# Patient Record
Sex: Female | Born: 1945 | Race: White | Hispanic: No | Marital: Married | Smoking: Never smoker
Health system: Southern US, Community
[De-identification: ages and names within clinical notes are randomized; demographics above are authoritative.]

## PROBLEM LIST (undated history)

## (undated) DIAGNOSIS — I4891 Unspecified atrial fibrillation: Secondary | ICD-10-CM

## (undated) DIAGNOSIS — I499 Cardiac arrhythmia, unspecified: Secondary | ICD-10-CM

## (undated) DIAGNOSIS — J189 Pneumonia, unspecified organism: Secondary | ICD-10-CM

## (undated) DIAGNOSIS — Z8679 Personal history of other diseases of the circulatory system: Secondary | ICD-10-CM

## (undated) DIAGNOSIS — Z9889 Other specified postprocedural states: Secondary | ICD-10-CM

## (undated) DIAGNOSIS — E785 Hyperlipidemia, unspecified: Secondary | ICD-10-CM

## (undated) HISTORY — DX: Unspecified atrial fibrillation: I48.91

## (undated) HISTORY — DX: Personal history of other diseases of the circulatory system: Z86.79

## (undated) HISTORY — PX: LOOP RECORDER INSERTION: EP1214

## (undated) HISTORY — PX: CHOLECYSTECTOMY: SHX55

## (undated) HISTORY — DX: Hyperlipidemia, unspecified: E78.5

## (undated) HISTORY — DX: Other specified postprocedural states: Z98.890

## (undated) HISTORY — PX: PANCREATICODUODENECTOMY: SUR1000

---

## 2005-01-22 ENCOUNTER — Encounter: Payer: Self-pay | Admitting: Cardiology

## 2009-01-02 ENCOUNTER — Encounter: Payer: Self-pay | Admitting: Cardiology

## 2009-01-09 ENCOUNTER — Encounter: Payer: Self-pay | Admitting: Cardiology

## 2009-04-18 ENCOUNTER — Encounter: Payer: Self-pay | Admitting: Cardiology

## 2009-04-18 HISTORY — PX: CARDIAC CATHETERIZATION: SHX172

## 2009-07-30 ENCOUNTER — Encounter: Payer: Self-pay | Admitting: Cardiology

## 2009-07-31 ENCOUNTER — Encounter: Payer: Self-pay | Admitting: Cardiology

## 2009-08-14 ENCOUNTER — Encounter: Payer: Self-pay | Admitting: Cardiology

## 2009-08-18 ENCOUNTER — Telehealth (INDEPENDENT_AMBULATORY_CARE_PROVIDER_SITE_OTHER): Payer: Self-pay | Admitting: *Deleted

## 2009-08-20 DIAGNOSIS — R0602 Shortness of breath: Secondary | ICD-10-CM

## 2009-08-20 DIAGNOSIS — I4891 Unspecified atrial fibrillation: Secondary | ICD-10-CM | POA: Insufficient documentation

## 2009-08-21 ENCOUNTER — Ambulatory Visit: Payer: Self-pay | Admitting: Cardiology

## 2009-08-21 DIAGNOSIS — E785 Hyperlipidemia, unspecified: Secondary | ICD-10-CM

## 2009-08-29 ENCOUNTER — Ambulatory Visit: Payer: Self-pay | Admitting: Internal Medicine

## 2009-08-29 DIAGNOSIS — I4892 Unspecified atrial flutter: Secondary | ICD-10-CM

## 2009-09-03 ENCOUNTER — Encounter: Payer: Self-pay | Admitting: Internal Medicine

## 2009-09-15 ENCOUNTER — Telehealth: Payer: Self-pay | Admitting: Internal Medicine

## 2009-09-25 ENCOUNTER — Telehealth: Payer: Self-pay | Admitting: Internal Medicine

## 2009-10-22 ENCOUNTER — Telehealth: Payer: Self-pay | Admitting: Internal Medicine

## 2009-11-20 ENCOUNTER — Ambulatory Visit: Payer: Self-pay | Admitting: Internal Medicine

## 2009-12-02 ENCOUNTER — Ambulatory Visit: Payer: Self-pay | Admitting: Cardiology

## 2009-12-09 ENCOUNTER — Ambulatory Visit: Payer: Self-pay | Admitting: Cardiology

## 2009-12-16 ENCOUNTER — Ambulatory Visit: Payer: Self-pay | Admitting: Cardiology

## 2009-12-23 ENCOUNTER — Ambulatory Visit: Payer: Self-pay | Admitting: Cardiology

## 2009-12-23 LAB — CONVERTED CEMR LAB: POC INR: 3.8

## 2009-12-26 ENCOUNTER — Encounter: Payer: Self-pay | Admitting: Internal Medicine

## 2009-12-29 ENCOUNTER — Telehealth: Payer: Self-pay | Admitting: Internal Medicine

## 2009-12-30 ENCOUNTER — Encounter: Payer: Self-pay | Admitting: Internal Medicine

## 2009-12-30 ENCOUNTER — Ambulatory Visit: Payer: Self-pay | Admitting: Cardiology

## 2010-01-05 ENCOUNTER — Other Ambulatory Visit: Payer: Self-pay | Admitting: Internal Medicine

## 2010-01-05 ENCOUNTER — Ambulatory Visit: Payer: Self-pay | Admitting: Internal Medicine

## 2010-01-05 ENCOUNTER — Encounter: Payer: Self-pay | Admitting: Internal Medicine

## 2010-01-16 ENCOUNTER — Ambulatory Visit: Payer: Self-pay | Admitting: Cardiology

## 2010-01-23 ENCOUNTER — Ambulatory Visit: Payer: Self-pay | Admitting: Cardiology

## 2010-01-23 LAB — CONVERTED CEMR LAB: POC INR: 2.8

## 2010-01-30 ENCOUNTER — Encounter (INDEPENDENT_AMBULATORY_CARE_PROVIDER_SITE_OTHER): Payer: Self-pay | Admitting: *Deleted

## 2010-01-30 ENCOUNTER — Ambulatory Visit: Payer: Self-pay | Admitting: Cardiology

## 2010-01-30 LAB — CONVERTED CEMR LAB: POC INR: 3

## 2010-02-06 ENCOUNTER — Ambulatory Visit: Payer: Self-pay | Admitting: Cardiology

## 2010-02-09 ENCOUNTER — Encounter: Payer: Self-pay | Admitting: Internal Medicine

## 2010-02-12 ENCOUNTER — Encounter: Payer: Self-pay | Admitting: Internal Medicine

## 2010-02-12 ENCOUNTER — Ambulatory Visit: Payer: Self-pay | Admitting: Cardiology

## 2010-02-12 LAB — CONVERTED CEMR LAB: POC INR: 3.1

## 2010-02-16 ENCOUNTER — Telehealth: Payer: Self-pay | Admitting: Internal Medicine

## 2010-02-16 LAB — CONVERTED CEMR LAB
BUN: 10 mg/dL (ref 6–23)
Creatinine, Ser: 0.84 mg/dL (ref 0.40–1.20)
HCT: 36.3 % (ref 36.0–46.0)
Platelets: 338 10*3/uL (ref 150–400)
Potassium: 4.5 meq/L (ref 3.5–5.3)
RDW: 13.8 % (ref 11.5–15.5)

## 2010-02-20 ENCOUNTER — Inpatient Hospital Stay (HOSPITAL_COMMUNITY): Admission: RE | Admit: 2010-02-20 | Discharge: 2010-02-22 | Payer: Self-pay | Admitting: Internal Medicine

## 2010-02-20 ENCOUNTER — Ambulatory Visit: Payer: Self-pay | Admitting: Cardiology

## 2010-02-20 HISTORY — PX: OTHER SURGICAL HISTORY: SHX169

## 2010-02-21 DIAGNOSIS — Z9889 Other specified postprocedural states: Secondary | ICD-10-CM

## 2010-02-21 DIAGNOSIS — Z8679 Personal history of other diseases of the circulatory system: Secondary | ICD-10-CM

## 2010-02-21 HISTORY — DX: Personal history of other diseases of the circulatory system: Z98.890

## 2010-02-21 HISTORY — DX: Personal history of other diseases of the circulatory system: Z86.79

## 2010-02-27 ENCOUNTER — Ambulatory Visit: Payer: Self-pay | Admitting: Cardiology

## 2010-02-27 LAB — CONVERTED CEMR LAB: POC INR: 1.6

## 2010-03-10 ENCOUNTER — Ambulatory Visit: Payer: Self-pay | Admitting: Cardiology

## 2010-03-10 LAB — CONVERTED CEMR LAB: POC INR: 2.4

## 2010-03-20 ENCOUNTER — Ambulatory Visit: Payer: Self-pay | Admitting: Cardiology

## 2010-04-03 ENCOUNTER — Ambulatory Visit: Payer: Self-pay | Admitting: Cardiology

## 2010-04-06 ENCOUNTER — Telehealth (INDEPENDENT_AMBULATORY_CARE_PROVIDER_SITE_OTHER): Payer: Self-pay | Admitting: *Deleted

## 2010-04-08 ENCOUNTER — Telehealth (INDEPENDENT_AMBULATORY_CARE_PROVIDER_SITE_OTHER): Payer: Self-pay | Admitting: *Deleted

## 2010-04-22 ENCOUNTER — Ambulatory Visit: Payer: Self-pay | Admitting: Internal Medicine

## 2010-05-05 ENCOUNTER — Ambulatory Visit: Payer: Self-pay | Admitting: Cardiology

## 2010-05-05 LAB — CONVERTED CEMR LAB: POC INR: 2.7

## 2010-06-02 ENCOUNTER — Ambulatory Visit: Admission: RE | Admit: 2010-06-02 | Discharge: 2010-06-02 | Payer: Self-pay | Source: Home / Self Care

## 2010-06-02 LAB — CONVERTED CEMR LAB: POC INR: 2.9

## 2010-06-30 ENCOUNTER — Ambulatory Visit: Admission: RE | Admit: 2010-06-30 | Discharge: 2010-06-30 | Payer: Self-pay | Source: Home / Self Care

## 2010-06-30 NOTE — Medication Information (Signed)
Summary: new coumadin per [facility]  --agh  Anticoagulant Therapy  Managed by: Vashti Hey, RN PCP: Radene Gunning (Waylan Rocher) Supervising MD: Antoine Poche MD, Fayrene Fearing Indication 1: Atrial Fibrillation Lab Used: [facility] [place] Site: [place] INR POC 3.8  Dietary changes: no    Health status changes: no    Bleeding/hemorrhagic complications: no    Recent/future hospitalizations: yes       Details: [place] A fib ablation 01/06/10 by Dr Johney Frame  Any changes in medication regimen? yes       Details: Has been on coumadin since last Sept.  Has been checked at [facility] in [place] but will now be managed by [place]  Recent/future dental: no  Any missed doses?: no       Is patient compliant with meds? yes       Allergies: No Known Drug Allergies  Anticoagulation Management History:      The patient is taking warfarin and comes in today for a routine follow up visit.  Negative risk factors for bleeding include an age less than 9 years old.  The bleeding index is 'low risk'.  Negative CHADS2 values include Age > 80 years old.  Anticoagulation responsible provider: Antoine Poche MD, Fayrene Fearing.  INR POC: 3.8.  Cuvette Lot#: [id number].    Anticoagulation Management Assessment/Plan:      The patient's current anticoagulation dose is Coumadin 5 mg tabs: Take 1 tablet by mouth once a day.  The target INR is 2.0-3.0.  The next INR is due 12/09/2009.  Anticoagulation instructions were given to patient.  Results were reviewed/authorized by Vashti Hey, RN.  She was notified by Vashti Hey RN.        Coagulation management information includes: Pending abaltion by Dr Johney Frame on 01/06/10    Prior INR on 11/21/09 in [place] was 2.5.  Current Anticoagulation Instructions: INR 3.8 Decrease coumadin to 5mg  once daily except 2.5mg  on Tuesdays and Fridays Increase greens

## 2010-06-30 NOTE — Medication Information (Signed)
Summary: ccr-lr  Anticoagulant Therapy  Managed by: Vashti Hey, RN PCP: Radene Gunning (Waylan Rocher) Supervising MD: Myrtis Ser MD, Tinnie Gens Indication 1: Atrial Fibrillation Lab Used: [facility] [place] Site: [place] INR POC 3.6  Dietary changes: no    Health status changes: no    Bleeding/hemorrhagic complications: no    Recent/future hospitalizations: no    Any changes in medication regimen? no    Recent/future dental: no  Any missed doses?: no       Is patient compliant with meds? yes       Allergies: No Known Drug Allergies  Anticoagulation Management History:      The patient is taking warfarin and comes in today for a routine follow up visit.  Negative risk factors for bleeding include an age less than 50 years old.  The bleeding index is 'low risk'.  Negative CHADS2 values include Age > 47 years old.  Anticoagulation responsible provider: Myrtis Ser MD, Tinnie Gens.  INR POC: 3.6.  Cuvette Lot#: [id number].    Anticoagulation Management Assessment/Plan:      The patient's current anticoagulation dose is Coumadin 5 mg tabs: Take 1 tablet by mouth once a day.  The target INR is 2.0-3.0.  The next INR is due 02/12/2010.  Anticoagulation instructions were given to patient.  Results were reviewed/authorized by Vashti Hey, RN.  She was notified by Vashti Hey RN.         Prior Anticoagulation Instructions: INR 3.0 Continue coumadin 5mg  once daily except 2.5mg  on Mondays and Fridays  Current Anticoagulation Instructions: INR 3.6 Hold coumadin tonight then resume 5mg  once daily except 2.5mg  on Mondays and Fridays

## 2010-06-30 NOTE — Medication Information (Signed)
Summary: ccr-lr  Anticoagulant Therapy  Managed by: Vashti Hey, RN PCP: Radene Gunning (Waylan Rocher) Supervising MD: Andee Lineman MD, Michelle Piper Indication 1: Atrial Fibrillation Lab Used: [facility]  Site: [place] INR POC 2.2  Dietary changes: no    Health status changes: no    Bleeding/hemorrhagic complications: no    Recent/future hospitalizations: no    Any changes in medication regimen? no    Recent/future dental: no  Any missed doses?: no       Is patient compliant with meds? yes       Allergies: No Known Drug Allergies  Anticoagulation Management History:      The patient is taking warfarin and comes in today for a routine follow up visit.  Negative risk factors for bleeding include an age less than 79 years old.  The bleeding index is 'low risk'.  Negative CHADS2 values include Age > 60 years old.  Anticoagulation responsible Verlie Liotta: Andee Lineman MD, Michelle Piper.  INR POC: 2.2.  Cuvette Lot#: [id number].    Anticoagulation Management Assessment/Plan:      The patient's current anticoagulation dose is Coumadin 5 mg tabs: Take 1 tablet by mouth once a day.  The target INR is 2.0-3.0.  The next INR is due 05/05/2010.  Anticoagulation instructions were given to patient.  Results were reviewed/authorized by Vashti Hey, RN.  She was notified by Vashti Hey RN.         Prior Anticoagulation Instructions: INR 3.0 Continue coumadin 5mg  once daily except 2.5mg  on Mondays and Thursdays  Current Anticoagulation Instructions: INR 2.2 Continue coumadin 5mg  once daily except 2.5mg  on Mondays and Fridays

## 2010-06-30 NOTE — Progress Notes (Signed)
Summary: Pt Calling About Ablation  Phone Note Call from Patient Call back at Home Phone [phone]   Caller: Patient Summary of Call: Pt have want to talk about her ablations that is scheduled for Mayb 10,2011 Initial call taken by: Judie Grieve,  September 25, 2009 2:37 PM  Follow-up for Phone Call        spoke with pt and her MD in [place] started her on Propafenone 225mg  two times a day She feels like this is working and wishes to post pone the ablation for now.  Will cx the procedure.  She will call us back if she wishes to reschedule Dennis Bast, RN, BSN  September 25, 2009 2:57 PM

## 2010-06-30 NOTE — Progress Notes (Signed)
Summary: refill meds  Phone Note Refill Request Call back at Home Phone [phone] Message from:  Patient on April 06, 2010 2:13 PM  Refills Requested: Medication #1:  COUMADIN 5 MG TABS Take 1 tablet by mouth once a day  Medication #2:  METOPROLOL TARTRATE 25 MG TABS Take 1 tablet by mouth once daily  Medication #3:  PROPAFENONE HCL 225 MG TABS Take one tablet by mouth twice daily.  Medication #4:  NITROSTAT 0.4 MG SUBL use as directed [facility] [phone]   Method Requested: Fax to Mail Away Pharmacy Initial call taken by: Lorne Skeens,  April 06, 2010 2:15 PM    Prescriptions: PROPAFENONE HCL 225 MG TABS (PROPAFENONE HCL) Take one tablet by mouth twice daily.  #60 x 5   Entered by:   Carlye Grippe   Authorized by:   Hillis Range, MD   Signed by:   Carlye Grippe on 04/07/2010   Method used:   Electronically to        [facility]* (retail)       [address]       [place], [place]  [address]       Ph: [phone]       Fax: [phone]   RxID:   [id number] NITROSTAT 0.4 MG SUBL (NITROGLYCERIN) use as directed  #25 x 1   Entered by:   Carlye Grippe   Authorized by:   Hillis Range, MD   Signed by:   Carlye Grippe on 04/07/2010   Method used:   Electronically to        [facility]* (retail)       [address]       [place], [place]  [address]       Ph: [phone]       Fax: [phone]   RxID:   [id number] METOPROLOL TARTRATE 25 MG TABS (METOPROLOL TARTRATE) Take 1 tablet by mouth once daily  #30 x 5   Entered by:   Carlye Grippe   Authorized by:   Hillis Range, MD   Signed by:   Carlye Grippe on 04/07/2010   Method used:   Electronically to        [facility]* (retail)       [address]       [place], [place]  [address]       Ph: [phone]       Fax: [phone]   RxID:   [id number] NITROSTAT 0.4 MG SUBL (NITROGLYCERIN) use as directed  #25 x 1   Entered by:    Laurance Flatten CMA   Authorized by:   Hillis Range, MD   Signed by:   Laurance Flatten CMA on 04/06/2010   Method used:   Electronically to        [facility]* (retail)       [address]       [place], [place]  [address]       Ph: [phone]       Fax: [phone]   RxID:   [id number] PROPAFENONE HCL 225 MG TABS (PROPAFENONE HCL) Take one tablet by mouth twice daily.  #60 x 5   Entered by:   Laurance Flatten CMA   Authorized by:   Hillis Range, MD   Signed by:   Laurance Flatten CMA on 04/06/2010   Method used:   Electronically to        [facility]* (retail)       [phone]  [address]       [place], [place]  [address]       Ph: [phone]       Fax: [phone]   RxID:   [id number] METOPROLOL TARTRATE 25 MG TABS (METOPROLOL TARTRATE) Take 1 tablet by mouth once daily  #30 x 5   Entered by:   Laurance Flatten CMA   Authorized by:   Hillis Range, MD   Signed by:   Laurance Flatten CMA on 04/06/2010   Method used:   Electronically to        [facility]* (retail)       [address]       [place], [place]  [address]       Ph: [phone]       Fax: [phone]   RxID:   [id number]

## 2010-06-30 NOTE — Medication Information (Signed)
Summary: ccr-lr  Anticoagulant Therapy  Managed by: Vashti Hey, RN PCP: Radene Gunning (Waylan Rocher) Supervising MD: Diona Browner MD, Remi Deter Indication 1: Atrial Fibrillation Lab Used: [facility] [place] Site: [place] INR POC 1.6  Dietary changes: no    Health status changes: no    Bleeding/hemorrhagic complications: no    Recent/future hospitalizations: yes       Details: In [facility] 02/20/10 - 02/22/10 for ablation  Any changes in medication regimen? no    Recent/future dental: no  Any missed doses?: no       Is patient compliant with meds? yes      Comments: D/C INR 02/22/10 2.75  Allergies: No Known Drug Allergies  Anticoagulation Management History:      The patient is taking warfarin and comes in today for a routine follow up visit.  Negative risk factors for bleeding include an age less than 57 years old.  The bleeding index is 'low risk'.  Negative CHADS2 values include Age > 61 years old.  Anticoagulation responsible provider: Diona Browner MD, Remi Deter.  INR POC: 1.6.  Cuvette Lot#: [id number].    Anticoagulation Management Assessment/Plan:      The patient's current anticoagulation dose is Coumadin 5 mg tabs: Take 1 tablet by mouth once a day.  The target INR is 2.0-3.0.  The next INR is due 03/06/2010.  Anticoagulation instructions were given to patient.  Results were reviewed/authorized by Vashti Hey, RN.  She was notified by Vashti Hey RN.        Coagulation management information includes: S/P ablation by Dr Johney Frame on 02/20/10   .  Prior Anticoagulation Instructions: INR 3.1 Take coumadin 1/2 tablet tonight then resume 1 tablet once daily except 1/2 tablets on Mondays and Thursdays  Current Anticoagulation Instructions: INR 1.6   (1st post ablation) Take coumadin 7.5mg  tonight and tomorrow night then resume 5mg  once daily except 2.5mg  on Mondays and Thursdays

## 2010-06-30 NOTE — Assessment & Plan Note (Signed)
Summary: CVRR clinic    Patient Instructions: 1)  Your physician has recommended that you have an ablation.  Catheter ablation is a medical procedure used to treat some cardiac arrhythmias (irregular heartbeats). During catheter ablation, a long, thin, flexible tube is put into a blood vessel in your groin (upper thigh), or neck. This tube is called an ablation catheter. It is then guided to your heart through the blood vessel. Radiofrequency waves destroy small areas of heart tissue where abnormal heartbeats may cause an arrhythmia to start.  Please see the instruction sheet given to you today. 2)  You have been referred to Coumadin Clinic in [place]   Other Orders: Misc. Referral (Misc. Ref)

## 2010-06-30 NOTE — Medication Information (Signed)
Summary: ccr-lr  Anticoagulant Therapy  Managed by: Vashti Hey, RN PCP: Radene Gunning (Waylan Rocher) Supervising MD: Andee Lineman MD, Michelle Piper Indication 1: Atrial Fibrillation Lab Used: [facility] [place] Site: [place] INR POC 3.1           Allergies: No Known Drug Allergies  Anticoagulation Management History:      The patient is taking warfarin and comes in today for a routine follow up visit.  Negative risk factors for bleeding include an age less than 87 years old.  The bleeding index is 'low risk'.  Negative CHADS2 values include Age > 24 years old.  Anticoagulation responsible Abdulai Blaylock: Andee Lineman MD, Michelle Piper.  INR POC: 3.1.  Cuvette Lot#: [id number].    Anticoagulation Management Assessment/Plan:      The patient's current anticoagulation dose is Coumadin 5 mg tabs: Take 1 tablet by mouth once a day.  The target INR is 2.0-3.0.  The next INR is due 01/23/2010.  Anticoagulation instructions were given to patient.  Results were reviewed/authorized by Vashti Hey, RN.  She was notified by Vashti Hey RN.        Coagulation management information includes: Pending abaltion by Dr Johney Frame   Had to be rescheduled    Prior INR on 11/21/09 in [place] was 2.5.  Prior Anticoagulation Instructions: INR 3.0 Decrease coumadin 5mg  once daily except 2.5mg  on Mondays and Fridays Pt sent to [facility] for pre-op labs  Current Anticoagulation Instructions: INR 3.1 Continue coumadin 5mg  once daily except 2.5mg  on Mondays and Fridays Increase greens a little Still pending ablation

## 2010-06-30 NOTE — Medication Information (Signed)
Summary: ccr-lr  Anticoagulant Therapy  Managed by: Vashti Hey, RN PCP: Radene Gunning (Waylan Rocher) Supervising MD: Diona Browner MD, Remi Deter Indication 1: Atrial Fibrillation Lab Used: [facility]  Site: [place] INR POC 3.0  Dietary changes: no    Health status changes: no    Bleeding/hemorrhagic complications: no    Recent/future hospitalizations: no    Any changes in medication regimen? yes       Details: pending ablation  Recent/future dental: no  Any missed doses?: no       Is patient compliant with meds? yes       Allergies: No Known Drug Allergies  Anticoagulation Management History:      Negative risk factors for bleeding include an age less than 70 years old.  The bleeding index is 'low risk'.  Negative CHADS2 values include Age > 66 years old.  Anticoagulation responsible Maksim Peregoy: Diona Browner MD, Remi Deter.  INR POC: 3.0.    Anticoagulation Management Assessment/Plan:      The patient's current anticoagulation dose is Coumadin 5 mg tabs: Take 1 tablet by mouth once a day.  The target INR is 2.0-3.0.  The next INR is due 02/06/2010.  Anticoagulation instructions were given to patient.  Results were reviewed/authorized by Vashti Hey, RN.  She was notified by Vashti Hey RN.         Prior Anticoagulation Instructions: INR 2.8 Continue coumadin 5mg  once daily except 2.5mg  on Mondays and Fridays  Current Anticoagulation Instructions: INR 3.0 Continue coumadin 5mg  once daily except 2.5mg  on Mondays and Fridays

## 2010-06-30 NOTE — Medication Information (Signed)
Summary: ccr-lr  Anticoagulant Therapy  Managed by: Vashti Hey, RN PCP: Radene Gunning (Waylan Rocher) Supervising MD: Diona Browner MD, Remi Deter Indication 1: Atrial Fibrillation Lab Used: [facility] [place] Site: [place] INR POC 3.8  Dietary changes: no    Health status changes: no    Bleeding/hemorrhagic complications: no    Recent/future hospitalizations: no    Any changes in medication regimen? no    Recent/future dental: no  Any missed doses?: no       Is patient compliant with meds? yes       Allergies: No Known Drug Allergies  Anticoagulation Management History:      The patient is taking warfarin and comes in today for a routine follow up visit.  Negative risk factors for bleeding include an age less than 53 years old.  The bleeding index is 'low risk'.  Negative CHADS2 values include Age > 82 years old.  Anticoagulation responsible provider: Diona Browner MD, Remi Deter.  INR POC: 3.8.  Cuvette Lot#: [id number].    Anticoagulation Management Assessment/Plan:      The patient's current anticoagulation dose is Coumadin 5 mg tabs: Take 1 tablet by mouth once a day.  The target INR is 2.0-3.0.  The next INR is due 12/30/2009.  Anticoagulation instructions were given to patient.  Results were reviewed/authorized by Vashti Hey, RN.  She was notified by Vashti Hey RN.         Prior Anticoagulation Instructions: INR 2.8 Continue coumadin 5mg  once daily except 2.5mg  on Fridays  Current Anticoagulation Instructions: INR 3.8 Hold coumadin tonight then resume 5mg  once daily except 2.5mg  on Fridays.

## 2010-06-30 NOTE — Medication Information (Signed)
Summary: ccr-lr  Anticoagulant Therapy  Managed by: Vashti Hey, RN PCP: Radene Gunning (Waylan Rocher) Supervising MD: Andee Lineman MD, Michelle Piper Indication 1: Atrial Fibrillation Lab Used: [facility] [place] Site: [place] INR POC 3.0  Dietary changes: no    Health status changes: no    Bleeding/hemorrhagic complications: no    Recent/future hospitalizations: yes       Details: pending ablation 01/06/10 Dr Johney Frame  Any changes in medication regimen? no    Recent/future dental: no  Any missed doses?: no       Is patient compliant with meds? yes       Allergies: No Known Drug Allergies  Anticoagulation Management History:      The patient is taking warfarin and comes in today for a routine follow up visit.  Negative risk factors for bleeding include an age less than 17 years old.  The bleeding index is 'low risk'.  Negative CHADS2 values include Age > 33 years old.  Anticoagulation responsible provider: Andee Lineman MD, Michelle Piper.  INR POC: 3.0.  Cuvette Lot#: [id number].    Anticoagulation Management Assessment/Plan:      The patient's current anticoagulation dose is Coumadin 5 mg tabs: Take 1 tablet by mouth once a day.  The target INR is 2.0-3.0.  The next INR is due 01/09/2010.  Anticoagulation instructions were given to patient.  Results were reviewed/authorized by Vashti Hey, RN.  She was notified by Vashti Hey RN.         Prior Anticoagulation Instructions: INR 3.8 Hold coumadin tonight then resume 5mg  once daily except 2.5mg  on Fridays.  Current Anticoagulation Instructions: INR 3.0 Decrease coumadin 5mg  once daily except 2.5mg  on Mondays and Fridays Pt sent to [facility] for pre-op labs

## 2010-06-30 NOTE — Progress Notes (Signed)
Summary: Coumadin Rx  Phone Note Call from Patient   Caller: Patient Reason for Call: Refill Medication Summary of Call: pt needs RX for Coumadin called in to [facility] in [place], [place] / phone number is [phone] / tg Initial call taken by: Raechel Ache [facility],  April 08, 2010 9:59 AM  Follow-up for Phone Call        Coumadin Rx refilled as requested. Follow-up by: Vashti Hey RN,  April 08, 2010 10:14 AM    Prescriptions: COUMADIN 5 MG TABS (WARFARIN SODIUM) Take 1 tablet by mouth once a day  #30 x 3   Entered by:   Vashti Hey RN   Authorized by:   Ferman Hamming, MD, [facility]   Signed by:   Vashti Hey RN on 04/08/2010   Method used:   Electronically to        [facility]* (retail)       [address]       [place], [place]  [address]       Ph: [phone]       Fax: [phone]   RxID:   [id number]

## 2010-06-30 NOTE — Letter (Signed)
Summary: ELectrophysiology/Ablation Procedure Instructions  [facility], Main Office  [address]   [place], [place] [address]   Phone: [phone]  Fax: [phone]     Electrophysiology/Ablation Procedure Instructions    You are scheduled for a(n) afib ablation  on 01/06/2010 at 7:30am with Dr. Johney Frame.  1.  Please come to the Short Stay Center at [facility] at 5:30am on the day of your procedure.  2.  Come prepared to stay overnight.   Please bring your insurance cards and a list of your medications.  3.  Come to the [facility] on 12/30/2009 at  ____________ for lab work.  You do not have to be fasting.  4.  Do not have anything to eat or drink after midnight the night before your procedure.  5.  Do NOT take these medications for the morning of procedure unless otherwise instructed:  Metoprolol.  All of your remaining medications may be taken with a small amount of water.    * Occasionally, EP studies and ablations can become lengthy.  Please make your family aware of this before your procedure starts.  Average time ranges from 2-8 hours for EP studies/ablations.  Your physician will locate your family after the procedure with the results.  * If you have any questions after you get home, please call the office at [phone]. Anselm Pancoast  TEE  on 01/05/2010  will call with all instructions Have weekly INR's in [place] at our office

## 2010-06-30 NOTE — Medication Information (Signed)
Summary: ccr-lr  Anticoagulant Therapy  Managed by: Jessica Hey, RN PCP: Jessica Fowler (Waylan Rocher) Supervising Fowler: Jessica Fowler, Jessica Fowler Indication 1: Atrial Fibrillation Lab Used: [facility]  Site: [place] INR POC 2.7  Dietary changes: no    Health status changes: no    Bleeding/hemorrhagic complications: no    Recent/future hospitalizations: no    Any changes in medication regimen? no    Recent/future dental: no  Any missed doses?: no       Is patient compliant with meds? yes       Allergies: No Known Drug Allergies  Anticoagulation Management History:      The patient is taking warfarin and comes in today for a routine follow up visit.  Negative risk factors for bleeding include an age less than 43 years old.  The bleeding index is 'low risk'.  Negative CHADS2 values include Age > 19 years old.  Anticoagulation responsible provider: Antoine Poche Fowler, Jessica Fowler.  INR POC: 2.7.  Cuvette Lot#: [id number].    Anticoagulation Management Assessment/Plan:      The patient's current anticoagulation dose is Coumadin 5 mg tabs: Take 1 tablet by mouth once a day.  The target INR is 2.0-3.0.  The next INR is due 06/02/2010.  Anticoagulation instructions were given to patient.  Results were reviewed/authorized by Jessica Hey, RN.  She was notified by Jessica Hey RN.         Prior Anticoagulation Instructions: INR 2.2 Continue coumadin 5mg  once daily except 2.5mg  on Mondays and Fridays  Current Anticoagulation Instructions: INR 2.7 Continue coumadin 5mg  once daily except 2.5mg  on Mondays and Thursdays

## 2010-06-30 NOTE — Assessment & Plan Note (Signed)
Summary: 4 wk eph/mt   Visit Type:  4 week eph Referring Provider:  Dr Jens Som Primary Provider:  Radene Gunning ([place])  CC:  chest pain and SOB.  History of Present Illness: The patient presents today for routine electrophysiology followup. She reports doing very well since her afib ablation.  She has had no further episodes of afib.  She continues to notice occasional sharp lacenating chest pains as well as a "fullness" in her L breast.  This was present before ablation and has not woresened.   She denies exertional chest pain.  I have reviewed her normal cath from 11/10 today.  The patient denies symptoms of palpitations,  shortness of breath, orthopnea, PND, lower extremity edema, dizziness, presyncope, syncope, or neurologic sequela. The patient is tolerating medications without difficulties and is otherwise without complaint today.   Problems Prior to Update: 1)  Atrial Flutter  (ICD-427.32) 2)  Hyperlipidemia  (ICD-272.4) 3)  Shortness of Breath  (ICD-786.05) 4)  Atrial Fibrillation  (ICD-427.31) 5)  Encounter For Long-term Use of Other Medications  (ICD-V58.69)  Medications Prior to Update: 1)  Coumadin 5 Mg Tabs (Warfarin Sodium) .... Take 1 Tablet By Mouth Once A Day 2)  Metoprolol Tartrate 25 Mg Tabs (Metoprolol Tartrate) .... Take 1 Tablet By Mouth Once Daily 3)  Oscal 500/200 D-3 500-200 Mg-Unit Tabs (Calcium-Vitamin D) .... Take 1 Tablet By Mouth Three Times A Day 4)  Nitrostat 0.4 Mg Subl (Nitroglycerin) .... Use As Directed 5)  Propafenone Hcl 225 Mg Tabs (Propafenone Hcl) .... Take One Tablet By Mouth Twice Daily. 6)  Cyanocobalamin 1000 Mcg/ml Soln (Cyanocobalamin) .... Monthly  Current Medications (verified): 1)  Coumadin 5 Mg Tabs (Warfarin Sodium) .... Take 1 Tablet By Mouth Once A Day 2)  Metoprolol Tartrate 25 Mg Tabs (Metoprolol Tartrate) .... Take 1 Tablet By Mouth Once Daily 3)  Oscal 500/200 D-3 500-200 Mg-Unit Tabs (Calcium-Vitamin D) .... Take 1 Tablet By  Mouth Three Times A Day 4)  Nitrostat 0.4 Mg Subl (Nitroglycerin) .... Use As Directed 5)  Propafenone Hcl 225 Mg Tabs (Propafenone Hcl) .... Take One Tablet By Mouth Twice Daily. 6)  Cyanocobalamin 1000 Mcg/ml Soln (Cyanocobalamin) .... Monthly  Allergies (verified): No Known Drug Allergies  Past History:  Past Medical History: Atrial fibrillation, atrial flutter s/p ablation 02/21/10 HL allergic rhinitis denies h/o rheumatic fever  cath 04/18/09- normal coronary arteries, EF 60%, 1+ MR EP study 8/06- negative  Past Surgical History: THYROID SURGERY TO REMOVE NODULE BREAST BIOPSY- benign Cholecystectomy afib and atrial flutter ablation by JA 02/21/10  Vital Signs:  Patient profile:   65 year old female Height:      65.5 inches Weight:      150.50 pounds BMI:     24.75 Pulse rate:   59 / minute BP sitting:   102 / 56  (left arm) Cuff size:   regular  Vitals Entered By: Caralee Ates CMA (April 22, 2010 11:02 AM)  Physical Exam  General:  Well developed, well nourished, in no acute distress. Head:  normocephalic and atraumatic Eyes:  PERRLA/EOM intact; conjunctiva and lids normal. Mouth:  Teeth, gums and palate normal. Oral mucosa normal. Neck:  Neck supple, no JVD. No masses, thyromegaly or abnormal cervical nodes. Chest Wall:  TTP over the L chest wall which reproduces her pain Lungs:  Clear bilaterally to auscultation and percussion. Heart:  Non-displaced PMI, chest non-tender; regular rate and rhythm, S1, S2 without murmurs, rubs or gallops. Carotid upstroke normal, no bruit. Normal  abdominal aortic size, no bruits. Femorals normal pulses, no bruits. Pedals normal pulses. No edema, no varicosities. Abdomen:  Bowel sounds positive; abdomen soft and non-tender without masses, organomegaly, or hernias noted. No hepatosplenomegaly. Msk:  Back normal, normal gait. Muscle strength and tone normal. Pulses:  pulses normal in all 4 extremities Extremities:  No clubbing or  cyanosis. Neurologic:  Alert and oriented x 3. Psych:  anxious   EKG  Procedure date:  04/22/2010  Findings:      sinus rhythm 60 bpm, otherwise normal ekg  Impression & Recommendations:  Problem # 1:  ATRIAL FIBRILLATION (ICD-427.31) doing very well s/p ablation without recurrence we will stop propafenone today continue metoprolol and coumadin   She has atypical chest pain which is likely musculoskeletal.  I have advised her to follow-up with PCP for regular breast exam and follow-up.  Cath 11/10 was normal.  This is unlikely to be cardiac.  Patient Instructions: 1)  Your physician wants you to follow-up in: 6 months with Dr Johney Frame  Bonita Quin will receive a reminder letter in the mail two months in advance. If you don't receive a letter, please call our office to schedule the follow-up appointment. 2)  Your physician has recommended you make the following change in your medication: stop Propafenone

## 2010-06-30 NOTE — Progress Notes (Signed)
Summary: PT results little over 2, result from [facility] in [place]  Phone Note Call from Patient Call back at [facility] Phone [phone] Call back at [phone]   Caller: Patient Reason for Call: Talk to Nurse Summary of Call: pt received pt/inr results from [facility] [place] -little over 2 . office  # [phone] Initial call taken by: Lorne Skeens,  September 15, 2009 3:10 PM  Follow-up for Phone Call        Left message to call back. Julieta Gutting, RN, BSN  September 15, 2009 3:31 PM pt rtn your call Omer Jack  September 15, 2009 3:45 PM   I spoke with the patient and she said that her physician in [place] got upset with her for seeing Dr Johney Frame.  The pt is worried that they will not fax her lab results to our office.  I told the pt that she has the right to see whomever she wants regarding her healthcare.  I told the pt that we would wait and see if that office faxed her lab results to our office.  The pt provided [place] with the phone number [phone] and fax number [phone] to [facility].  Follow-up by: Julieta Gutting, RN, BSN,  September 15, 2009 4:03 PM

## 2010-06-30 NOTE — Medication Information (Signed)
Summary: ccr-lr  Anticoagulant Therapy  Managed by: Vashti Hey, RN PCP: Radene Gunning (Waylan Rocher) Supervising MD: Andee Lineman MD, Michelle Piper Indication 1: Atrial Fibrillation Lab Used: [facility] [place] Site: [place] INR POC 2.8  Dietary changes: no    Health status changes: no    Bleeding/hemorrhagic complications: no    Recent/future hospitalizations: yes       Details: pending ablation  Any changes in medication regimen? no    Recent/future dental: no  Any missed doses?: no       Is patient compliant with meds? yes       Allergies: No Known Drug Allergies  Anticoagulation Management History:      The patient is taking warfarin and comes in today for a routine follow up visit.  Negative risk factors for bleeding include an age less than 66 years old.  The bleeding index is 'low risk'.  Negative CHADS2 values include Age > 21 years old.  Anticoagulation responsible Valerian Jewel: Andee Lineman MD, Michelle Piper.  INR POC: 2.8.  Cuvette Lot#: [id number].    Anticoagulation Management Assessment/Plan:      The patient's current anticoagulation dose is Coumadin 5 mg tabs: Take 1 tablet by mouth once a day.  The target INR is 2.0-3.0.  The next INR is due 01/30/2010.  Anticoagulation instructions were given to patient.  Results were reviewed/authorized by Vashti Hey, RN.  She was notified by Vashti Hey RN.         Prior Anticoagulation Instructions: INR 3.1 Continue coumadin 5mg  once daily except 2.5mg  on Mondays and Fridays Increase greens a little Still pending ablation  Current Anticoagulation Instructions: INR 2.8 Continue coumadin 5mg  once daily except 2.5mg  on Mondays and Fridays

## 2010-06-30 NOTE — Letter (Signed)
Summary: ELectrophysiology/Ablation Procedure Instructions  [facility], Main Office  [address]   [place], [place] [address]   Phone: [phone]  Fax: [phone]     Electrophysiology/Ablation Procedure Instructions    You are scheduled for a(n)  a-fib ablation on 10/07/09 at 7:30am with Dr. Johney Frame.  1.  Please come to the Short Stay Center at [facility] at 5:30am on the day of your procedure.  2.  Come prepared to stay overnight.   Please bring your insurance cards and a list of your medications.  3.  Come to the [place] office on 09/30/09 at [facility]  for lab work.  Also continue to obtain weekly INR's and have them faxed to Anselm Pancoast [phone] You do not have to be fasting.  4.  Do not have anything to eat or drink after midnight the night before your procedure.  5.  Do NOT take these medications for the day of your procedure unless otherwise instructed:  Metoprolol.  All of your remaining medications may be taken with a small amount of water.  6.  Educational material received:   Ablation   * Occasionally, EP studies and ablations can become lengthy.  Please make your family aware of this before your procedure starts.  Average time ranges from 2-8 hours for EP studies/ablations.  Your physician will locate your family after the procedure with the results.  * If you have any questions after you get home, please call the office at [phone]. Anselm Pancoast   TEE-on 5-9/11 Please be at Short Stay at [facility] at 10:30am.  Nothing to eat or drink after midnight the night before.

## 2010-06-30 NOTE — Assessment & Plan Note (Signed)
Summary: NEW HOSP FU/ D/C [facility] 07/31/2009   Visit Type:  Initial Consult Primary Provider:  Molly Maduro Burh,MD ([place])  CC:  a-fib.  History of Present Illness: 65 yo female for evaluation of atrial fibrillation. The patient's first episode of atrial fibrillation apparently occurred in 2006. She has been cared for at [facility]. She states these episodes are sudden in onset and they're described as her heart "pounding". This is associated with chest pain described as an elephant sitting on her chest and a burning sensation. There is also shortness of breath and dizziness but there is no frank syncope. Note she did have a cardiac catheterization on April 18, 2009 in [place]. Ejection fraction was 60%. There was 1+ mitral regurgitation. There was no coronary disease noted. An echocardiogram performed in September of 2010 showed an ejection fraction of 60-65%. There was a trace pericardial effusion. There was mild pulmonic insufficiency, moderate tricuspid regurgitation, and trace mitral regurgitation. She has been treated with increasing doses of sotalol in the past and is now on flecainide. However she is having breakthrough atrial fibrillation despite these. She otherwise does not have exertional chest pain, orthopnea, PND, pedal edema. She has mild dyspnea on exertion. Because of her atrial fibrillation we were asked to further evaluate. also note a recent TSH was normal at 4.120.  Preventive Screening-Counseling & Management  Alcohol-Tobacco     Smoking Status: never  Current Medications (verified): 1)  Coumadin 5 Mg Tabs (Warfarin Sodium) .... Take 1 Tablet By Mouth Once A Day 2)  Atenolol 50 Mg Tabs (Atenolol) .... Take 1/2 Tablet By Mouth Two Times A Day 3)  Flecainide Acetate 50 Mg Tabs (Flecainide Acetate) .... Take 2 Tablet By Mouth Two Times A Day 4)  Metoprolol Tartrate 25 Mg Tabs (Metoprolol Tartrate) .... Take One By Mouth Every Six Hours As Needed 5)  Oscal 500/200  D-3 500-200 Mg-Unit Tabs (Calcium-Vitamin D) .... Take 1 Tablet By Mouth Three Times A Day 6)  Nitrostat 0.4 Mg Subl (Nitroglycerin) .... Use As Directed  Allergies (verified): No Known Drug Allergies  Comments:  Nurse/Medical Assistant: The patient's medications and allergies were reviewed with the patient and were updated in the Medication and Allergy Lists. Bottles reviewed.  Past History:  Past Medical History: Hyperlipidemia Atrial fibrillation  Past Surgical History: THYROID SURGERY TO REMOVE NODULE BREAST BIOPSY Cholecystectomy  Family History: Reviewed history from 08/20/2009 and no changes required. Negative for premature CAD or significant cancer Father had colon cancer  Mother with H/O CHF  Social History: Reviewed history from 08/20/2009 and no changes required. works part time has 2 grown children no history of smoking or alcohol use Married  Smoking Status:  never  Review of Systems       no fevers or chills, productive cough, hemoptysis, dysphasia, odynophagia, melena, hematochezia, dysuria, hematuria, rash, seizure activity, orthopnea, PND, pedal edema, claudication. Remaining systems are negative.   Vital Signs:  Patient profile:   65 year old female Height:      65.5 inches Weight:      145 pounds BMI:     23.85 Pulse rate:   60 / minute BP sitting:   126 / 74  (left arm) Cuff size:   regular  Vitals Entered By: Carlye Grippe (August 21, 2009 9:57 AM) CC: a-fib   Physical Exam  General:  Well developed/well nourished in NAD Skin warm/dry Patient not depressed No peripheral clubbing Back-normal HEENT-normal/normal eyelids Neck supple/normal carotid upstroke bilaterally; no bruits; no JVD; no  thyromegaly chest - CTA/ normal expansion CV - RRR/normal S1 and S2; no murmurs, rubs or gallops;  PMI nondisplaced Abdomen -NT/ND, no HSM, no mass, + bowel sounds, no bruit 2+ femoral pulses, no bruits Ext-no edema, chords, 2+ DP Neuro-grossly  nonfocal     Impression & Recommendations:  Problem # 1:  ATRIAL FIBRILLATION (ICD-427.31) The patient is having atrial fibrillation that is recurrent despite attempts with sotalol and flecainide. These episodes are frequent and lifestyle limiting. I've asked her to discontinue her atenolol. She will continue with her metoprolol at 25 mg p.o. b.i.d. She states her systolic pressure is running between 90 and 100 and there have been times when it less than 90. I therefore cannot increase her metoprolol. She will continue on her flecainide and Coumadin for now. I will refer her to Dr. Johney Frame for consideration of atrial fibrillation ablation. I spent significant amount of time today discussing this with her including other options. She is agreeable. Note she has no embolic risk factors other than female sex. She will continue on her Coumadin for now in anticipation of ablation. However this will most likely be discontinued in the future. The following medications were removed from the medication list:    Atenolol 50 Mg Tabs (Atenolol) .Marland Kitchen... Take 1/2 tablet by mouth two times a day Her updated medication list for this problem includes:    Coumadin 5 Mg Tabs (Warfarin sodium) .Marland Kitchen... Take 1 tablet by mouth once a day    Flecainide Acetate 50 Mg Tabs (Flecainide acetate) .Marland Kitchen... Take 2 tablet by mouth two times a day    Metoprolol Tartrate 25 Mg Tabs (Metoprolol tartrate) .Marland Kitchen... Take 1 tablet by mouth two times a day and as needed  Orders: EKG w/ Interpretation (93000)  Problem # 2:  HYPERLIPIDEMIA (ICD-272.4) Management per primary care.  Patient Instructions: 1)  Take Metoprolol 25mg  by mouth two times a day and as needed. 2)  Stop Atenolol. 3)  You have been referred to Dr. Johney Frame. You are scheduled for an appointment with him in [place] on Friday, April 1st at 3 pm.

## 2010-06-30 NOTE — Letter (Signed)
Summary: Appointment -missed  [facility] [address]   [place], [place] [address]   Phone: [phone]  Fax: [phone]     January 30, 2010 MRN: [id number]     JERZIE BIERI [address] [place], [place]  [address]     Dear Ms. Marple,  Our records indicate you missed your appointment on January 30, 2010                        with [facility].   It is very important that we reach you to reschedule this appointment. We look forward to participating in your health care needs.   Please contact us at the number listed above at your earliest convenience to reschedule this appointment.   Sincerely,    Glass blower/designer

## 2010-06-30 NOTE — Miscellaneous (Signed)
Summary: Orders Update  Clinical Lists Changes  Orders: Added new Test order of T-Basic Metabolic Panel ([id number]) - Signed Added new Test order of T-CBC w/Diff (85025-10010) - Signed Added new Test order of T-Protime, Auto ([id number]) - Signed Added new Test order of T-PTT ([id number]) - Signed 

## 2010-06-30 NOTE — Assessment & Plan Note (Signed)
Summary: rov/discuss ablation   Visit Type:  Follow-up Referring Provider:  Dr Jens Som Primary Provider:  Radene Gunning [facility])   History of Present Illness: Jessica Fowler is a pleasant 65 yo WF with a h/o paroxysmal atrial fibrillation who presents today for EP follow-up.  She continues to have symptmatic afib, despite medical therapy.   She has failed medical therapy with both sotalol and flecainide. She now appears to have failed medical therapy with propafenone.   She presently reports symptoms of afib and palpitations every 2-3 weeks and feels that this is progressing. She reports occasional afib triggered by drinking cold water.  She also reports that being in extreme heat with bring on episodes.  During her afib, she reports symptoms of palpitations, "heart pounding", chest discomfort, SOB, and fatigue.  She denies presyncope or syncope.  She feels "washed out" after.   She otherwise does not have exertional chest pain, orthopnea, PND, pedal edema. She has mild dyspnea on exertion.  Current Medications (verified): 1)  Coumadin 5 Mg Tabs (Warfarin Sodium) .... Take 1 Tablet By Mouth Once A Day 2)  Metoprolol Tartrate 25 Mg Tabs (Metoprolol Tartrate) .... Take 1 Tablet By Mouth Once Daily 3)  Oscal 500/200 D-3 500-200 Mg-Unit Tabs (Calcium-Vitamin D) .... Take 1 Tablet By Mouth Three Times A Day 4)  Nitrostat 0.4 Mg Subl (Nitroglycerin) .... Use As Directed 5)  Propafenone Hcl 225 Mg Tabs (Propafenone Hcl) .... Take One Tablet By Mouth Twice Daily. 6)  Cyanocobalamin 1000 Mcg/ml Soln (Cyanocobalamin) .... Monthly  Allergies (verified): No Known Drug Allergies  Past History:  Past Medical History: Reviewed history from 08/29/2009 and no changes required. Atrial fibrillation, atrial flutter HL allergic rhinitis denies h/o rheumatic fever  cath 04/18/09- normal coronary arteries, EF 60%, 1+ MR EP study 8/06- negative  Past Surgical History: Reviewed history from 08/29/2009 and no  changes required. THYROID SURGERY TO REMOVE NODULE BREAST BIOPSY- benign Cholecystectomy  Family History: Reviewed history from 08/21/2009 and no changes required. Negative for premature CAD or significant cancer Father had colon cancer  Mother with H/O CHF  Social History: Reviewed history from 08/29/2009 and no changes required. 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  Review of Systems       All systems are reviewed and negative except as listed in the HPI.   Vital Signs:  Patient profile:   65 year old female Height:      65.5 inches Weight:      144 pounds BMI:     23.68 Pulse rate:   54 / minute BP sitting:   120 / 80  (left arm)  Vitals Entered By: Jessica Fowler CMA (November 20, 2009 10:55 AM)  Physical Exam  General:  Well developed, well nourished, in no acute distress. Head:  normocephalic and atraumatic Eyes:  PERRLA/EOM intact; conjunctiva and lids normal. Mouth:  Teeth, gums and palate normal. Oral mucosa normal. Neck:  Neck supple, no JVD. No masses, thyromegaly or abnormal cervical nodes. Lungs:  Clear bilaterally to auscultation and percussion. Heart:  Non-displaced PMI, chest non-tender; regular rate and rhythm, S1, S2 without murmurs, rubs or gallops. Carotid upstroke normal, no bruit. Normal abdominal aortic size, no bruits. Femorals normal pulses, no bruits. Pedals normal pulses. No edema, no varicosities. Abdomen:  Bowel sounds positive; abdomen soft and non-tender without masses, organomegaly, or hernias noted. No hepatosplenomegaly. Msk:  Back normal,  normal gait. Muscle strength and tone normal. Pulses:  pulses normal in all 4 extremities Extremities:  No clubbing or cyanosis. Neurologic:  Alert and oriented x 3. Skin:  Intact without lesions or rashes. Psych:  Normal affect.   EKG  Procedure date:  11/20/2009  Findings:      sinus bradycardia  54 bpm, otherwise normal ekg  Impression & Recommendations:  Problem # 1:  ATRIAL FIBRILLATION (ICD-427.31) The patient has very symptomatic paroxysmal atrial fibrillation.   She has potential for vagal mediation of her afib.  In addition, I have revealed ekgs from her recent hospitalization which reveal at times a regular tachycardia, likely atrial flutter.  She has failed medical therapy with propafenone, sotalol and flecainide.  Her CHADS2 score is 1 and she is appropriately anticoagulated with coumadin.  Therapeutic strategies for afib including medicine and ablation were discussed in detail with the patient today. Risk, benefits, and alternatives to EP study and radiofrequency ablation were also discussed in detail today. These risks include but are not limited to stroke, bleeding, vascular damage, tamponade, perforation, damage to the esophagus, lungs, and other structures, pulmonary vein stenosis, worsening renal function, and death. The patient understands these risk and wishes to proceed.   We will therefore schedule afib and atrial flutter ablation at the next available time.

## 2010-06-30 NOTE — Progress Notes (Signed)
Summary: Question about procedure  Phone Note Call from Patient Call back at [phone]   Caller: Patient Summary of Call: Pt calling with question about procedure thats scheduled for 02/20/10 Initial call taken by: Judie Grieve,  February 16, 2010 10:31 AM  Follow-up for Phone Call        lmom to call me back Dennis Bast, RN, BSN  February 16, 2010 11:20 AM will do H&P in hospital prior to ablation  pt aware Dennis Bast, RN, BSN  February 16, 2010 5:37 PM

## 2010-06-30 NOTE — Progress Notes (Signed)
Summary: Requesting appt before Thursday  Phone Note Call from Patient Call back at [facility] Phone [phone]   Summary of Call: Pt has post hospital follow up appt scheduled with Dr. Jens Som on Thursday at 10am. Her husband called today stating she's having attacks of a.fib and wants to know if he can see her sooner. Notified pt's husband that Dr. Jens Som is not in the office until Thursday. Notified him that pt should be taken to ER for evaluation of problems before Thursday's appt. Pt verbalized understanding.  Initial call taken by: Cyril Loosen, RN, BSN,  August 18, 2009 2:44 PM

## 2010-06-30 NOTE — Medication Information (Signed)
Summary: ccr-lr  Anticoagulant Therapy  Managed by: Vashti Hey, RN PCP: Radene Gunning (Waylan Rocher) Supervising MD: Andee Lineman MD, Michelle Piper Indication 1: Atrial Fibrillation Lab Used: [facility] [place] Site: [place] INR POC 2.2  Dietary changes: no    Health status changes: no    Bleeding/hemorrhagic complications: no    Recent/future hospitalizations: yes       Details: pending ablation by Dr Johney Frame  Any changes in medication regimen? no    Recent/future dental: no  Any missed doses?: no       Is patient compliant with meds? yes       Allergies: No Known Drug Allergies  Anticoagulation Management History:      The patient is taking warfarin and comes in today for a routine follow up visit.  Negative risk factors for bleeding include an age less than 51 years old.  The bleeding index is 'low risk'.  Negative CHADS2 values include Age > 41 years old.  Anticoagulation responsible provider: Andee Lineman MD, Michelle Piper.  INR POC: 2.2.  Cuvette Lot#: [id number].    Anticoagulation Management Assessment/Plan:      The patient's current anticoagulation dose is Coumadin 5 mg tabs: Take 1 tablet by mouth once a day.  The target INR is 2.0-3.0.  The next INR is due 12/16/2009.  Anticoagulation instructions were given to patient.  Results were reviewed/authorized by Vashti Hey, RN.  She was notified by Vashti Hey RN.         Prior Anticoagulation Instructions: INR 3.8 Decrease coumadin to 5mg  once daily except 2.5mg  on Tuesdays and Fridays Increase greens  Current Anticoagulation Instructions: INR 2.2 Increase coumadin to 5mg  once daily except 2.5mg  on Fridays

## 2010-06-30 NOTE — Progress Notes (Signed)
Summary: question re procedure  Phone Note Call from Patient   Caller: Patient Reason for Call: Talk to Nurse Summary of Call: pt to have a procedure 8-9 and was told she was to have another procedure first re checking for blood clots but hasn't been called re setting it up-pls call [phone] after 330p Initial call taken by: Glynda Jaeger,  December 29, 2009 2:05 PM  Follow-up for Phone Call        TEE with Dr Tenny Craw on 01/05/10 at 11:00  Be at hospital at 10:00am  NPO after midnight. Dennis Bast, RN, BSN  December 29, 2009 2:27 PM spoke with husband Dennis Bast, RN, BSN  December 29, 2009 4:33 PM

## 2010-06-30 NOTE — Assessment & Plan Note (Signed)
Summary: nep/atrial flutter   Visit Type:  Initial Consult Referring Provider:  Dr Jens Som Primary Provider:  Radene Gunning ([place])  CC:  afib.  History of Present Illness: Ms Jessica Fowler is a pleasant 65 yo WF with a h/o paroxysmal atrial fibrillation who presents today for EP consultation.  She reports initially being diagnosed with atrial fibrillation in 2006 after presenting with afib with RVR.  In retrospect, she feels that she has had afib since 2005.  She reports increasing frequency and duration of atrial fibrillation since that time.  She has failed medical therapy with both sotalol and flecainide.  She presently reports symptoms of afib and palpitations 2-3 times per week and feels that this is progressing.  She has recently stopped atenolol and is taking metoprolol without any significant improvements.  She reports occasional afib triggered by drinking cold water.  She also reports that being in extreme heat with bring on episodes.  During her afib, she reports symptoms of palpitations, "heart pounding", chest discomfort, SOB, and fatigue.  She denies presyncope or syncope.  She feels "washed out" after.   She otherwise does not have exertional chest pain, orthopnea, PND, pedal edema. She has mild dyspnea on exertion.  Current Medications (verified): 1)  Coumadin 5 Mg Tabs (Warfarin Sodium) .... Take 1 Tablet By Mouth Once A Day 2)  Flecainide Acetate 50 Mg Tabs (Flecainide Acetate) .... Take 2 Tablet By Mouth Two Times A Day 3)  Metoprolol Tartrate 25 Mg Tabs (Metoprolol Tartrate) .... Take 1 Tablet By Mouth Two Times A Day and As Needed 4)  Oscal 500/200 D-3 500-200 Mg-Unit Tabs (Calcium-Vitamin D) .... Take 1 Tablet By Mouth Three Times A Day 5)  Nitrostat 0.4 Mg Subl (Nitroglycerin) .... Use As Directed  Allergies (verified): No Known Drug Allergies  Past History:  Past Medical History: Atrial fibrillation, atrial flutter HL allergic rhinitis denies h/o rheumatic  fever  cath 04/18/09- normal coronary arteries, EF 60%, 1+ MR EP study 8/06- negative  Past Surgical History: THYROID SURGERY TO REMOVE NODULE BREAST BIOPSY- benign Cholecystectomy  Family History: Reviewed history from 08/21/2009 and no changes required. 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  Review of Systems       All systems are reviewed and negative except as listed in the HPI.   Vital Signs:  Patient profile:   66 year old female Height:      65.5 inches Weight:      141 pounds BMI:     23.19 Pulse rate:   65 / minute BP sitting:   110 / 70  (left arm)  Vitals Entered By: Laurance Flatten CMA (August 29, 2009 2:59 PM)  Physical Exam  General:  Well developed, well nourished, in no acute distress. Head:  normocephalic and atraumatic Eyes:  PERRLA/EOM intact; conjunctiva and lids normal. Mouth:  Teeth, gums and palate normal. Oral mucosa normal. Neck:  Neck supple, no JVD. No masses, thyromegaly or abnormal cervical nodes. Lungs:  Clear bilaterally to auscultation and percussion. Heart:  Non-displaced PMI, chest non-tender; regular rate and rhythm, S1, S2 without murmurs, rubs or gallops. Carotid upstroke normal, no bruit. Normal abdominal aortic size, no bruits. Femorals normal pulses, no bruits. Pedals normal pulses. No edema, no varicosities. Abdomen:  Bowel sounds positive; abdomen  soft and non-tender without masses, organomegaly, or hernias noted. No hepatosplenomegaly. Msk:  Back normal, normal gait. Muscle strength and tone normal. Pulses:  pulses normal in all 4 extremities Extremities:  No clubbing or cyanosis. Neurologic:  Alert and oriented x 3. Skin:  Intact without lesions or rashes. Cervical Nodes:  no significant adenopathy Psych:  Normal  affect.   EKG  Procedure date:  08/21/2009  Findings:      sinus rhythm 60 bpm, nl ekg, qt 460  EKG  Procedure date:  07/30/2009  Findings:      afib with RVR  Echocardiogram  Procedure date:  02/07/2009  Findings:      EF nl LA 35mm mild MR, moderate TR  Comments:      [facility] echo  Impression & Recommendations:  Problem # 1:  ATRIAL FIBRILLATION (ICD-427.31) The patient has very symptomatic paroxysmal atrial fibrillation.   She has potential for vagal mediation of her afib.  In addition, I have revealed ekgs from her recent hospitalization which reveal at times a regular tachycardia, likely atrial flutter.  She has failed medical therapy with sotalol and flecainide.  Her CHADS2 score is 1 and she is appropriately anticoagulated with coumadin.  Therapeutic strategies for afib including medicine and ablation were discussed in detail with the patient today. Risk, benefits, and alternatives to EP study and radiofrequency ablation were also discussed in detail today. These risks include but are not limited to stroke, bleeding, vascular damage, tamponade, perforation, damage to the esophagus, lungs, and other structures, pulmonary vein stenosis, worsening renal function, and death. The patient understands these risk and wishes to proceed.   We will therefore schedule afib and atrial flutter ablation at the next available time.

## 2010-06-30 NOTE — Progress Notes (Signed)
Summary: ablation  Phone Note Call from Patient Call back at [phone]   Caller: Patient Reason for Call: Talk to Nurse Summary of Call: ready to sch ablation Initial call taken by: Migdalia Dk,  Oct 22, 2009 2:43 PM  Follow-up for Phone Call        Upon her recent call, she had started propafenone and wanted to avoid ablation.  Since its been almost 2 months since I saw her, she should return to the office to further discuss ablation.  Follow-up by: Hillis Range, MD,  Oct 22, 2009 9:33 PM  Additional Follow-up for Phone Call Additional follow up Details #1::        Called pt a spoke with her- will set up an appoinment with Dr Johney Frame to set ablation back up.   Dennis Bast, RN, BSN  Oct 23, 2009 12:15 PM

## 2010-06-30 NOTE — Medication Information (Signed)
Summary: ccr-lr  Anticoagulant Therapy  Managed by: Vashti Hey, RN PCP: Radene Gunning (Waylan Rocher) Supervising MD: Andee Lineman MD, Michelle Piper Indication 1: Atrial Fibrillation Lab Used: [facility] [place] Site: [place] INR POC 3.0  Dietary changes: no    Health status changes: no    Bleeding/hemorrhagic complications: no    Recent/future hospitalizations: no    Any changes in medication regimen? no    Recent/future dental: no  Any missed doses?: no       Is patient compliant with meds? yes       Allergies: No Known Drug Allergies  Anticoagulation Management History:      The patient is taking warfarin and comes in today for a routine follow up visit.  Negative risk factors for bleeding include an age less than 78 years old.  The bleeding index is 'low risk'.  Negative CHADS2 values include Age > 51 years old.  Anticoagulation responsible provider: Andee Lineman MD, Michelle Piper.  INR POC: 3.0.  Cuvette Lot#: [id number].    Anticoagulation Management Assessment/Plan:      The patient's current anticoagulation dose is Coumadin 5 mg tabs: Take 1 tablet by mouth once a day.  The target INR is 2.0-3.0.  The next INR is due 04/03/2010.  Anticoagulation instructions were given to patient.  Results were reviewed/authorized by Vashti Hey, RN.  She was notified by Vashti Hey RN.         Prior Anticoagulation Instructions: INR 2.4 (2nd post ablation) Continue coumadin 5mg  once daily except 2.5mg  on Mondays and Thursdays  Current Anticoagulation Instructions: INR 3.0 Continue coumadin 5mg  once daily except 2.5mg  on Mondays and Thursdays

## 2010-06-30 NOTE — Letter (Signed)
Summary: ELectrophysiology/Ablation Procedure Instructions  [facility], Main Office  [address]   [place], [place] [address]   Phone: [phone]  Fax: [phone]     Electrophysiology/Ablation Procedure Instructions    You are scheduled for a(n) afib ablation on 02/17/10  at 7:30am with Dr. Johney Frame.  1.  Please come to the Short Stay Center at [facility] at 5:30am on the day of your procedure.  2.  Come prepared to stay overnight.   Please bring your insurance cards and a list of your medications.  3.  Come to the [place] office on 02/12/10 in [place] for lab work.    You do not have to be fasting.  4.  Do not have anything to eat or drink after midnight the night before your procedure.  5.  Do NOT take these medications for the morning of your procedure unless otherwise instructed:  Metoprolol.  All of your remaining medications may be taken with a small amount of water.  6.  Educational material received:   Ablation   * Occasionally, EP studies and ablations can become lengthy.  Please make your family aware of this before your procedure starts.  Average time ranges from 2-8 hours for EP studies/ablations.  Your physician will locate your family after the procedure with the results.  * If you have any questions after you get home, please call the office at [phone]. Anselm Pancoast  TEE-  this will be done on the same day as ablation.    Appended Document: ELectrophysiology/Ablation Procedure Instructions procedure 02/20/10 not 02/17/10

## 2010-06-30 NOTE — Medication Information (Signed)
Summary: ccr-lr  Anticoagulant Therapy  Managed by: Vashti Hey, RN PCP: Radene Gunning (Waylan Rocher) Supervising MD: Diona Browner MD, Remi Deter Indication 1: Atrial Fibrillation Lab Used: [facility] [place] Site: [place] INR POC 2.4  Dietary changes: no    Health status changes: no    Bleeding/hemorrhagic complications: no    Recent/future hospitalizations: no    Any changes in medication regimen? no    Recent/future dental: no  Any missed doses?: no       Is patient compliant with meds? yes       Allergies: No Known Drug Allergies  Anticoagulation Management History:      The patient is taking warfarin and comes in today for a routine follow up visit.  Negative risk factors for bleeding include an age less than 19 years old.  The bleeding index is 'low risk'.  Negative CHADS2 values include Age > 41 years old.  Anticoagulation responsible provider: Diona Browner MD, Remi Deter.  INR POC: 2.4.  Cuvette Lot#: [id number].    Anticoagulation Management Assessment/Plan:      The patient's current anticoagulation dose is Coumadin 5 mg tabs: Take 1 tablet by mouth once a day.  The target INR is 2.0-3.0.  The next INR is due 03/20/2010.  Anticoagulation instructions were given to patient.  Results were reviewed/authorized by Vashti Hey, RN.  She was notified by Vashti Hey RN.         Prior Anticoagulation Instructions: INR 1.6   (1st post ablation) Take coumadin 7.5mg  tonight and tomorrow night then resume 5mg  once daily except 2.5mg  on Mondays and Thursdays   Current Anticoagulation Instructions: INR 2.4 (2nd post ablation) Continue coumadin 5mg  once daily except 2.5mg  on Mondays and Thursdays

## 2010-06-30 NOTE — Medication Information (Signed)
Summary: ccr-lr  Anticoagulant Therapy  Managed by: Vashti Hey, RN PCP: Radene Gunning (Waylan Rocher) Supervising MD: Dietrich Pates MD, Molly Maduro Indication 1: Atrial Fibrillation Lab Used: [facility] [place] Site: [place] INR POC 3.1  Dietary changes: no    Health status changes: no    Bleeding/hemorrhagic complications: no    Recent/future hospitalizations: yes       Details: Pending ablation 02/20/10  Any changes in medication regimen? no    Recent/future dental: no  Any missed doses?: no       Is patient compliant with meds? yes       Allergies: No Known Drug Allergies  Anticoagulation Management History:      The patient is taking warfarin and comes in today for a routine follow up visit.  Negative risk factors for bleeding include an age less than 15 years old.  The bleeding index is 'low risk'.  Negative CHADS2 values include Age > 4 years old.  Anticoagulation responsible provider: Dietrich Pates MD, Molly Maduro.  INR POC: 3.1.  Cuvette Lot#: [id number].    Anticoagulation Management Assessment/Plan:      The patient's current anticoagulation dose is Coumadin 5 mg tabs: Take 1 tablet by mouth once a day.  The target INR is 2.0-3.0.  The next INR is due 02/27/2010.  Anticoagulation instructions were given to patient.  Results were reviewed/authorized by Vashti Hey, RN.  She was notified by Vashti Hey RN.        Coagulation management information includes: Pending abaltion by Dr Johney Frame on 02/20/10   .  Prior Anticoagulation Instructions: INR 3.6 Hold coumadin tonight then resume 5mg  once daily except 2.5mg  on Mondays and Fridays  Current Anticoagulation Instructions: INR 3.1 Take coumadin 1/2 tablet tonight then resume 1 tablet once daily except 1/2 tablets on Mondays and Thursdays

## 2010-06-30 NOTE — Cardiovascular Report (Signed)
Summary: Cardiac Cath  / [facility]  Cardiac Cath  / [facility]   Imported By: Dorise Hiss 08/22/2009 10:05:37  _____________________________________________________________________  External Attachment:    Type:   Image     Comment:   External Document

## 2010-06-30 NOTE — Medication Information (Signed)
Summary: ccr-lr  Anticoagulant Therapy  Managed by: Vashti Hey, RN PCP: Radene Gunning (Waylan Rocher) Supervising MD: Myrtis Ser MD, Tinnie Gens Indication 1: Atrial Fibrillation Lab Used: [facility] [place] Site: [place] INR POC 2.8  Dietary changes: no    Health status changes: no    Bleeding/hemorrhagic complications: no    Recent/future hospitalizations: no    Any changes in medication regimen? no    Recent/future dental: no  Any missed doses?: no       Is patient compliant with meds? yes       Allergies: No Known Drug Allergies  Anticoagulation Management History:      The patient is taking warfarin and comes in today for a routine follow up visit.  Negative risk factors for bleeding include an age less than 71 years old.  The bleeding index is 'low risk'.  Negative CHADS2 values include Age > 51 years old.  Anticoagulation responsible provider: Myrtis Ser MD, Tinnie Gens.  INR POC: 2.8.  Cuvette Lot#: [id number].    Anticoagulation Management Assessment/Plan:      The patient's current anticoagulation dose is Coumadin 5 mg tabs: Take 1 tablet by mouth once a day.  The target INR is 2.0-3.0.  The next INR is due 12/23/2009.  Anticoagulation instructions were given to patient.  Results were reviewed/authorized by Vashti Hey, RN.  She was notified by Vashti Hey RN.         Prior Anticoagulation Instructions: INR 2.2 Increase coumadin to 5mg  once daily except 2.5mg  on Fridays  Current Anticoagulation Instructions: INR 2.8 Continue coumadin 5mg  once daily except 2.5mg  on Fridays

## 2010-06-30 NOTE — Letter (Signed)
Summary: [facility] H&P / D/C   [facility] H[facility]P / D/C   Imported By: Zachary George 08/20/2009 11:09:29  _____________________________________________________________________  External Attachment:    Type:   Image     Comment:   External Document

## 2010-07-02 NOTE — Medication Information (Signed)
Summary: ccr-lr  Anticoagulant Therapy  Managed by: Vashti Hey, RN PCP: Radene Gunning (Waylan Rocher) Supervising MD: Diona Browner MD, Remi Deter Indication 1: Atrial Fibrillation Lab Used: [facility] [place] Site: [place] INR POC 2.9  Dietary changes: no    Health status changes: no    Bleeding/hemorrhagic complications: no    Recent/future hospitalizations: no    Any changes in medication regimen? no    Recent/future dental: no  Any missed doses?: no       Is patient compliant with meds? yes       Allergies: No Known Drug Allergies  Anticoagulation Management History:      The patient is taking warfarin and comes in today for a routine follow up visit.  Negative risk factors for bleeding include an age less than 51 years old.  The bleeding index is 'low risk'.  Negative CHADS2 values include Age > 82 years old.  Anticoagulation responsible provider: Diona Browner MD, Remi Deter.  INR POC: 2.9.  Cuvette Lot#: [id number].    Anticoagulation Management Assessment/Plan:      The patient's current anticoagulation dose is Coumadin 5 mg tabs: Take 1 tablet by mouth once a day.  The target INR is 2.0-3.0.  The next INR is due 06/30/2010.  Anticoagulation instructions were given to patient.  Results were reviewed/authorized by Vashti Hey, RN.  She was notified by Vashti Hey RN.         Prior Anticoagulation Instructions: INR 2.7 Continue coumadin 5mg  once daily except 2.5mg  on Mondays and Thursdays  Current Anticoagulation Instructions: INR 2.4 Continue coumadin 5mg  once daily except 2.5mg  on Mondays and Thursdays

## 2010-07-08 NOTE — Medication Information (Signed)
Summary: ccr-lr  Anticoagulant Therapy  Managed by: Vashti Hey, RN PCP: Radene Gunning (Waylan Rocher) Supervising MD: Andee Lineman MD, Michelle Piper Indication 1: Atrial Fibrillation Lab Used: [facility] [place] Site: [place] INR POC 2.9  Dietary changes: no    Health status changes: no    Bleeding/hemorrhagic complications: no    Recent/future hospitalizations: no    Any changes in medication regimen? no    Recent/future dental: no  Any missed doses?: no       Is patient compliant with meds? yes       Allergies: No Known Drug Allergies  Anticoagulation Management History:      The patient is taking warfarin and comes in today for a routine follow up visit.  Negative risk factors for bleeding include an age less than 49 years old.  The bleeding index is 'low risk'.  Negative CHADS2 values include Age > 54 years old.  Anticoagulation responsible provider: Andee Lineman MD, Michelle Piper.  INR POC: 2.9.  Cuvette Lot#: [id number].    Anticoagulation Management Assessment/Plan:      The patient's current anticoagulation dose is Coumadin 5 mg tabs: Take 1 tablet by mouth once a day.  The target INR is 2.0-3.0.  The next INR is due 07/28/2010.  Anticoagulation instructions were given to patient.  Results were reviewed/authorized by Vashti Hey, RN.  She was notified by Vashti Hey RN.         Prior Anticoagulation Instructions: INR 2.4 Continue coumadin 5mg  once daily except 2.5mg  on Mondays and Thursdays  Current Anticoagulation Instructions: INR 2.9 Continue coumadin 5mg  once daily except 2.5mg  on Mondays and Thursdays

## 2010-07-14 ENCOUNTER — Telehealth: Payer: Self-pay | Admitting: Internal Medicine

## 2010-07-22 NOTE — Progress Notes (Signed)
Summary: METOPROLOL TARTRATE 25 MG TABS  Phone Note Refill Request Call back at Home Phone [phone] Message from:  Patient on July 14, 2010 12:21 PM  Refills Requested: Medication #1:  METOPROLOL TARTRATE 25 MG TABS Take 1 tablet by mouth once daily [facility] [phone]   Method Requested: Fax to Mail Away Pharmacy Initial call taken by: Lorne Skeens,  July 14, 2010 12:21 PM    Prescriptions: METOPROLOL TARTRATE 25 MG TABS (METOPROLOL TARTRATE) Take 1 tablet by mouth once daily  #30 x 5   Entered by:   Caralee Ates CMA   Authorized by:   Hillis Range, MD   Signed by:   Caralee Ates CMA on 07/14/2010   Method used:   Electronically to        [facility]* (retail)       [address]       [place], [place]  [address]       Ph: [phone]       Fax: [phone]   RxID:   [id number]

## 2010-08-04 ENCOUNTER — Encounter (INDEPENDENT_AMBULATORY_CARE_PROVIDER_SITE_OTHER): Payer: Medicare Other

## 2010-08-04 ENCOUNTER — Encounter: Payer: Self-pay | Admitting: Cardiology

## 2010-08-04 DIAGNOSIS — I4891 Unspecified atrial fibrillation: Secondary | ICD-10-CM

## 2010-08-04 DIAGNOSIS — Z7901 Long term (current) use of anticoagulants: Secondary | ICD-10-CM

## 2010-08-04 LAB — CONVERTED CEMR LAB: POC INR: 3.5

## 2010-08-11 NOTE — Medication Information (Signed)
Summary: ccr-lr  Anticoagulant Therapy  Managed by: Vashti Hey, RN PCP: Radene Gunning (Waylan Rocher) Supervising MD: Andee Lineman MD, Michelle Piper Indication 1: Atrial Fibrillation Lab Used: [facility] [place] Site: [place] INR POC 3.5  Dietary changes: no    Health status changes: no    Bleeding/hemorrhagic complications: no    Recent/future hospitalizations: no    Any changes in medication regimen? no    Recent/future dental: no  Any missed doses?: no       Is patient compliant with meds? yes       Allergies: No Known Drug Allergies  Anticoagulation Management History:      The patient is taking warfarin and comes in today for a routine follow up visit.  Positive risk factors for bleeding include an age of 65 years or older.  The bleeding index is 'intermediate risk'.  Negative CHADS2 values include Age > 65 years old.  Anticoagulation responsible provider: Andee Lineman MD, Michelle Piper.  INR POC: 3.5.  Cuvette Lot#: [id number].    Anticoagulation Management Assessment/Plan:      The patient's current anticoagulation dose is Coumadin 5 mg tabs: Take 1 tablet by mouth once a day.  The target INR is 2.0-3.0.  The next INR is due 08/25/2010.  Anticoagulation instructions were given to patient.  Results were reviewed/authorized by Vashti Hey, RN.  She was notified by Vashti Hey RN.         Prior Anticoagulation Instructions: INR 2.9 Continue coumadin 5mg  once daily except 2.5mg  on Mondays and Thursdays  Current Anticoagulation Instructions: INR 3.5 Hold coumadin tonight then decrease dose to 5mg  once daily except 2.5mg  on M,W,F

## 2010-08-13 LAB — BASIC METABOLIC PANEL
CO2: 27 mEq/L (ref 19–32)
Chloride: 104 mEq/L (ref 96–112)
GFR calc Af Amer: >60 mL/min (ref >60)
Potassium: 3.5 mEq/L (ref 3.5–5.1)
Sodium: 136 mEq/L (ref 135–145)

## 2010-08-13 LAB — CBC
HCT: 29.3 % — ABNORMAL LOW (ref 36.0–46.0)
HCT: 31.8 % — ABNORMAL LOW (ref 36.0–46.0)
Hemoglobin: 10.2 g/dL — ABNORMAL LOW (ref 12.0–15.0)
MCH: 31.3 pg (ref 26.0–34.0)
MCH: 31.4 pg (ref 26.0–34.0)
MCV: 96.7 fL (ref 78.0–100.0)
MCV: 97.5 fL (ref 78.0–100.0)
RBC: 3.03 MIL/uL — ABNORMAL LOW (ref 3.87–5.11)
RBC: 3.26 MIL/uL — ABNORMAL LOW (ref 3.87–5.11)
WBC: 6.7 10*3/uL (ref 4.0–10.5)

## 2010-08-13 LAB — MAGNESIUM: Magnesium: 2 mg/dL (ref 1.5–2.5)

## 2010-08-22 ENCOUNTER — Encounter: Payer: Self-pay | Admitting: Internal Medicine

## 2010-08-22 DIAGNOSIS — I4892 Unspecified atrial flutter: Secondary | ICD-10-CM

## 2010-08-22 DIAGNOSIS — Z7901 Long term (current) use of anticoagulants: Secondary | ICD-10-CM | POA: Insufficient documentation

## 2010-08-22 DIAGNOSIS — I4891 Unspecified atrial fibrillation: Secondary | ICD-10-CM

## 2010-08-25 ENCOUNTER — Ambulatory Visit (INDEPENDENT_AMBULATORY_CARE_PROVIDER_SITE_OTHER): Payer: Medicare Other | Admitting: *Deleted

## 2010-08-25 DIAGNOSIS — I4892 Unspecified atrial flutter: Secondary | ICD-10-CM

## 2010-08-25 DIAGNOSIS — Z7901 Long term (current) use of anticoagulants: Secondary | ICD-10-CM

## 2010-08-25 DIAGNOSIS — I4891 Unspecified atrial fibrillation: Secondary | ICD-10-CM

## 2010-09-01 ENCOUNTER — Other Ambulatory Visit: Payer: Self-pay | Admitting: Cardiology

## 2010-09-02 NOTE — Telephone Encounter (Signed)
Pharmacist states pt needs refill coumadin 5mg  #30 refill 3....  Pharmacy [phone]

## 2010-09-21 ENCOUNTER — Ambulatory Visit (INDEPENDENT_AMBULATORY_CARE_PROVIDER_SITE_OTHER): Payer: Medicare Other | Admitting: *Deleted

## 2010-09-21 DIAGNOSIS — I4891 Unspecified atrial fibrillation: Secondary | ICD-10-CM

## 2010-09-21 DIAGNOSIS — I4892 Unspecified atrial flutter: Secondary | ICD-10-CM

## 2010-09-21 DIAGNOSIS — Z7901 Long term (current) use of anticoagulants: Secondary | ICD-10-CM

## 2010-09-21 LAB — POCT INR: INR: 2.2

## 2010-09-25 ENCOUNTER — Encounter: Payer: Medicare Other | Admitting: *Deleted

## 2010-10-16 ENCOUNTER — Telehealth: Payer: Self-pay | Admitting: Internal Medicine

## 2010-10-16 NOTE — Telephone Encounter (Signed)
Pt requesting that her propafenone be refilled.  This medication was d/c'd by Dr. Johney Frame in November, 2011.  Pt states she has continued to take it though.  Please review and authorize.  Thank you  Judithe Modest, CMA

## 2010-10-16 NOTE — Telephone Encounter (Signed)
Pt received call regarding refill and was told to call back.  She is at home now. Medication is Protalenone that she is requesting refill on.

## 2010-10-19 MED ORDER — PROPAFENONE HCL ER 225 MG PO CP12: 225.0000 mg | ORAL_CAPSULE | Freq: Two times a day (BID) | ORAL | Status: DC

## 2010-10-19 NOTE — Telephone Encounter (Signed)
See phone note  Will refill Propafanone for 30 days has a f/u appointment on 11/02/10 with Dr Johney Frame

## 2010-10-19 NOTE — Telephone Encounter (Signed)
Addended by: Dennis Bast on: 10/19/2010 11:09 AM   Modules accepted: Orders

## 2010-10-23 ENCOUNTER — Ambulatory Visit (INDEPENDENT_AMBULATORY_CARE_PROVIDER_SITE_OTHER): Payer: Medicare Other | Admitting: *Deleted

## 2010-10-23 DIAGNOSIS — R0989 Other specified symptoms and signs involving the circulatory and respiratory systems: Secondary | ICD-10-CM

## 2010-10-23 DIAGNOSIS — I4891 Unspecified atrial fibrillation: Secondary | ICD-10-CM

## 2010-10-23 DIAGNOSIS — Z7901 Long term (current) use of anticoagulants: Secondary | ICD-10-CM

## 2010-10-23 DIAGNOSIS — I4892 Unspecified atrial flutter: Secondary | ICD-10-CM

## 2010-10-23 LAB — POCT INR: INR: 1.9

## 2010-10-26 ENCOUNTER — Other Ambulatory Visit: Payer: Self-pay | Admitting: *Deleted

## 2010-10-26 MED ORDER — PROPAFENONE HCL ER 225 MG PO CP12: 225.0000 mg | ORAL_CAPSULE | Freq: Two times a day (BID) | ORAL | Status: DC

## 2010-10-30 ENCOUNTER — Encounter: Payer: Self-pay | Admitting: Internal Medicine

## 2010-11-02 ENCOUNTER — Ambulatory Visit (INDEPENDENT_AMBULATORY_CARE_PROVIDER_SITE_OTHER): Payer: Medicare Other | Admitting: Internal Medicine

## 2010-11-02 ENCOUNTER — Encounter: Payer: Self-pay | Admitting: Internal Medicine

## 2010-11-02 VITALS — BP 116/71 | HR 62 | Resp 14 | Ht 66.0 in | Wt 151.0 lb

## 2010-11-02 DIAGNOSIS — I4891 Unspecified atrial fibrillation: Secondary | ICD-10-CM

## 2010-11-02 MED ORDER — PROPAFENONE HCL ER 225 MG PO CP12: ORAL_CAPSULE | ORAL | Status: DC

## 2010-11-02 NOTE — Patient Instructions (Signed)
Your physician wants you to follow-up in: 6 months with Dr Jacquiline Doe will receive a reminder letter in the mail two months in advance. If you don't receive a letter, please call our office to schedule the follow-up appointment.  Your physician has recommended you make the following change in your medication: decrease Propafenone to 1/2 tablet twice daily

## 2010-11-02 NOTE — Progress Notes (Signed)
The patient presents today for routine electrophysiology followup.  Since last being seen in our clinic, the patient reports doing very well.  She has had no further afib since her ablation.  She is pleased with the results of her procedure and has returned to all activity including playing golf.  Unfortunately, she did not stop rhythmol as well discussed previously due to concerns that she may have afib.  Today, she denies symptoms of chest pain, shortness of breath, orthopnea, PND, lower extremity edema, dizziness, presyncope, syncope, or neurologic sequela.  The patient feels that she is tolerating medications without difficulties and is otherwise without complaint today.   Past Medical History  Diagnosis Date  . Atrial fibrillation     s/p ablation 02/21/10  . S/P ablation of atrial flutter 02/21/2010  . Hyperlipidemia   . Allergic rhinitis    Past Surgical History  Procedure Date  . Cardiac catheterization 04/18/2009    Normal coronary arteries, EF 60%, 1+ MR  . Ep study 02/20/2010    CTI and PVI ablation by [facility]    Current Outpatient Prescriptions  Medication Sig Dispense Refill  . calcium-vitamin D (OSCAL 500/200 D-3) 500-200 MG-UNIT per tablet Take 1 tablet by mouth 3 (three) times daily.        . Cyanocobalamin (VITAMIN B-12 IJ) Inject as directed. Every 30 days       . latanoprost (XALATAN) 0.005 % ophthalmic solution 1 drop at bedtime.        . metoprolol succinate (TOPROL-XL) 25 MG 24 hr tablet Take 25 mg by mouth daily.        . nitroGLYCERIN (NITROSTAT) 0.4 MG SL tablet Place 0.4 mg under the tongue as directed.        . pantoprazole (PROTONIX) 40 MG tablet Take 40 mg by mouth daily.        . propafenone (RYTHMOL SR) 225 MG 12 hr capsule Take 1 capsule (225 mg total) by mouth 2 (two) times daily.  60 capsule  1  . warfarin (COUMADIN) 5 MG tablet TAKE 1 TABLET BY MOUTH ONCE A DAY  30 tablet  3  . DISCONTD: cyanocobalamin 100 MCG tablet Take 100 mcg by mouth every 30 (thirty)  days.         No Known Allergies  History   Social History  . Marital Status: Married    Spouse Name: N/A    Number of Children: N/A  . Years of Education: N/A   Occupational History  . Part time house cleaner    Social History Main Topics  . Smoking status: Never Smoker   . Smokeless tobacco: Not on file  . Alcohol Use: No  . Drug Use: Not on file  . Sexually Active: Not on file   Other Topics Concern  . Not on file   Social History Narrative   MarriedLives with spouse in [place], [place] 2 grown children    Family History  Problem Relation Age of Onset  . Heart failure Mother   . Colon cancer Father   . Coronary artery disease Neg Hx    Physical Exam: Filed Vitals:   11/02/10 1057  BP: 116/71  Pulse: 62  Resp: 14  Height: 5\' 6"  (1.676 m)  Weight: 151 lb (68.493 kg)    GEN- The patient is well appearing, alert and oriented x 3 today.   Head- normocephalic, atraumatic Eyes-  Sclera clear, conjunctiva pink Ears- hearing intact Oropharynx- clear Neck- supple, no JVP Lymph- no cervical lymphadenopathy Lungs-  Clear to ausculation bilaterally, normal work of breathing Heart- Regular rate and rhythm, no murmurs, rubs or gallops, PMI not laterally displaced GI- soft, NT, ND, + BS Extremities- no clubbing, cyanosis, or edema MS- no significant deformity or atrophy Skin- no rash or lesion Psych- euthymic mood, full affect Neuro- strength and sensation are intact  ekg today reveals sinus rhythm 63 bpm, otherwise normal ekg  Assessment and Plan:

## 2010-11-02 NOTE — Assessment & Plan Note (Signed)
Doing well s/p ablation without recurrence. I have again today recommended that she stop rhythmol.  She will consider decreasing to 1/2 pill BID but is not ready to quit. She will continue coumadin.  Her CHADSVASC score is 2, but chads2 score is 0.  She wishes to continue coumadin at this time.

## 2010-11-20 ENCOUNTER — Ambulatory Visit (INDEPENDENT_AMBULATORY_CARE_PROVIDER_SITE_OTHER): Payer: Medicare Other | Admitting: *Deleted

## 2010-11-20 DIAGNOSIS — Z7901 Long term (current) use of anticoagulants: Secondary | ICD-10-CM

## 2010-11-20 DIAGNOSIS — I4891 Unspecified atrial fibrillation: Secondary | ICD-10-CM

## 2010-11-20 DIAGNOSIS — I4892 Unspecified atrial flutter: Secondary | ICD-10-CM

## 2010-11-20 LAB — POCT INR: INR: 2.9

## 2010-12-18 ENCOUNTER — Ambulatory Visit (INDEPENDENT_AMBULATORY_CARE_PROVIDER_SITE_OTHER): Payer: Medicare Other | Admitting: *Deleted

## 2010-12-18 DIAGNOSIS — I4891 Unspecified atrial fibrillation: Secondary | ICD-10-CM

## 2010-12-18 DIAGNOSIS — I4892 Unspecified atrial flutter: Secondary | ICD-10-CM

## 2010-12-18 DIAGNOSIS — Z7901 Long term (current) use of anticoagulants: Secondary | ICD-10-CM

## 2011-01-14 ENCOUNTER — Other Ambulatory Visit: Payer: Self-pay | Admitting: Internal Medicine

## 2011-01-15 ENCOUNTER — Other Ambulatory Visit: Payer: Self-pay | Admitting: *Deleted

## 2011-01-15 ENCOUNTER — Encounter: Payer: Medicare Other | Admitting: *Deleted

## 2011-01-15 MED ORDER — WARFARIN SODIUM 5 MG PO TABS: 5.0000 mg | ORAL_TABLET | Freq: Every day | ORAL | Status: DC

## 2011-01-22 ENCOUNTER — Ambulatory Visit (INDEPENDENT_AMBULATORY_CARE_PROVIDER_SITE_OTHER): Payer: Medicare Other | Admitting: *Deleted

## 2011-01-22 ENCOUNTER — Telehealth: Payer: Self-pay | Admitting: Cardiology

## 2011-01-22 DIAGNOSIS — I4891 Unspecified atrial fibrillation: Secondary | ICD-10-CM

## 2011-01-22 DIAGNOSIS — Z7901 Long term (current) use of anticoagulants: Secondary | ICD-10-CM

## 2011-01-22 DIAGNOSIS — I4892 Unspecified atrial flutter: Secondary | ICD-10-CM

## 2011-01-22 LAB — POCT INR: INR: 3.2

## 2011-01-22 NOTE — Telephone Encounter (Signed)
[facility] calling for status on fax re dna testing on this pt

## 2011-01-26 ENCOUNTER — Telehealth: Payer: Self-pay | Admitting: Cardiology

## 2011-01-26 NOTE — Telephone Encounter (Signed)
Spoke with [employer], no testing at this time Jessica Fowler

## 2011-01-26 NOTE — Telephone Encounter (Signed)
Faxed over DNA authorization from [facility] on August 18th. Calling to check if this authorization has been received and returned. Please return call back. Ref# [id number]

## 2011-01-30 ENCOUNTER — Other Ambulatory Visit: Payer: Self-pay | Admitting: Internal Medicine

## 2011-02-19 ENCOUNTER — Ambulatory Visit (INDEPENDENT_AMBULATORY_CARE_PROVIDER_SITE_OTHER): Payer: Medicare Other | Admitting: *Deleted

## 2011-02-19 DIAGNOSIS — I4891 Unspecified atrial fibrillation: Secondary | ICD-10-CM

## 2011-02-19 DIAGNOSIS — Z7901 Long term (current) use of anticoagulants: Secondary | ICD-10-CM

## 2011-02-19 DIAGNOSIS — I4892 Unspecified atrial flutter: Secondary | ICD-10-CM

## 2011-03-19 ENCOUNTER — Ambulatory Visit (INDEPENDENT_AMBULATORY_CARE_PROVIDER_SITE_OTHER): Payer: Medicare Other | Admitting: *Deleted

## 2011-03-19 DIAGNOSIS — I4892 Unspecified atrial flutter: Secondary | ICD-10-CM

## 2011-03-19 DIAGNOSIS — I4891 Unspecified atrial fibrillation: Secondary | ICD-10-CM

## 2011-03-19 DIAGNOSIS — Z7901 Long term (current) use of anticoagulants: Secondary | ICD-10-CM

## 2011-04-16 ENCOUNTER — Ambulatory Visit (INDEPENDENT_AMBULATORY_CARE_PROVIDER_SITE_OTHER): Payer: Medicare Other | Admitting: *Deleted

## 2011-04-16 DIAGNOSIS — Z7901 Long term (current) use of anticoagulants: Secondary | ICD-10-CM

## 2011-04-16 DIAGNOSIS — I4892 Unspecified atrial flutter: Secondary | ICD-10-CM

## 2011-04-16 DIAGNOSIS — I4891 Unspecified atrial fibrillation: Secondary | ICD-10-CM

## 2011-05-14 ENCOUNTER — Ambulatory Visit (INDEPENDENT_AMBULATORY_CARE_PROVIDER_SITE_OTHER): Payer: Medicare Other | Admitting: Internal Medicine

## 2011-05-14 ENCOUNTER — Encounter: Payer: Self-pay | Admitting: Internal Medicine

## 2011-05-14 VITALS — BP 118/68 | HR 64 | Ht 66.0 in | Wt 146.0 lb

## 2011-05-14 DIAGNOSIS — I4891 Unspecified atrial fibrillation: Secondary | ICD-10-CM

## 2011-05-14 NOTE — Assessment & Plan Note (Signed)
Doing well s/p ablation without recurrence. I have again today recommended that she stop rhythmol.   Her CHADSVASC score is 2, but chads2 score is 0.  She wishes to continue coumadin at this time.  We will place an event monitor once off of rhythmol.  IF she has no further afib, we will stop coumadin.  Return in 6 months

## 2011-05-14 NOTE — Patient Instructions (Addendum)
Your physician wants you to follow-up in: 6 months with Dr Jacquiline Doe will receive a reminder letter in the mail two months in advance. If you don't receive a letter, please call our office to schedule the follow-up appointment.   Call Tresa Endo [phone] after 06/2011 and will stop Rythmol when you get the event monitor  If monitor looks good will stop Coumadin

## 2011-05-14 NOTE — Progress Notes (Signed)
The patient presents today for routine electrophysiology followup.  Since last being seen in our clinic, the patient reports doing very well.  She has had no further afib since her ablation.  She is pleased with the results of her procedure and has returned to all activity including playing golf. Today, she denies symptoms of chest pain, shortness of breath, orthopnea, PND, lower extremity edema, dizziness, presyncope, syncope, or neurologic sequela.  The patient feels that she is tolerating medications without difficulties and is otherwise without complaint today.   Past Medical History  Diagnosis Date  . Atrial fibrillation     s/p ablation 02/21/10  . S/P ablation of atrial flutter 02/21/2010  . Hyperlipidemia   . Allergic rhinitis    Past Surgical History  Procedure Date  . Cardiac catheterization 04/18/2009    Normal coronary arteries, EF 60%, 1+ MR  . Ep study 02/20/2010    CTI and PVI ablation by [facility]    Current Outpatient Prescriptions  Medication Sig Dispense Refill  . calcium-vitamin D (OSCAL 500/200 D-3) 500-200 MG-UNIT per tablet Take 1 tablet by mouth 3 (three) times daily.        . Cyanocobalamin (VITAMIN B-12 IJ) Inject as directed. Every 30 days       . latanoprost (XALATAN) 0.005 % ophthalmic solution 1 drop at bedtime.        . metoprolol succinate (TOPROL-XL) 25 MG 24 hr tablet Take 25 mg by mouth daily.        . nitroGLYCERIN (NITROSTAT) 0.4 MG SL tablet Place 0.4 mg under the tongue as directed.        . propafenone (RYTHMOL SR) 225 MG 12 hr capsule 1/2 tablet twice daily  60 capsule  1  . warfarin (COUMADIN) 5 MG tablet Take 1 tablet (5 mg total) by mouth daily. PATIENT NEED OFFICE VISIT WITH CRENSHAW  30 tablet  3    No Known Allergies  History   Social History  . Marital Status: Married    Spouse Name: N/A    Number of Children: N/A  . Years of Education: N/A   Occupational History  . Part time house cleaner    Social History Main Topics  . Smoking  status: Never Smoker   . Smokeless tobacco: Not on file  . Alcohol Use: No  . Drug Use: Not on file  . Sexually Active: Not on file   Other Topics Concern  . Not on file   Social History Narrative   MarriedLives with spouse in [place], [place] 2 grown children    Family History  Problem Relation Age of Onset  . Heart failure Mother   . Colon cancer Father   . Coronary artery disease Neg Hx    Physical Exam: Filed Vitals:   05/14/11 1048  BP: 118/68  Pulse: 64  Height: 5\' 6"  (1.676 m)  Weight: 146 lb (66.225 kg)    GEN- The patient is well appearing, alert and oriented x 3 today.   Head- normocephalic, atraumatic Eyes-  Sclera clear, conjunctiva pink Ears- hearing intact Oropharynx- clear Neck- supple, no JVP Lymph- no cervical lymphadenopathy Lungs- Clear to ausculation bilaterally, normal work of breathing Heart- Regular rate and rhythm, no murmurs, rubs or gallops, PMI not laterally displaced GI- soft, NT, ND, + BS Extremities- no clubbing, cyanosis, or edema MS- no significant deformity or atrophy Skin- no rash or lesion Psych- euthymic mood, full affect Neuro- strength and sensation are intact  ekg today reveals sinus rhythm 64 bpm,  otherwise normal ekg  Assessment and Plan:

## 2011-05-18 ENCOUNTER — Ambulatory Visit (INDEPENDENT_AMBULATORY_CARE_PROVIDER_SITE_OTHER): Payer: Medicare Other | Admitting: *Deleted

## 2011-05-18 DIAGNOSIS — I4892 Unspecified atrial flutter: Secondary | ICD-10-CM

## 2011-05-18 DIAGNOSIS — I4891 Unspecified atrial fibrillation: Secondary | ICD-10-CM

## 2011-05-18 DIAGNOSIS — Z7901 Long term (current) use of anticoagulants: Secondary | ICD-10-CM

## 2011-05-18 LAB — POCT INR: INR: 2.6

## 2011-06-17 ENCOUNTER — Telehealth: Payer: Self-pay | Admitting: Internal Medicine

## 2011-06-17 ENCOUNTER — Other Ambulatory Visit: Payer: Self-pay | Admitting: Cardiology

## 2011-06-17 DIAGNOSIS — I4891 Unspecified atrial fibrillation: Secondary | ICD-10-CM

## 2011-06-17 NOTE — Telephone Encounter (Signed)
New problem Patient returning your call about a heart monitor

## 2011-06-17 NOTE — Telephone Encounter (Signed)
Called patient and let her know I will order monitor  She stopped Rhythmol 06/14/11  I will place an order for monitor and she will get a call in regards to that

## 2011-06-21 ENCOUNTER — Encounter (INDEPENDENT_AMBULATORY_CARE_PROVIDER_SITE_OTHER): Payer: Medicare Other

## 2011-06-21 DIAGNOSIS — I4891 Unspecified atrial fibrillation: Secondary | ICD-10-CM

## 2011-06-24 ENCOUNTER — Other Ambulatory Visit (HOSPITAL_COMMUNITY): Payer: Medicare Other | Admitting: Radiology

## 2011-06-29 ENCOUNTER — Ambulatory Visit (INDEPENDENT_AMBULATORY_CARE_PROVIDER_SITE_OTHER): Payer: Medicare Other | Admitting: *Deleted

## 2011-06-29 DIAGNOSIS — Z7901 Long term (current) use of anticoagulants: Secondary | ICD-10-CM

## 2011-06-29 DIAGNOSIS — I4892 Unspecified atrial flutter: Secondary | ICD-10-CM

## 2011-06-29 DIAGNOSIS — I4891 Unspecified atrial fibrillation: Secondary | ICD-10-CM

## 2011-07-19 ENCOUNTER — Telehealth: Payer: Self-pay | Admitting: *Deleted

## 2011-07-19 NOTE — Telephone Encounter (Signed)
Fu call °Pt returning your call  °

## 2011-07-19 NOTE — Telephone Encounter (Signed)
Lmom for patient with results of her monitor  SR w/ rare PAC's  Okay to stop Coumadin per Dr Jenel Lucks 05/2011 office note.  Asked she call me back to confirm

## 2011-07-19 NOTE — Telephone Encounter (Signed)
Pt aware of results of monitor 

## 2011-08-10 ENCOUNTER — Ambulatory Visit (INDEPENDENT_AMBULATORY_CARE_PROVIDER_SITE_OTHER): Payer: Medicare Other | Admitting: *Deleted

## 2011-08-10 DIAGNOSIS — Z7901 Long term (current) use of anticoagulants: Secondary | ICD-10-CM

## 2011-08-10 DIAGNOSIS — I4892 Unspecified atrial flutter: Secondary | ICD-10-CM

## 2011-08-10 DIAGNOSIS — I4891 Unspecified atrial fibrillation: Secondary | ICD-10-CM

## 2012-02-07 ENCOUNTER — Encounter: Payer: Self-pay | Admitting: Physician Assistant

## 2012-02-07 ENCOUNTER — Other Ambulatory Visit: Payer: Self-pay | Admitting: *Deleted

## 2012-02-07 MED ORDER — METOPROLOL SUCCINATE ER 25 MG PO TB24: 25.0000 mg | ORAL_TABLET | Freq: Every day | ORAL | Status: DC

## 2012-03-17 ENCOUNTER — Ambulatory Visit (INDEPENDENT_AMBULATORY_CARE_PROVIDER_SITE_OTHER): Payer: Medicare Other | Admitting: Internal Medicine

## 2012-03-17 ENCOUNTER — Encounter: Payer: Self-pay | Admitting: Internal Medicine

## 2012-03-17 VITALS — BP 95/69 | HR 58 | Ht 66.0 in | Wt 145.0 lb

## 2012-03-17 DIAGNOSIS — I4891 Unspecified atrial fibrillation: Secondary | ICD-10-CM

## 2012-03-17 NOTE — Patient Instructions (Addendum)
Your physician wants you to follow-up in: 6 months with Dr. Allred. You will receive a reminder letter in the mail two months in advance. If you don't receive a letter, please call our office to schedule the follow-up appointment.  

## 2012-03-19 NOTE — Assessment & Plan Note (Signed)
Doing well s/p ablation Event monitor 1/13 is reviewed which reveals no afib She has rare palpitations which are consistent with PACS seen on her monitor. No changes

## 2012-03-19 NOTE — Progress Notes (Signed)
PCP: Jessica Sages, MD  The patient presents today for routine electrophysiology followup.  Since last being seen in our clinic, the patient reports doing very well.  She has had no further afib since her ablation.  Today, she denies symptoms of chest pain, shortness of breath, orthopnea, PND, lower extremity edema, dizziness, presyncope, syncope, or neurologic sequela.  The patient feels that she is tolerating medications without difficulties and is otherwise without complaint today.   Past Medical History  Diagnosis Date  . Atrial fibrillation     s/p ablation 02/21/10  . S/P ablation of atrial flutter 02/21/2010  . Hyperlipidemia   . Allergic rhinitis    Past Surgical History  Procedure Date  . Cardiac catheterization 04/18/2009    Normal coronary arteries, EF 60%, 1+ MR  . Ep study 02/20/2010    CTI and PVI ablation by [facility]    Current Outpatient Prescriptions  Medication Sig Dispense Refill  . aspirin 81 MG tablet Take 81 mg by mouth daily.      . calcium-vitamin D (OSCAL 500/200 D-3) 500-200 MG-UNIT per tablet Take 1 tablet by mouth 3 (three) times daily.        . Cyanocobalamin (VITAMIN B-12 IJ) Inject as directed. Every 30 days       . latanoprost (XALATAN) 0.005 % ophthalmic solution 1 drop at bedtime.        . metoprolol succinate (TOPROL-XL) 25 MG 24 hr tablet Take 1 tablet (25 mg total) by mouth daily.  30 tablet  6  . nitroGLYCERIN (NITROSTAT) 0.4 MG SL tablet Place 0.4 mg under the tongue as directed.          No Known Allergies  History   Social History  . Marital Status: Married    Spouse Name: N/A    Number of Children: N/A  . Years of Education: N/A   Occupational History  . Part time house cleaner    Social History Main Topics  . Smoking status: Never Smoker   . Smokeless tobacco: Not on file  . Alcohol Use: No  . Drug Use: Not on file  . Sexually Active: Not on file   Other Topics Concern  . Not on file   Social History Narrative   MarriedLives with  spouse in [place], [place] 2 grown children    Family History  Problem Relation Age of Onset  . Heart failure Mother   . Colon cancer Father   . Coronary artery disease Neg Hx    Physical Exam: Filed Vitals:   03/17/12 1131  BP: 95/69  Pulse: 58  Height: 5\' 6"  (1.676 m)  Weight: 145 lb (65.772 kg)    GEN- The patient is well appearing, alert and oriented x 3 today.   Head- normocephalic, atraumatic Eyes-  Sclera clear, conjunctiva pink Ears- hearing intact Oropharynx- clear Neck- supple, no JVP Lymph- no cervical lymphadenopathy Lungs- Clear to ausculation bilaterally, normal work of breathing Heart- Regular rate and rhythm, no murmurs, rubs or gallops, PMI not laterally displaced GI- soft, NT, ND, + BS Extremities- no clubbing, cyanosis, or edema  ekg today reveals sinus bradycardia 59 bpm, otherwise normal ekg  Assessment and Plan:

## 2012-03-22 ENCOUNTER — Ambulatory Visit: Payer: Self-pay | Admitting: *Deleted

## 2012-03-22 DIAGNOSIS — Z7901 Long term (current) use of anticoagulants: Secondary | ICD-10-CM

## 2012-03-22 DIAGNOSIS — I4891 Unspecified atrial fibrillation: Secondary | ICD-10-CM

## 2012-03-22 DIAGNOSIS — I4892 Unspecified atrial flutter: Secondary | ICD-10-CM

## 2012-07-05 ENCOUNTER — Telehealth: Payer: Self-pay | Admitting: Internal Medicine

## 2012-07-05 NOTE — Telephone Encounter (Signed)
I spoke with pt and she said everything had been taken care of.

## 2012-07-05 NOTE — Telephone Encounter (Signed)
New Problem     Pt called in to schedule 6 month f/u. Pt stated she has been having some issues with her heart and wanted to come in sooner. Scheduled her for 2/7.

## 2012-07-07 ENCOUNTER — Encounter: Payer: Self-pay | Admitting: Internal Medicine

## 2012-07-07 ENCOUNTER — Ambulatory Visit (INDEPENDENT_AMBULATORY_CARE_PROVIDER_SITE_OTHER): Payer: Medicare Other | Admitting: Internal Medicine

## 2012-07-07 VITALS — BP 116/71 | HR 62 | Ht 65.5 in | Wt 146.0 lb

## 2012-07-07 DIAGNOSIS — I4891 Unspecified atrial fibrillation: Secondary | ICD-10-CM

## 2012-07-07 MED ORDER — METOPROLOL SUCCINATE ER 25 MG PO TB24: 25.0000 mg | ORAL_TABLET | Freq: Two times a day (BID) | ORAL | Status: DC

## 2012-07-07 NOTE — Patient Instructions (Addendum)
Your physician recommends that you schedule a follow-up appointment with Dr Johney Frame in April   If you go out of Rhythm come to the office for an EKG  Your physician has recommended you make the following change in your medication:  1) increase Metoprolol to twice daily

## 2012-07-09 NOTE — Assessment & Plan Note (Signed)
Doing well s/p ablation Event monitor 1/13 is reviewed which reveals no afib She has palpitations which are consistent with PACS seen on her monitor.  I will increase metoprolol to twice daily at this time No other changes

## 2012-07-09 NOTE — Progress Notes (Signed)
PCP: Vickey Sages, MD  The patient presents today for routine electrophysiology followup.  Since last being seen in our clinic, the patient reports doing very well. She continues to have very short palpitations.  Event monitor previously has determined that these are due to PACs.  Today, she denies symptoms of chest pain, shortness of breath, orthopnea, PND, lower extremity edema, dizziness, presyncope, syncope, or neurologic sequela.  The patient feels that she is tolerating medications without difficulties and is otherwise without complaint today.   Past Medical History  Diagnosis Date  . Atrial fibrillation     s/p ablation 02/21/10  . S/P ablation of atrial flutter 02/21/2010  . Hyperlipidemia   . Allergic rhinitis    Past Surgical History  Procedure Laterality Date  . Cardiac catheterization  04/18/2009    Normal coronary arteries, EF 60%, 1+ MR  . Ep study  02/20/2010    CTI and PVI ablation by [facility]    Current Outpatient Prescriptions  Medication Sig Dispense Refill  . aspirin 81 MG tablet Take 81 mg by mouth daily.      . calcium-vitamin D (OSCAL 500/200 D-3) 500-200 MG-UNIT per tablet Take 1 tablet by mouth 3 (three) times daily.        . Cyanocobalamin (VITAMIN B-12 IJ) Inject as directed. Every 30 days       . latanoprost (XALATAN) 0.005 % ophthalmic solution 1 drop at bedtime.        . metoprolol succinate (TOPROL-XL) 25 MG 24 hr tablet Take 1 tablet (25 mg total) by mouth 2 (two) times daily.  60 tablet  6  . nitroGLYCERIN (NITROSTAT) 0.4 MG SL tablet Place 0.4 mg under the tongue as directed.         No current facility-administered medications for this visit.    No Known Allergies  History   Social History  . Marital Status: Married    Spouse Name: N/A    Number of Children: N/A  . Years of Education: N/A   Occupational History  . Part time house cleaner    Social History Main Topics  . Smoking status: Never Smoker   . Smokeless tobacco: Not on file  . Alcohol  Use: No  . Drug Use: Not on file  . Sexually Active: Not on file   Other Topics Concern  . Not on file   Social History Narrative   Married   Lives with spouse in [place], [place]   Has 2 grown children    Family History  Problem Relation Age of Onset  . Heart failure Mother   . Colon cancer Father   . Coronary artery disease Neg Hx    Physical Exam: Filed Vitals:   07/07/12 1057  BP: 116/71  Pulse: 62  Height: 5' 5.5" (1.664 m)  Weight: 146 lb (66.225 kg)    GEN- The patient is well appearing, alert and oriented x 3 today.   Head- normocephalic, atraumatic Eyes-  Sclera clear, conjunctiva pink Ears- hearing intact Oropharynx- clear Neck- supple, no JVP Lymph- no cervical lymphadenopathy Lungs- Clear to ausculation bilaterally, normal work of breathing Heart- Regular rate and rhythm, no murmurs, rubs or gallops, PMI not laterally displaced GI- soft, NT, ND, + BS Extremities- no clubbing, cyanosis, or edema  ekg today reveals sinus rhythm 60 bpm, otherwise normal ekg  Assessment and Plan:

## 2012-09-04 ENCOUNTER — Ambulatory Visit: Payer: Medicare Other | Admitting: Internal Medicine

## 2012-09-05 ENCOUNTER — Telehealth: Payer: Self-pay | Admitting: *Deleted

## 2012-09-05 MED ORDER — METOPROLOL TARTRATE 25 MG PO TABS: 25.0000 mg | ORAL_TABLET | Freq: Two times a day (BID) | ORAL | Status: DC

## 2012-09-05 NOTE — Telephone Encounter (Signed)
Pt called wanting refills. Noticed pt was taking Toprol XL BID. Asked pt to read what was on her pill bottle and she stated she is on tartrate 25mg  BID. Asked pt how she is feeling and she states she is feeling well and has been taking this medication for a while. Spoke to Triage nurse Pam RN to ask if I should fill pt medication like how pt is seeing it on her Pill bottle and she give the go ahead and forward to Dr. Jenel Lucks nurse informing her of this. Called pharmacy to see how the hx of this medication has been. Per Pharmacist, this medication came to them from [facility] [phone]) as Tartrate 25mg  QD and pt came in today informing them that our Provider told her to increase it to 25 mg BID of the Tartrate. She also states that the Tartrate was Prior Authorized 02-07-2012 from our office. Looked in notes and did not see no notes of the change to Tartrate 25mg  BID. Informed office manager of the change (Succinate to Tartrate). Informed Pt that if there is any questions, Dr. Johney Frame Nurse will call her back but we will go ahead and refill her medication. Pt agreed.

## 2012-09-06 NOTE — Addendum Note (Signed)
Addended by: Dennis Bast F on: 09/06/2012 02:26 PM   Modules accepted: Orders, Medications

## 2012-09-07 ENCOUNTER — Other Ambulatory Visit: Payer: Self-pay | Admitting: *Deleted

## 2012-09-07 MED ORDER — METOPROLOL TARTRATE 25 MG PO TABS: 25.0000 mg | ORAL_TABLET | Freq: Two times a day (BID) | ORAL | Status: DC

## 2012-10-25 ENCOUNTER — Ambulatory Visit: Payer: Medicare Other | Admitting: Internal Medicine

## 2013-01-01 ENCOUNTER — Encounter: Payer: Self-pay | Admitting: Internal Medicine

## 2013-01-01 ENCOUNTER — Ambulatory Visit (INDEPENDENT_AMBULATORY_CARE_PROVIDER_SITE_OTHER): Payer: Medicare Other | Admitting: Internal Medicine

## 2013-01-01 VITALS — BP 110/75 | HR 63 | Ht 65.0 in | Wt 148.8 lb

## 2013-01-01 DIAGNOSIS — I4891 Unspecified atrial fibrillation: Secondary | ICD-10-CM

## 2013-01-01 NOTE — Patient Instructions (Signed)
Your physician wants you to follow-up in: 9 months with Dr Allred You will receive a reminder letter in the mail two months in advance. If you don't receive a letter, please call our office to schedule the follow-up appointment.  

## 2013-01-01 NOTE — Progress Notes (Signed)
PCP:  Vickey Sages, MD  The patient presents today for routine electrophysiology followup.  Since last being seen in our clinic, the patient reports doing very well.  She continues to have short episodes of palpitations.    Her Metoprolol dose was increased at her last office visit and this has been helpful.    Today, she denies symptoms of chest pain, shortness of breath, orthopnea, PND, lower extremity edema, dizziness, presyncope, syncope, or neurologic sequela.  The patient feels that she is tolerating medications without difficulties and is otherwise without complaint today.   Past Medical History  Diagnosis Date  . Atrial fibrillation     s/p ablation 02/21/10  . S/P ablation of atrial flutter 02/21/2010  . Hyperlipidemia   . Allergic rhinitis    Past Surgical History  Procedure Laterality Date  . Cardiac catheterization  04/18/2009    Normal coronary arteries, EF 60%, 1+ MR  . Ep study  02/20/2010    CTI and PVI ablation by [facility]    Current Outpatient Prescriptions  Medication Sig Dispense Refill  . aspirin 81 MG tablet Take 81 mg by mouth daily.      . calcium-vitamin D (OSCAL 500/200 D-3) 500-200 MG-UNIT per tablet Take 1 tablet by mouth 3 (three) times daily.        . Cyanocobalamin (VITAMIN B-12 IJ) Inject as directed. Every 30 days       . latanoprost (XALATAN) 0.005 % ophthalmic solution 1 drop at bedtime.        . metoprolol tartrate (LOPRESSOR) 25 MG tablet Take 1 tablet (25 mg total) by mouth 2 (two) times daily.  180 tablet  2  . nitroGLYCERIN (NITROSTAT) 0.4 MG SL tablet Place 0.4 mg under the tongue as directed.         No current facility-administered medications for this visit.    No Known Allergies  History   Social History  . Marital Status: Married    Spouse Name: N/A    Number of Children: N/A  . Years of Education: N/A   Occupational History  . Part time house cleaner    Social History Main Topics  . Smoking status: Never Smoker   . Smokeless  tobacco: Not on file  . Alcohol Use: No  . Drug Use: Not on file  . Sexually Active: Not on file   Other Topics Concern  . Not on file   Social History Narrative   Married   Lives with spouse in [place], [place]   Has 2 grown children    Family History  Problem Relation Age of Onset  . Heart failure Mother   . Colon cancer Father   . Coronary artery disease Neg Hx     ROS-  All systems are reviewed and are negative except as outlined in the HPI above  Physical Exam: Filed Vitals:   01/01/13 1037  BP: 110/75  Pulse: 63  Height: 5\' 5"  (1.651 m)  Weight: 148 lb 12.8 oz (67.495 kg)    GEN- The patient is well appearing, alert and oriented x 3 today.   Head- normocephalic, atraumatic Eyes-  Sclera clear, conjunctiva pink Ears- hearing intact Oropharynx- clear Neck- supple, no JVP Lymph- no cervical lymphadenopathy Lungs- Clear to ausculation bilaterally, normal work of breathing Heart- Regular rate and rhythm, no murmurs, rubs or gallops, PMI not laterally displaced GI- soft, NT, ND, + BS Extremities- no clubbing, cyanosis, or edema MS- no significant deformity or atrophy Skin- no rash or lesion  Psych- euthymic mood, full affect Neuro- strength and sensation are intact  EKG- sinus rhythm  Assessment and Plan:  1. afib Well controlled No changes  Return in 9 months

## 2013-03-16 ENCOUNTER — Other Ambulatory Visit: Payer: Self-pay | Admitting: Internal Medicine

## 2013-03-16 ENCOUNTER — Telehealth: Payer: Self-pay | Admitting: Internal Medicine

## 2013-03-16 MED ORDER — METOPROLOL TARTRATE 25 MG PO TABS: 25.0000 mg | ORAL_TABLET | Freq: Two times a day (BID) | ORAL | Status: DC

## 2013-03-16 NOTE — Telephone Encounter (Signed)
New Problem:  Pt states she is calling in for a refill.

## 2013-09-11 ENCOUNTER — Other Ambulatory Visit: Payer: Self-pay | Admitting: Internal Medicine

## 2013-12-09 ENCOUNTER — Other Ambulatory Visit: Payer: Self-pay | Admitting: Internal Medicine

## 2014-01-09 ENCOUNTER — Other Ambulatory Visit: Payer: Self-pay | Admitting: Internal Medicine

## 2014-01-14 ENCOUNTER — Other Ambulatory Visit: Payer: Self-pay | Admitting: *Deleted

## 2014-01-14 MED ORDER — METOPROLOL TARTRATE 25 MG PO TABS: ORAL_TABLET | ORAL | Status: DC

## 2014-03-04 ENCOUNTER — Ambulatory Visit (INDEPENDENT_AMBULATORY_CARE_PROVIDER_SITE_OTHER): Payer: Medicare Other | Admitting: Internal Medicine

## 2014-03-04 ENCOUNTER — Encounter: Payer: Self-pay | Admitting: Internal Medicine

## 2014-03-04 VITALS — BP 112/68 | HR 56 | Ht 65.5 in | Wt 153.4 lb

## 2014-03-04 DIAGNOSIS — I4891 Unspecified atrial fibrillation: Secondary | ICD-10-CM

## 2014-03-04 MED ORDER — NITROGLYCERIN 0.4 MG SL SUBL
0.4000 mg | SUBLINGUAL_TABLET | SUBLINGUAL | Status: DC | PRN
Start: 2014-03-04 — End: 2019-03-27

## 2014-03-04 MED ORDER — METOPROLOL SUCCINATE ER 25 MG PO TB24: 25.0000 mg | ORAL_TABLET | Freq: Every day | ORAL | Status: DC

## 2014-03-04 MED ORDER — METOPROLOL TARTRATE 25 MG PO TABS: 25.0000 mg | ORAL_TABLET | ORAL | Status: DC | PRN

## 2014-03-04 NOTE — Patient Instructions (Signed)
Your physician wants you to follow-up in: 6 months with Dr Jacquiline DoeAllred You will receive a reminder letter in the mail two months in advance. If you don't receive a letter, please call our office to schedule the follow-up appointment.   Your physician has recommended you make the following change in your medication:  1) Start Metoprolol Succ 25mg  daily--can take the Metoprolol Tartrate as needed

## 2014-03-04 NOTE — Addendum Note (Signed)
Addended by: Dennis BastLANIER, Edson Deridder F on: 03/04/2014 12:42 PM   Modules accepted: Orders

## 2014-03-04 NOTE — Progress Notes (Signed)
PCP:  Vickey SagesBUHR,ROBERT, MD  The patient presents today for routine electrophysiology followup.  Since last being seen in our clinic, the patient reports doing very well.  Her palpitations are stable at this time. Today, she denies symptoms of chest pain, shortness of breath, orthopnea, PND, lower extremity edema, dizziness, presyncope, syncope, or neurologic sequela.  The patient feels that she is tolerating medications without difficulties and is otherwise without complaint today.   Past Medical History  Diagnosis Date  . Atrial fibrillation     s/p ablation 02/21/10  . S/P ablation of atrial flutter 02/21/2010  . Hyperlipidemia   . Allergic rhinitis    Past Surgical History  Procedure Laterality Date  . Cardiac catheterization  04/18/2009    Normal coronary arteries, EF 60%, 1+ MR  . Ep study  02/20/2010    [facility]    Current Outpatient Prescriptions  Medication Sig Dispense Refill  . aspirin 81 MG tablet Take 81 mg by mouth daily. Pt states she is only taking "once in a while, not often" (03/04/14)      . calcium-vitamin D (OSCAL 500/200 D-3) 500-200 MG-UNIT per tablet Take 1 tablet by mouth 3 (three) times daily.        . Cyanocobalamin (VITAMIN B-12 IJ) Inject as directed. Every 30 days       . latanoprost (XALATAN) 0.005 % ophthalmic solution Place 1 drop into both eyes at bedtime.       . metoprolol tartrate (LOPRESSOR) 25 MG tablet Take 1 tablet (25 mg total) by mouth as needed. TAKE 1 TABLET BY MOUTH TWICE A DAY  60 tablet  1  . nitroGLYCERIN (NITROSTAT) 0.4 MG SL tablet Place 0.4 mg under the tongue every 5 (five) minutes as needed for chest pain (MAX 3 TABLETS).       . metoprolol succinate (TOPROL-XL) 25 MG 24 hr tablet Take 1 tablet (25 mg total) by mouth daily. Take with or immediately following a meal.  90 tablet  3   No current facility-administered medications for this visit.    No Known Allergies  History   Social History  . Marital  Status: Married    Spouse Name: N/A    Number of Children: N/A  . Years of Education: N/A   Occupational History  . Part time house cleaner    Social History Main Topics  . Smoking status: Never Smoker   . Smokeless tobacco: Not on file  . Alcohol Use: No  . Drug Use: Not on file  . Sexual Activity: Not on file   Other Topics Concern  . Not on file   Social History Narrative   Married   [place]   Has 2 grown children    Family History  Problem Relation Age of Onset  . Heart failure Mother   . Colon cancer Father   . Coronary artery disease Neg Hx     ROS-  + weight gain, + chronic visual changes, + poor hearing, + rare SOB.  All other systems are reviewed and are negative except as outlined in the HPI above  Physical Exam: Filed Vitals:   03/04/14 1024  BP: 112/68  Pulse: 56  Height: 5' 5.5" (1.664 m)  Weight: 153 lb 6.4 oz (69.582 kg)    GEN- The patient is well appearing, alert and oriented x 3 today.   Head- normocephalic, atraumatic Eyes-  Sclera clear, conjunctiva pink Ears- hearing  intact Oropharynx- clear Neck- supple, no JVP Lymph- no cervical lymphadenopathy Lungs- Clear to ausculation bilaterally, normal work of breathing Heart- Regular rate and rhythm, no murmurs, rubs or gallops, PMI not laterally displaced GI- soft, NT, ND, + BS Extremities- no clubbing, cyanosis, or edema MS- no significant deformity or atrophy Skin- no rash or lesion Psych- euthymic mood, full affect Neuro- strength and sensation are intact  EKG- sinus rhythm  Assessment and Plan:  1. afib Well controlled No changes She has pacs which are worse when her metorolol is due. I will therefore switch to metoprolol succinate 25mg  daily. She can take metoprolol tartrate as needed Her chads2vasc score is at least 2.  Today, I discussed Coumadin as well as novel anticoagulants including Pradaxa, Xarelto, Savaysa, and Eliquis today as indicated for risk  reduction in stroke and systemic emboli with nonvalvular atrial fibrillation.  Risks, benefits, and alternatives to each of these drugs were discussed at length today.  She is clear in her decision to decline anticoagulation at this time.  She prefers ASA.    Return in 6 months

## 2014-03-26 ENCOUNTER — Other Ambulatory Visit: Payer: Self-pay | Admitting: Internal Medicine

## 2014-07-03 ENCOUNTER — Other Ambulatory Visit: Payer: Self-pay

## 2014-07-03 ENCOUNTER — Other Ambulatory Visit: Payer: Self-pay | Admitting: Internal Medicine

## 2014-07-03 MED ORDER — METOPROLOL TARTRATE 25 MG PO TABS: 25.0000 mg | ORAL_TABLET | Freq: Two times a day (BID) | ORAL | Status: DC

## 2014-09-04 ENCOUNTER — Ambulatory Visit (INDEPENDENT_AMBULATORY_CARE_PROVIDER_SITE_OTHER): Payer: Medicare Other | Admitting: Internal Medicine

## 2014-09-04 ENCOUNTER — Encounter: Payer: Self-pay | Admitting: Internal Medicine

## 2014-09-04 VITALS — BP 118/64 | HR 55 | Ht 65.5 in | Wt 155.2 lb

## 2014-09-04 DIAGNOSIS — I4891 Unspecified atrial fibrillation: Secondary | ICD-10-CM | POA: Diagnosis not present

## 2014-09-04 NOTE — Patient Instructions (Signed)
Your physician wants you to follow-up in: 12 months with Dr Allred You will receive a reminder letter in the mail two months in advance. If you don't receive a letter, please call our office to schedule the follow-up appointment.  

## 2014-09-04 NOTE — Progress Notes (Signed)
Electrophysiology Office Note   Date:  09/04/2014   ID:  Jessica CreteLouise W Nehring, DOB 03/24/1946, MRN [id number]  PCP:  Vickey SagesBUHR,ROBERT, MD  Primary Electrophysiologist: Hillis RangeJames Taneya Conkel, MD    Chief Complaint  Patient presents with  . Atrial Fibrillation     History of Present Illness: Jessica Fowler is a 69 y.o. female who presents today for electrophysiology evaluation.   She is doing well.  AFib is controlled.  She has pacs/ pvcs which cause palpitations.  Today, she denies symptoms of chest pain, shortness of breath, orthopnea, PND, lower extremity edema, claudication, dizziness, presyncope, syncope, bleeding, or neurologic sequela. The patient is tolerating medications without difficulties and is otherwise without complaint today.    Past Medical History  Diagnosis Date  . Atrial fibrillation     s/p ablation 02/21/10  . S/P ablation of atrial flutter 02/21/2010  . Hyperlipidemia   . Allergic rhinitis    Past Surgical History  Procedure Laterality Date  . Cardiac catheterization  04/18/2009    Normal coronary arteries, EF 60%, 1+ MR  . Ep study  02/20/2010    [facility]     Current Outpatient Prescriptions  Medication Sig Dispense Refill  . aspirin 81 MG tablet Take 81 mg by mouth daily. Pt states she is only taking "once in a while, not often" (03/04/14)    . calcium-vitamin D (OSCAL 500/200 D-3) 500-200 MG-UNIT per tablet Take 1 tablet by mouth 3 (three) times daily.      . Cyanocobalamin (VITAMIN B-12 IJ) Inject as directed. Every 30 days     . latanoprost (XALATAN) 0.005 % ophthalmic solution Place 1 drop into both eyes at bedtime.     . metoprolol succinate (TOPROL-XL) 25 MG 24 hr tablet Take 25 mg by mouth every evening.    . metoprolol tartrate (LOPRESSOR) 25 MG tablet Take 25 mg by mouth every morning.    . nitroGLYCERIN (NITROSTAT) 0.4 MG SL tablet Place 1 tablet (0.4 mg total) under the tongue every 5 (five) minutes as needed for chest pain (MAX 3 TABLETS).  30 tablet 11   No current facility-administered medications for this visit.    Allergies:   Review of patient's allergies indicates no known allergies.   Social History:  The patient  reports that she has never smoked. She does not have any smokeless tobacco history on file. She reports that she does not drink alcohol.   Family History:  The patient's family history includes Colon cancer in her father; Heart failure in her mother; Hyperthyroidism in her son; Hypothyroidism in her daughter. There is no history of Coronary artery disease.    ROS:  Please see the history of present illness.   All other systems are reviewed and negative.    PHYSICAL EXAM: VS:  BP 118/64 mmHg  Pulse 55  Ht 5' 5.5" (1.664 m)  Wt 155 lb 3.2 oz (70.398 kg)  BMI 25.42 kg/m2 , BMI Body mass index is 25.42 kg/(m^2). GEN: Well nourished, well developed, in no acute distress HEENT: normal Neck: no JVD, carotid bruits, or masses Cardiac: RRR; no murmurs, rubs, or gallops,no edema  Respiratory:  clear to auscultation bilaterally, normal work of breathing GI: soft, nontender, nondistended, + BS MS: no deformity or atrophy Skin: warm and dry, device pocket is well healed Neuro:  Strength and sensation are intact Psych: euthymic mood, full affect  EKG:  EKG is ordered today. The ekg ordered today shows sinus bradycardia  Device  interrogation is reviewed today in detail.  See PaceArt for details.   Recent Labs: No results found for requested labs within last 365 days.    Lipid Panel  No results found for: CHOL, TRIG, HDL, CHOLHDL, VLDL, LDLCALC, LDLDIRECT   Wt Readings from Last 3 Encounters:  09/04/14 155 lb 3.2 oz (70.398 kg)  03/04/14 153 lb 6.4 oz (69.582 kg)  01/01/13 148 lb 12.8 oz (67.495 kg)     ASSESSMENT AND PLAN:   1. afib Well controlled No changes She has pacs which are reasonably well controlled with metoprolol She can take metoprolol tartrate as needed Her chads2vasc score is  at least 2. She has declined anticoagulation No changes today   Current medicines are reviewed at length with the patient today.   The patient does not have concerns regarding her medicines.  The following changes were made today:  none   Follow-up: return to see me in 1 year  Signed, Hillis Range, MD  09/04/2014 11:33 AM     [facility] [address] [place] [address] [phone] (office) [phone] (fax)=n

## 2014-12-19 ENCOUNTER — Other Ambulatory Visit: Payer: Self-pay | Admitting: Internal Medicine

## 2015-03-31 ENCOUNTER — Other Ambulatory Visit: Payer: Self-pay | Admitting: Internal Medicine

## 2015-07-20 ENCOUNTER — Emergency Department (HOSPITAL_COMMUNITY)
Admission: EM | Admit: 2015-07-20 | Discharge: 2015-07-20 | Disposition: A | Discharge status: Home / Self Care | Payer: Medicare Other | Attending: Emergency Medicine | Admitting: Emergency Medicine

## 2015-07-20 ENCOUNTER — Emergency Department (HOSPITAL_COMMUNITY): Payer: Medicare Other

## 2015-07-20 ENCOUNTER — Emergency Department (HOSPITAL_COMMUNITY)
Admission: EM | Admit: 2015-07-20 | Discharge: 2015-07-21 | Disposition: A | Discharge status: Home / Self Care | Payer: Medicare Other | Attending: Emergency Medicine | Admitting: Emergency Medicine

## 2015-07-20 ENCOUNTER — Encounter (HOSPITAL_COMMUNITY): Payer: Self-pay | Admitting: Nurse Practitioner

## 2015-07-20 ENCOUNTER — Encounter (HOSPITAL_COMMUNITY): Payer: Self-pay | Admitting: Emergency Medicine

## 2015-07-20 DIAGNOSIS — R000 Tachycardia, unspecified: Secondary | ICD-10-CM | POA: Insufficient documentation

## 2015-07-20 DIAGNOSIS — J189 Pneumonia, unspecified organism: Secondary | ICD-10-CM

## 2015-07-20 DIAGNOSIS — J90 Pleural effusion, not elsewhere classified: Secondary | ICD-10-CM

## 2015-07-20 DIAGNOSIS — R0602 Shortness of breath: Secondary | ICD-10-CM | POA: Insufficient documentation

## 2015-07-20 DIAGNOSIS — I4892 Unspecified atrial flutter: Secondary | ICD-10-CM | POA: Insufficient documentation

## 2015-07-20 DIAGNOSIS — Z79899 Other long term (current) drug therapy: Secondary | ICD-10-CM | POA: Diagnosis not present

## 2015-07-20 DIAGNOSIS — I4891 Unspecified atrial fibrillation: Secondary | ICD-10-CM | POA: Diagnosis not present

## 2015-07-20 DIAGNOSIS — R61 Generalized hyperhidrosis: Secondary | ICD-10-CM | POA: Diagnosis not present

## 2015-07-20 DIAGNOSIS — Z7982 Long term (current) use of aspirin: Secondary | ICD-10-CM | POA: Diagnosis not present

## 2015-07-20 DIAGNOSIS — E785 Hyperlipidemia, unspecified: Secondary | ICD-10-CM | POA: Diagnosis not present

## 2015-07-20 DIAGNOSIS — R42 Dizziness and giddiness: Secondary | ICD-10-CM | POA: Diagnosis not present

## 2015-07-20 DIAGNOSIS — J159 Unspecified bacterial pneumonia: Secondary | ICD-10-CM | POA: Diagnosis not present

## 2015-07-20 DIAGNOSIS — Z9889 Other specified postprocedural states: Secondary | ICD-10-CM | POA: Insufficient documentation

## 2015-07-20 DIAGNOSIS — R079 Chest pain, unspecified: Secondary | ICD-10-CM | POA: Insufficient documentation

## 2015-07-20 HISTORY — DX: Pneumonia, unspecified organism: J18.9

## 2015-07-20 LAB — BASIC METABOLIC PANEL
Anion gap: 14 (ref 5–15)
CHLORIDE: 105 mmol/L (ref 101–111)
CO2: 22 mmol/L (ref 22–32)
CREATININE: 0.68 mg/dL (ref 0.44–1.00)
Calcium: 9.6 mg/dL (ref 8.9–10.3)
Glucose, Bld: 109 mg/dL — ABNORMAL HIGH (ref 65–99)
POTASSIUM: 3.9 mmol/L (ref 3.5–5.1)
SODIUM: 141 mmol/L (ref 135–145)

## 2015-07-20 LAB — DIFFERENTIAL
BASOS ABS: 0 10*3/uL (ref 0.0–0.1)
Basophils Relative: 0 %
EOS ABS: 0 10*3/uL (ref 0.0–0.7)
EOS PCT: 0 %
Lymphocytes Relative: 14 %
Lymphs Abs: 1.6 10*3/uL (ref 0.7–4.0)
Monocytes Absolute: 0.9 10*3/uL (ref 0.1–1.0)
Monocytes Relative: 8 %
NEUTROS PCT: 78 %
Neutro Abs: 8.4 10*3/uL — ABNORMAL HIGH (ref 1.7–7.7)

## 2015-07-20 LAB — URINALYSIS, ROUTINE W REFLEX MICROSCOPIC
BILIRUBIN URINE: NEGATIVE
Glucose, UA: NEGATIVE mg/dL
HGB URINE DIPSTICK: NEGATIVE
KETONES UR: 15 mg/dL — AB
NITRITE: NEGATIVE
PROTEIN: NEGATIVE mg/dL
Specific Gravity, Urine: 1.021 (ref 1.005–1.030)
pH: 5.5 (ref 5.0–8.0)

## 2015-07-20 LAB — CBC
HCT: 41.9 % (ref 36.0–46.0)
HEMOGLOBIN: 13.8 g/dL (ref 12.0–15.0)
MCH: 31.2 pg (ref 26.0–34.0)
MCHC: 32.9 g/dL (ref 30.0–36.0)
MCV: 94.8 fL (ref 78.0–100.0)
PLATELETS: 295 10*3/uL (ref 150–400)
RBC: 4.42 MIL/uL (ref 3.87–5.11)
RDW: 12.3 % (ref 11.5–15.5)
WBC: 11 10*3/uL — ABNORMAL HIGH (ref 4.0–10.5)

## 2015-07-20 LAB — URINE MICROSCOPIC-ADD ON
Bacteria, UA: NONE SEEN
RBC / HPF: NONE SEEN RBC/hpf (ref 0–5)

## 2015-07-20 LAB — I-STAT TROPONIN, ED
TROPONIN I, POC: 0 ng/mL (ref 0.00–0.08)
TROPONIN I, POC: 0 ng/mL (ref 0.00–0.08)

## 2015-07-20 LAB — D-DIMER, QUANTITATIVE (NOT AT ARMC): D DIMER QUANT: 0.47 ug{FEU}/mL (ref 0.00–0.50)

## 2015-07-20 MED ORDER — LEVOFLOXACIN 750 MG PO TABS: 750.0000 mg | ORAL_TABLET | Freq: Every day | ORAL | Status: DC

## 2015-07-20 NOTE — ED Notes (Addendum)
Pt reports 4 day history of pain under R breast and into R shoulder, pain increased with movement and feels hard to move her arm. Yesterday evening she began to have a fever, sob, and felt her heart racing. Denies cough. She took tylenol last night with symptom reduction. She is A&Ox4, resp e/u

## 2015-07-20 NOTE — ED Provider Notes (Signed)
CSN: [id number]     Arrival date & time 07/20/15  1301 History   First MD Initiated Contact with Patient 07/20/15 1603     Chief Complaint  Patient presents with  . Shortness of Breath  . Fever  . Tachycardia     (Consider location/radiation/quality/duration/timing/severity/associated sxs/prior Treatment) HPI 70 year old female who presents with shortness of breath and palpitations. History of atrial fibrillation status post ablation, not on anticoagulation. States that she has recently been on a road trip, and 2 days ago developed pain underneath her right breast that radiated towards her right shoulder. Initially thought that this was more likely muscle strain as it was worse with movement. Yesterday I had an episode where she had palpitations, lightheadedness, and shortness of breath. Said that of feeling of subjective fever with chills preceded this episode. States that it lasted an hour before self resolving. Had another episode at 4 AM in the morning that resolved after an hour, and then while driving home another episode this afternoon that lasted for about an hour. States that she has had symptomatic atrial fibrillation in the past, but this feels more severe than that. Denies that symptoms are worse with exertion. Does state that with deep inspiration, chest pain is worse. Chest pain has been constant over past 2 days despite episodic palpitations and dyspnea. In between episodes states that she feels at baseline. Denies any lower extremity edema, leg pain, orthopnea, PND, dyspnea on exertion. She has not had cough, congestion, sore throat or runny nose, but states that she also has had history of pneumonia that presented very similarly. No history of family history of PE/DVT.   Past Medical History  Diagnosis Date  . Atrial fibrillation (HCC)     s/p ablation 02/21/10  . S/P ablation of atrial flutter 02/21/2010  . Hyperlipidemia   . Allergic rhinitis   . Pneumonia    Past Surgical  History  Procedure Laterality Date  . Cardiac catheterization  04/18/2009    Normal coronary arteries, EF 60%, 1+ MR  . Ep study  02/20/2010    CTI and PVI ablation by [facility]   Family History  Problem Relation Age of Onset  . Heart failure Mother   . Colon cancer Father   . Coronary artery disease Neg Hx   . Hyperthyroidism Son   . Hypothyroidism Daughter    Social History  Substance Use Topics  . Smoking status: Never Smoker   . Smokeless tobacco: None  . Alcohol Use: No   OB History    No data available     Review of Systems 10/14 systems reviewed and are negative other than those stated in the HPI    Allergies  Review of patient's allergies indicates no known allergies.  Home Medications   Prior to Admission medications   Medication Sig Start Date End Date Taking? Authorizing Provider  aspirin 81 MG tablet Take 81 mg by mouth daily. Pt states she is only taking "once in a while, not often" (03/04/14)    Historical Provider, MD  calcium-vitamin D (OSCAL 500/200 D-3) 500-200 MG-UNIT per tablet Take 1 tablet by mouth 3 (three) times daily.      Historical Provider, MD  Cyanocobalamin (VITAMIN B-12 IJ) Inject as directed. Every 30 days     Historical Provider, MD  latanoprost (XALATAN) 0.005 % ophthalmic solution Place 1 drop into both eyes at bedtime.     Historical Provider, MD  metoprolol succinate (TOPROL-XL) 25 MG 24 hr tablet TAKE 1 TABLET  BY MOUTH WITH OR IMMEDIATELY FOLLOWING MEAL 12/19/14   Hillis Range, MD  metoprolol tartrate (LOPRESSOR) 25 MG tablet Take 25 mg by mouth every morning.    Historical Provider, MD  metoprolol tartrate (LOPRESSOR) 25 MG tablet TAKE 1 TABLET BY MOUTH TWICE DAILY 04/01/15   Hillis Range, MD  nitroGLYCERIN (NITROSTAT) 0.4 MG SL tablet Place 1 tablet (0.4 mg total) under the tongue every 5 (five) minutes as needed for chest pain (MAX 3 TABLETS). 03/04/14   Hillis Range, MD   BP 115/79 mmHg  Pulse 79  Temp(Src) 98.4 F (36.9 C) (Oral)  Resp  17  SpO2 97% Physical Exam Physical Exam  Nursing note and vitals reviewed. Constitutional: Well developed, well nourished, non-toxic, and in no acute distress Head: Normocephalic and atraumatic.  Mouth/Throat: Oropharynx is clear and moist.  Neck: Normal range of motion. Neck supple.  Cardiovascular: Normal rate and regular rhythm.   Pulmonary/Chest: Effort normal and breath sounds normal.  Abdominal: Soft. There is no tenderness. There is no rebound and no guarding.  Musculoskeletal: Normal range of motion.  Neurological: Alert, no facial droop, fluent speech, moves all extremities symmetrically Skin: Skin is warm and dry.  Psychiatric: Cooperative  ED Course  Procedures (including critical care time) Labs Review Labs Reviewed  BASIC METABOLIC PANEL - Abnormal; Notable for the following:    Glucose, Bld 109 (*)    BUN <5 (*)    All other components within normal limits  CBC - Abnormal; Notable for the following:    WBC 11.0 (*)    All other components within normal limits  DIFFERENTIAL  D-DIMER, QUANTITATIVE (NOT AT [facility])  URINALYSIS, ROUTINE W REFLEX MICROSCOPIC (NOT AT [facility])  Rosezena Sensor, ED    Imaging Review Dg Chest 2 View  07/20/2015  CLINICAL DATA:  Shortness breath and fever since yesterday. Pain under right breast as well as right shoulder 2 days. EXAM: CHEST  2 VIEW COMPARISON:  07/30/2009 FINDINGS: Lungs are adequately inflated with minimal bibasilar opacification which may be due to atelectasis versus infection. There is a small right pleural effusion. Cardiomediastinal silhouette is within normal. There is mild degenerative change of the spine. IMPRESSION: Small right pleural effusion. Mild bibasilar opacification which may be due to pneumonia versus atelectasis. Electronically Signed   By: Elberta Fortis M.D.   On: 07/20/2015 14:18   I have personally reviewed and evaluated these images and lab results as part of my medical decision-making.   EKG  Interpretation   Date/Time:  Sunday July 20 2015 13:27:44 EST Ventricular Rate:  73 PR Interval:  110 QRS Duration: 78 QT Interval:  390 QTC Calculation: 429 R Axis:   29 Text Interpretation:  Sinus rhythm with short PR Otherwise normal ECG No  significant change since last tracing Confirmed by Kaydence Menard MD, Annabelle Harman ([id number]) on  07/20/2015 4:05:00 PM      MDM   Final diagnoses:  None    70 year old female with history of AF s/p ablation who presents with episodic palpitations, shortness of breath with chest pain over 2 days. States that she feels asymptomatic on arrival. VS stable and she is afebrile. EKG with normal sinus rhythm. No evidence of ischemia/infarction or heart strain.   CXR with small right pleural effusion and opacity that could be PNA vs atelectasis. Given similar symptoms with PNA, will treat with antibiotics for CAP. Blood work notable for mild leukocytosis of 11. No major electrolyte or metabolic derangements. Negative UA.  Serial troponins are negative  and serial EKGs without acute dynamic changes. I suspect that her pain likely a component of pleurisy in the setting of her effusion,  with right shoulder pain from likely diaphragmatic referral pain. Atypical for ACS and low risk.  D-dimer negative, and low risk for PE. It is adequately ruled out for this. Pain atypical and history/exam/w/u not concern for dissection. Stable for discharge home. Strict return and follow-up instructions reviewed. She expressed understanding of all discharge instructions and felt comfortable with the plan of care.    Lavera Guise, MD 07/20/15 Rickey Primus

## 2015-07-20 NOTE — Discharge Instructions (Signed)
Your Chest x-ray shows a little fluid at the base of your right lung and possible small pneumonia. You are given course of antibiotics. Return without fail for worsening symptoms, including persistent fever, vomiting and unable to keep down food/fluids, confusion, difficulty breathing, worsening pain, or any other symptoms concerning to you.    Community-Acquired Pneumonia, Adult Pneumonia is an infection of the lungs. There are different types of pneumonia. One type can develop while a person is in a hospital. A different type, called community-acquired pneumonia, develops in people who are not, or have not recently been, in the hospital or other health care facility.  CAUSES Pneumonia may be caused by bacteria, viruses, or funguses. Community-acquired pneumonia is often caused by Streptococcus pneumonia bacteria. These bacteria are often passed from one person to another by breathing in droplets from the cough or sneeze of an infected person. RISK FACTORS The condition is more likely to develop in:  People who havechronic diseases, such as chronic obstructive pulmonary disease (COPD), asthma, congestive heart failure, cystic fibrosis, diabetes, or kidney disease.  People who haveearly-stage or late-stage HIV.  People who havesickle cell disease.  People who havehad their spleen removed (splenectomy).  People who havepoor Administrator.  People who havemedical conditions that increase the risk of breathing in (aspirating) secretions their own mouth and nose.   People who havea weakened immune system (immunocompromised).  People who smoke.  People whotravel to areas where pneumonia-causing germs commonly exist.  People whoare around animal habitats or animals that have pneumonia-causing germs, including birds, bats, rabbits, cats, and farm animals. SYMPTOMS Symptoms of this condition include:  Adry cough.  A wet (productive) cough.  Fever.  Sweating.  Chest pain,  especially when breathing deeply or coughing.  Rapid breathing or difficulty breathing.  Shortness of breath.  Shaking chills.  Fatigue.  Muscle aches. DIAGNOSIS Your health care provider will take a medical history and perform a physical exam. You may also have other tests, including:  Imaging studies of your chest, including X-rays.  Tests to check your blood oxygen level and other blood gases.  Other tests on blood, mucus (sputum), fluid around your lungs (pleural fluid), and urine. If your pneumonia is severe, other tests may be done to identify the specific cause of your illness. TREATMENT The type of treatment that you receive depends on many factors, such as the cause of your pneumonia, the medicines you take, and other medical conditions that you have. For most adults, treatment and recovery from pneumonia may occur at home. In some cases, treatment must happen in a hospital. Treatment may include:  Antibiotic medicines, if the pneumonia was caused by bacteria.  Antiviral medicines, if the pneumonia was caused by a virus.  Medicines that are given by mouth or through an IV tube.  Oxygen.  Respiratory therapy. Although rare, treating severe pneumonia may include:  Mechanical ventilation. This is done if you are not breathing well on your own and you cannot maintain a safe blood oxygen level.  Thoracentesis. This procedureremoves fluid around one lung or both lungs to help you breathe better. HOME CARE INSTRUCTIONS  Take over-the-counter and prescription medicines only as told by your health care provider.  Only takecough medicine if you are losing sleep. Understand that cough medicine can prevent your body's natural ability to remove mucus from your lungs.  If you were prescribed an antibiotic medicine, take it as told by your health care provider. Do not stop taking the antibiotic even if you start  to feel better.  Sleep in a semi-upright position at night. Try  sleeping in a reclining chair, or place a few pillows under your head.  Do not use tobacco products, including cigarettes, chewing tobacco, and e-cigarettes. If you need help quitting, ask your health care provider.  Drink enough water to keep your urine clear or pale yellow. This will help to thin out mucus secretions in your lungs. PREVENTION There are ways that you can decrease your risk of developing community-acquired pneumonia. Consider getting a pneumococcal vaccine if:  You are older than 70 years of age.  You are older than 70 years of age and are undergoing cancer treatment, have chronic lung disease, or have other medical conditions that affect your immune system. Ask your health care provider if this applies to you. There are different types and schedules of pneumococcal vaccines. Ask your health care provider which vaccination option is best for you. You may also prevent community-acquired pneumonia if you take these actions:  Get an influenza vaccine every year. Ask your health care provider which type of influenza vaccine is best for you.  Go to the dentist on a regular basis.  Wash your hands often. Use hand sanitizer if soap and water are not available. SEEK MEDICAL CARE IF:  You have a fever.  You are losing sleep because you cannot control your cough with cough medicine. SEEK IMMEDIATE MEDICAL CARE IF:  You have worsening shortness of breath.  You have increased chest pain.  Your sickness becomes worse, especially if you are an older adult or have a weakened immune system.  You cough up blood.   This information is not intended to replace advice given to you by your health care provider. Make sure you discuss any questions you have with your health care provider.   Document Released: 05/17/2005 Document Revised: 02/05/2015 Document Reviewed: 09/11/2014 Elsevier Interactive Patient Education [employer]

## 2015-07-20 NOTE — ED Notes (Signed)
The patient was seen here for pain in the breast, right shoulder and tachycardia.  She was discharged with prescriptions.  She did not make it home when she started feeling like her heart "was going real fast and turning over in her chest".   She is also feeling SOB, lightheaded, diaphoretic and heaviness in her chest.  She is back to be evaluated.

## 2015-10-29 ENCOUNTER — Ambulatory Visit: Payer: Medicare Other | Admitting: Internal Medicine

## 2015-12-08 ENCOUNTER — Encounter: Payer: Self-pay | Admitting: Internal Medicine

## 2015-12-08 ENCOUNTER — Ambulatory Visit (INDEPENDENT_AMBULATORY_CARE_PROVIDER_SITE_OTHER): Payer: Medicare Other | Admitting: Internal Medicine

## 2015-12-08 VITALS — BP 124/66 | HR 72 | Ht 65.5 in | Wt 149.6 lb

## 2015-12-08 DIAGNOSIS — R0789 Other chest pain: Secondary | ICD-10-CM

## 2015-12-08 DIAGNOSIS — R079 Chest pain, unspecified: Secondary | ICD-10-CM | POA: Diagnosis not present

## 2015-12-08 DIAGNOSIS — I4891 Unspecified atrial fibrillation: Secondary | ICD-10-CM | POA: Diagnosis not present

## 2015-12-08 NOTE — Progress Notes (Signed)
Electrophysiology Office Note   Date:  12/08/2015   ID:  Jessica Fowler, DOB 10/19/1945, MRN [id number]  PCP:  Vickey SagesBUHR,ROBERT, MD  Primary Electrophysiologist: Hillis RangeJames Sly Parlee, MD    Chief Complaint  Patient presents with  . Atrial Fibrillation     History of Present Illness: Jessica Fowler is a [age] y.o. female who presents today for electrophysiology evaluation.   She is doing well.  AFib is controlled.  She has pacs/ pvcs which cause palpitations.  She has occasional palpitations which she attributes to "anxiety".  She has also noted occasional chest tightness at rest.  Denies exertional symptoms.  Today, she denies symptoms of shortness of breath, orthopnea, PND, lower extremity edema, claudication, dizziness, presyncope, syncope, bleeding, or neurologic sequela. The patient is tolerating medications without difficulties and is otherwise without complaint today.    Past Medical History  Diagnosis Date  . Atrial fibrillation (HCC)     s/p ablation 02/21/10  . S/P ablation of atrial flutter 02/21/2010  . Hyperlipidemia   . Allergic rhinitis   . Pneumonia    Past Surgical History  Procedure Laterality Date  . Cardiac catheterization  04/18/2009    Normal coronary arteries, EF 60%, 1+ MR  . Ep study  02/20/2010    [facility]     Current Outpatient Prescriptions  Medication Sig Dispense Refill  . aspirin 81 MG tablet Take 81 mg by mouth once a week. Pt states she is only taking about once a week    . calcium-vitamin D (OSCAL 500/200 D-3) 500-200 MG-UNIT per tablet Take 1 tablet by mouth 3 (three) times daily.      . Cyanocobalamin (VITAMIN B-12 IJ) Inject as directed. Every 30 days     . fluticasone (FLONASE) 50 MCG/ACT nasal spray Place 2 sprays into both nostrils daily.    Marland Kitchen. latanoprost (XALATAN) 0.005 % ophthalmic solution Place 1 drop into both eyes at bedtime.     Marland Kitchen. levofloxacin (LEVAQUIN) 750 MG tablet Take 1 tablet (750 mg total) by mouth daily. 5 tablet 0   . metoprolol succinate (TOPROL-XL) 25 MG 24 hr tablet Take 25 mg by mouth every evening. Take with or immediately following a meal    . metoprolol tartrate (LOPRESSOR) 25 MG tablet Take 25 mg by mouth every morning.    . nitroGLYCERIN (NITROSTAT) 0.4 MG SL tablet Place 1 tablet (0.4 mg total) under the tongue every 5 (five) minutes as needed for chest pain (MAX 3 TABLETS). 30 tablet 11  . omeprazole (PRILOSEC) 40 MG capsule Take 1 capsule by mouth daily.  5   No current facility-administered medications for this visit.    Allergies:   Review of patient's allergies indicates no known allergies.   Social History:  The patient  reports that she has never smoked. She does not have any smokeless tobacco history on file. She reports that she does not drink alcohol or use illicit drugs.   Family History:  The patient's family history includes Colon cancer in her father; Heart failure in her mother; Hyperthyroidism in her son; Hypothyroidism in her daughter. There is no history of Coronary artery disease.    ROS:  Please see the history of present illness.   All other systems are reviewed and negative.    PHYSICAL EXAM: VS:  BP 124/66 mmHg  Pulse 72  Ht 5' 5.5" (1.664 m)  Wt 149 lb 9.6 oz (67.858 kg)  BMI 24.51 kg/m2  SpO2 97% , BMI  Body mass index is 24.51 kg/(m^2). GEN: Well nourished, well developed, in no acute distress HEENT: normal Neck: no JVD, carotid bruits, or masses Cardiac: RRR; no murmurs, rubs, or gallops,no edema  Respiratory:  clear to auscultation bilaterally, normal work of breathing GI: soft, nontender, nondistended, + BS MS: no deformity or atrophy Skin: warm and dry, device pocket is well healed Neuro:  Strength and sensation are intact Psych: euthymic mood, full affect  EKG:  EKG is ordered today. The ekg ordered today shows sinus rhythm 72 bpm, short PR  Device interrogation is reviewed today in detail.  See PaceArt for details.   Recent Labs: 07/20/2015:  BUN <5*; Creatinine, Ser 0.68; Hemoglobin 13.8; Platelets 295; Potassium 3.9; Sodium 141    Lipid Panel  No results found for: CHOL, TRIG, HDL, CHOLHDL, VLDL, LDLCALC, LDLDIRECT   Wt Readings from Last 3 Encounters:  12/08/15 149 lb 9.6 oz (67.858 kg)  07/20/15 152 lb (68.947 kg)  09/04/14 155 lb 3.2 oz (70.398 kg)     ASSESSMENT AND PLAN:   1. afib Well controlled No changes She has pacs which are reasonably well controlled with metoprolol She can take metoprolol tartrate as needed Her chads2vasc score is at least 2. She has declined anticoagulation I have offered 30 monitor to further evaluate her palpitations and she declines Echo to evaluate for structural changes  2. Chest discomfort At rest Both typical and atypical features Will obtain exercise myoview to further evaluate.   Current medicines are reviewed at length with the patient today.   The patient does not have concerns regarding her medicines.  The following changes were made today:  none  Follow-up with Francis Dowse PA-C in 6 months Follow-up: return to see me in 1 year  Signed, Hillis Range, MD  12/08/2015 4:26 PM     [facility] [address] [place] [address] [phone] (office) [phone] (fax)=n

## 2015-12-08 NOTE — Patient Instructions (Signed)
Your physician recommends that you continue on your current medications as directed. Please refer to the Current Medication list given to you today.  Your physician has requested that you have an echocardiogram. Echocardiography is a painless test that uses sound waves to create images of your heart. It provides your doctor with information about the size and shape of your heart and how well your heart's chambers and valves are working. This procedure takes approximately one hour. There are no restrictions for this procedure.  Your physician has requested that you have en exercise stress myoview. For further information please visit [email or web address]. Please follow instruction sheet, as given.  Your physician wants you to follow-up in: 6 months with Francis Dowseenee Ursuy.   You will receive a reminder letter in the mail two months in advance. If you don't receive a letter, please call our office to schedule the follow-up appointment. Your physician wants you to follow-up in: 12 months with Dr. Johney FrameAllred.  You will receive a reminder letter in the mail two months in advance. If you don't receive a letter, please call our office to schedule the follow-up appointment.

## 2015-12-20 ENCOUNTER — Other Ambulatory Visit: Payer: Self-pay | Admitting: Internal Medicine

## 2015-12-22 NOTE — Telephone Encounter (Signed)
Call patient and fill accordingly.  Please remove the incorrect entry

## 2015-12-23 ENCOUNTER — Other Ambulatory Visit: Payer: Self-pay | Admitting: Internal Medicine

## 2015-12-23 MED ORDER — METOPROLOL SUCCINATE ER 25 MG PO TB24: 25.0000 mg | ORAL_TABLET | Freq: Every evening | ORAL | 3 refills | Status: DC

## 2015-12-24 ENCOUNTER — Telehealth (HOSPITAL_COMMUNITY): Payer: Self-pay | Admitting: *Deleted

## 2015-12-24 NOTE — Telephone Encounter (Signed)
Left message on voicemail per DPR in reference to upcoming appointment scheduled on  12/29/15 with detailed instructions given per Myocardial Perfusion Study Information Sheet for the test. LM to arrive 15 minutes early, and that it is imperative to arrive on time for appointment to keep from having the test rescheduled. If you need to cancel or reschedule your appointment, please call the office within 24 hours of your appointment. Failure to do so may result in a cancellation of your appointment, and a $50 no show fee. Phone number given for call back for any questions. Antionette Char, RN

## 2015-12-29 ENCOUNTER — Other Ambulatory Visit (HOSPITAL_COMMUNITY): Payer: Self-pay

## 2015-12-29 ENCOUNTER — Ambulatory Visit (HOSPITAL_COMMUNITY): Payer: Medicare Other | Attending: Internal Medicine

## 2015-12-29 ENCOUNTER — Ambulatory Visit (HOSPITAL_BASED_OUTPATIENT_CLINIC_OR_DEPARTMENT_OTHER): Payer: Medicare Other

## 2015-12-29 DIAGNOSIS — R002 Palpitations: Secondary | ICD-10-CM | POA: Insufficient documentation

## 2015-12-29 DIAGNOSIS — Z8249 Family history of ischemic heart disease and other diseases of the circulatory system: Secondary | ICD-10-CM | POA: Diagnosis not present

## 2015-12-29 DIAGNOSIS — R9439 Abnormal result of other cardiovascular function study: Secondary | ICD-10-CM | POA: Insufficient documentation

## 2015-12-29 DIAGNOSIS — I4891 Unspecified atrial fibrillation: Secondary | ICD-10-CM | POA: Insufficient documentation

## 2015-12-29 DIAGNOSIS — R0789 Other chest pain: Secondary | ICD-10-CM | POA: Insufficient documentation

## 2015-12-29 DIAGNOSIS — R0602 Shortness of breath: Secondary | ICD-10-CM | POA: Insufficient documentation

## 2015-12-29 DIAGNOSIS — R0609 Other forms of dyspnea: Secondary | ICD-10-CM | POA: Insufficient documentation

## 2015-12-29 DIAGNOSIS — E785 Hyperlipidemia, unspecified: Secondary | ICD-10-CM | POA: Diagnosis not present

## 2015-12-29 LAB — MYOCARDIAL PERFUSION IMAGING
CHL CUP MPHR: 150 {beats}/min
CHL CUP NUCLEAR SSS: 3
CHL RATE OF PERCEIVED EXERTION: 15
CSEPED: 7 min
CSEPHR: 105 %
Estimated workload: 7 METS
Exercise duration (sec): 0 s
LHR: 0.27
LV dias vol: 75 mL (ref 46–106)
LV sys vol: 27 mL
Peak HR: 157 {beats}/min
Rest HR: 66 {beats}/min
SDS: 1
SRS: 2
TID: 1.11

## 2015-12-29 MED ORDER — TECHNETIUM TC 99M TETROFOSMIN IV KIT
32.9000 | PACK | Freq: Once | INTRAVENOUS | Status: AC | PRN
  Administered 2015-12-29: 32.9 via INTRAVENOUS
  Filled 2015-12-29: qty 33

## 2015-12-29 MED ORDER — TECHNETIUM TC 99M TETROFOSMIN IV KIT
11.0000 | PACK | Freq: Once | INTRAVENOUS | Status: AC | PRN
  Administered 2015-12-29: 11 via INTRAVENOUS
  Filled 2015-12-29: qty 11

## 2015-12-30 ENCOUNTER — Telehealth: Payer: Self-pay | Admitting: Internal Medicine

## 2015-12-30 NOTE — Telephone Encounter (Addendum)
Spoke with patient and she says Dr Johney Frame prescribes both Metoprolol Succ 25 mg qhs and Metoprolol Tarr 25 mg in the am.  She had called previously to get her medications filled  She has gotten them already

## 2015-12-30 NOTE — Telephone Encounter (Signed)
Left message for patient to return call in regards to her questions about her Metoprolol

## 2015-12-30 NOTE — Telephone Encounter (Signed)
Follow-up ° ° ° ° °The pt is returning the nurses call °

## 2016-01-02 ENCOUNTER — Ambulatory Visit (INDEPENDENT_AMBULATORY_CARE_PROVIDER_SITE_OTHER): Payer: Medicare Other | Admitting: Physician Assistant

## 2016-01-02 VITALS — BP 116/64 | HR 63 | Ht 65.5 in | Wt 150.0 lb

## 2016-01-02 DIAGNOSIS — I480 Paroxysmal atrial fibrillation: Secondary | ICD-10-CM | POA: Diagnosis not present

## 2016-01-02 DIAGNOSIS — R0789 Other chest pain: Secondary | ICD-10-CM

## 2016-01-02 LAB — BASIC METABOLIC PANEL
BUN: 13 mg/dL (ref 7–25)
CO2: 26 mmol/L (ref 20–31)
Calcium: 9.5 mg/dL (ref 8.6–10.4)
Chloride: 101 mmol/L (ref 98–110)
Creat: 0.86 mg/dL (ref 0.60–0.93)
GLUCOSE: 93 mg/dL (ref 65–99)
POTASSIUM: 4.6 mmol/L (ref 3.5–5.3)
SODIUM: 137 mmol/L (ref 135–146)

## 2016-01-02 NOTE — Progress Notes (Signed)
Cardiology Office Note Date:  01/02/2016  Patient ID:  Khali, Starkovich 1945-10-24, MRN [id number] PCP:  [name], MD  Electrophysiologist: Dr. Johney Frame    Chief Complaint: f/u stress result  History of Present Illness: Jessica Fowler is a 70 y.o. female with history of PAFib, AFlutter s/p AFlutter and PVI ablation 02/20/10 with Dr. Johney Frame, HLD, comes in today to be seen for Dr. Johney Frame.  She last saw him 12/08/15, at that time with symptoms felt to have both typical and atypical features and sent her for stress testing. She returns for the results.  She reports feeling "OK", states that the strong burning chest discomfort that she had been having is improved, this is associated often at night in bed, as well as with belching and meals, can linger on a low level for hours, but since starting PPI with her PMD improving, she is planned for an EGD she thinks on the 18th.  She reports a different aching type discomfort that is more random, not necessarily positional or exertional.  She states she has 9 acres and her home that she tends to on her own, denies any particular CP with heavy activities, she will feel like her HR gets up sometimes, not as high as it did at the stress test, with this rate she felt some pressure in her chest.  She denies any dizziness, near syncope or syncope.   Past Medical History:  Diagnosis Date  . Allergic rhinitis   . Atrial fibrillation (HCC)    s/p ablation 02/21/10  . Hyperlipidemia   . Pneumonia   . S/P ablation of atrial flutter 02/21/2010    Past Surgical History:  Procedure Laterality Date  . CARDIAC CATHETERIZATION  04/18/2009   Normal coronary arteries, EF 60%, 1+ MR  . EP study  02/20/2010   CTI and PVI ablation by [facility]    Current Outpatient Prescriptions  Medication Sig Dispense Refill  . aspirin 81 MG tablet Take 81 mg by mouth once a week. Pt states she is only taking about once a week    . calcium-vitamin D (OSCAL 500/200 D-3) 500-200  MG-UNIT per tablet Take 1 tablet by mouth 3 (three) times daily.      . Cyanocobalamin (VITAMIN B-12 IJ) Inject as directed. Every 30 days     . fluticasone (FLONASE) 50 MCG/ACT nasal spray Place 2 sprays into both nostrils daily.    Marland Kitchen latanoprost (XALATAN) 0.005 % ophthalmic solution Place 1 drop into both eyes at bedtime.     Marland Kitchen levofloxacin (LEVAQUIN) 750 MG tablet Take 1 tablet (750 mg total) by mouth daily. 5 tablet 0  . metoprolol succinate (TOPROL-XL) 25 MG 24 hr tablet Take 1 tablet (25 mg total) by mouth every evening. Take with or immediately following a meal 90 tablet 3  . metoprolol tartrate (LOPRESSOR) 25 MG tablet Take 25 mg by mouth every morning.    . nitroGLYCERIN (NITROSTAT) 0.4 MG SL tablet Place 1 tablet (0.4 mg total) under the tongue every 5 (five) minutes as needed for chest pain (MAX 3 TABLETS). 30 tablet 11  . omeprazole (PRILOSEC) 40 MG capsule Take 1 capsule by mouth daily.  5   No current facility-administered medications for this visit.     Allergies:   Review of patient's allergies indicates no known allergies.   Social History:  The patient  reports that she has never smoked. She does not have any smokeless tobacco history on file. She reports that she does not  drink alcohol or use drugs.   Family History:  The patient's family history includes Colon cancer in her father; Heart failure in her mother; Hyperthyroidism in her son; Hypothyroidism in her daughter.  ROS:  Please see the history of present illness.   All other systems are reviewed and otherwise negative.   PHYSICAL EXAM:  VS:  BP 116/64   Pulse 63   Ht 5' 5.5" (1.664 m)   Wt 150 lb (68 kg)   BMI 24.58 kg/m  BMI: Body mass index is 24.58 kg/m. Well nourished, well developed, in no acute distress  HEENT: normocephalic, atraumatic  Neck: no JVD, carotid bruits or masses Cardiac:  RRR; no significant murmurs, no rubs, or gallops Lungs:  clear to auscultation bilaterally, no wheezing, rhonchi or  rales  Abd: soft, nontender MS: no deformity or atrophy Ext: no edema  Skin: warm and dry, no rash Neuro:  No gross deficits appreciated Psych: euthymic mood, full affect  EKG:  12/08/15 SR  12/29/15: Exercise stress myoview, reached target HR  Nuclear stress EF: 64%.  Blood pressure demonstrated a normal response to exercise.  Horizontal ST segment depression ST segment depression of 1 mm was noted during stress in the II, III, aVF, V5 and V6 leads, beginning at 4 minutes of stress, and returning to baseline after 5-9 minutes of recovery.  This is an intermediate risk study.  The left ventricular ejection fraction is normal (55-65%).  1. No evidence for ischemia or infarction on perfusion imaging (normal).  2. There was ST depression lasting about 7 minutes into recovery in inferior leads + leads V5/V6 accompanied by chest pain during recovery (dyspnea limited exercise).  3. Normal LV systolic function.  4. Discrepant findings.  Given angina and abnormal ETT, would rate an intermediate risk study.  However, perfusion images were normal.  Would consider further investigation, ?coronary CT angiogram  12/29/15: Echocardiogram Study Conclusions - Left ventricle: The cavity size was normal. Systolic function was   normal. The estimated ejection fraction was in the range of 55%   to 60%. Wall motion was normal; there were no regional wall   motion abnormalities. Left ventricular diastolic function   parameters were normal.  Recent Labs: 07/20/2015: Hemoglobin 13.8; Platelets 295 01/02/2016: BUN 13; Creat 0.86; Potassium 4.6; Sodium 137  No results found for requested labs within last 8760 hours.   Estimated Creatinine Clearance: 55.9 mL/min (by C-G formula based on SCr of 0.86 mg/dL).   Wt Readings from Last 3 Encounters:  01/02/16 150 lb (68 kg)  12/08/15 149 lb 9.6 oz (67.9 kg)  07/20/15 152 lb (68.9 kg)     Other studies reviewed: Additional studies/records reviewed today  include: summarized above  ASSESSMENT AND PLAN:  1. afib Well controlled No changes She has been noted by Dr. Johney Frame to have pacs which are reasonably well controlled with metoprolol She has been instructed to take metoprolol tartrate as needed Her chads2vasc score is at least 2. She has declined anticoagulation Dr. Johney Frame offered 30 monitor to further evaluate her palpitations and she declines Echo noted, EF 55-60%, LA 35mm, no significant VHD  2. Chest discomfort Both typical and atypical features Stress test without ischemia, though abnormal exercise portion  The patient prefers to pursue CTA as first option, reviewed with Dr. Eden Emms, DOD, he agrees CTA is reasonable, will haveher take her metoprolol succ the evening before as she usually does, but another dose the morning of the CTa as well rather then her lopressor.  Disposition: F/u with CTA, early next week, Dr. Eden Emms will read, this will be prior to her planned EGD.  Will see her back in 20mo, pending the CTa, if no significant CAD would proceed with GI eval and can push this out further.  Current medicines are reviewed at length with the patient today.  The patient did not have any concerns regarding medicines.  Judith Blonder, PA-C 01/02/2016 6:08 PM     [facility] [address] [place] [address] [phone] (office)  [phone] (fax)

## 2016-01-02 NOTE — Patient Instructions (Signed)
Medication Instructions:   TAKE YOUR METOPROLOL SUCCINATE IN THE PM THE NIGHT BEFORE PROCEDURE   TAKE YOUR METOPROLOL SUCCINATE IN THE AM THE MORNINGOF YOUR PROCEDURE    If you need a refill on your cardiac medications before your next appointment, please call your pharmacy.  Labwork: BEMT TODAY    Testing/Procedures:  EITHER MON TUES OR WED NEXT WEEK.Marland KitchenMarland KitchenMarland KitchenYour physician has requested that you have cardiac CT. Cardiac computed tomography (CT) is a painless test that uses an x-ray machine to take clear, detailed pictures of your heart. For further information please visit [email or web address] Please follow instruction sheet as given.  Follow-Up: WITH URSUY IN ONE MONTH   Any Other Special Instructions Will Be Listed Below (If Applicable).

## 2016-01-05 ENCOUNTER — Other Ambulatory Visit: Payer: Self-pay | Admitting: Physician Assistant

## 2016-01-05 ENCOUNTER — Ambulatory Visit (INDEPENDENT_AMBULATORY_CARE_PROVIDER_SITE_OTHER)
Admission: RE | Admit: 2016-01-05 | Discharge: 2016-01-05 | Disposition: A | Discharge status: Home / Self Care | Payer: Medicare Other | Source: Ambulatory Visit | Attending: Physician Assistant | Admitting: Physician Assistant

## 2016-01-05 ENCOUNTER — Other Ambulatory Visit: Payer: Self-pay | Admitting: *Deleted

## 2016-01-05 ENCOUNTER — Encounter: Payer: Self-pay | Admitting: Physician Assistant

## 2016-01-05 DIAGNOSIS — I2510 Atherosclerotic heart disease of native coronary artery without angina pectoris: Secondary | ICD-10-CM

## 2016-01-05 DIAGNOSIS — R0789 Other chest pain: Secondary | ICD-10-CM

## 2016-01-05 MED ORDER — IOPAMIDOL (ISOVUE-370) INJECTION 76%
80.0000 mL | Freq: Once | INTRAVENOUS | Status: AC | PRN
  Administered 2016-01-05: 80 mL via INTRAVENOUS

## 2016-01-08 ENCOUNTER — Encounter (HOSPITAL_COMMUNITY): Payer: Self-pay

## 2016-01-08 ENCOUNTER — Ambulatory Visit (HOSPITAL_COMMUNITY)
Admission: RE | Admit: 2016-01-08 | Discharge: 2016-01-08 | Disposition: A | Discharge status: Home / Self Care | Payer: Medicare Other | Source: Ambulatory Visit | Attending: Physician Assistant | Admitting: Physician Assistant

## 2016-01-08 DIAGNOSIS — R079 Chest pain, unspecified: Secondary | ICD-10-CM

## 2016-01-08 DIAGNOSIS — I2510 Atherosclerotic heart disease of native coronary artery without angina pectoris: Secondary | ICD-10-CM | POA: Insufficient documentation

## 2016-01-08 MED ORDER — NITROGLYCERIN 0.4 MG SL SUBL
SUBLINGUAL_TABLET | SUBLINGUAL | Status: AC
  Filled 2016-01-08: qty 2

## 2016-01-08 MED ORDER — IOPAMIDOL (ISOVUE-370) INJECTION 76%
INTRAVENOUS | Status: AC
  Administered 2016-01-08: 80 mL
  Filled 2016-01-08: qty 100

## 2016-01-08 MED ORDER — NITROGLYCERIN 0.4 MG SL SUBL
0.4000 mg | SUBLINGUAL_TABLET | Freq: Once | SUBLINGUAL | Status: AC
  Administered 2016-01-08: 0.4 mg via SUBLINGUAL
  Filled 2016-01-08: qty 25

## 2016-01-08 NOTE — Progress Notes (Signed)
CT scan completed. Tolerated well. D/C home walking with daughter. Awake and alert. In no distress. 

## 2016-01-09 ENCOUNTER — Telehealth: Payer: Self-pay | Admitting: *Deleted

## 2016-01-09 ENCOUNTER — Telehealth: Payer: Self-pay | Admitting: Internal Medicine

## 2016-01-09 NOTE — Telephone Encounter (Signed)
SPOKE TO PT ABOUT LAB AND CT RESULTS AND VERBALIZED UNDERSTANDING THAT IF ANY MORE  RECOMMENDATIONS NEEDED SHE WILL BE CONTACTED

## 2016-01-09 NOTE — Telephone Encounter (Signed)
-----   Message from Sheilah Pigeonenee Lynn Ursuy, [place]-C sent at 01/09/2016 11:25 AM EDT ----- Please let the patient know her CT looked very good.  No significant coronary artery disease.  Her chest discomfort does not appear to be related to her heart.    Thanks [employer]ucor Corporationenee

## 2016-01-09 NOTE — Telephone Encounter (Signed)
NEW MESSAGE   Pt calling for results of the CT CORONARY MORPH W/CTA COR W/ [facility]-CT IMAGING on 01/08/16

## 2016-02-02 NOTE — Progress Notes (Signed)
Cardiology Office Note Date:  02/03/2016  Patient ID:  Jessica Fowler, DOB 08/12/1945, MRN [id number] PCP:  Gaspar SkeetersSTAMBAUGH,MERRIS, MD  Electrophysiologist: Dr. Johney FrameAllred    Chief Complaint: f/u stress result  History of Present Illness: Jessica Fowler is a 70 y.o. female with history of PAFib, AFlutter s/p AFlutter and PVI ablation 02/20/10 with Dr. Johney FrameAllred, HLD, comes in today to be seen for Dr. Johney FrameAllred.  She last saw him 12/08/15, at that time with symptoms felt to have both typical and atypical features and underwent stress testing with abnormal exercise portion, was seen by myself 01/02/16  followed by CTa with no significant CAD  She is feeling well, infrequent palpitations, states she will have a couple days she feels "double beats" on/off then they settle away again.  She has had EGD, and they found a number esophageal and stomach polyps, 2 were removed and she is [facility] a particularly large polyp she states apparently at the junction of her stomach and small intestine.  She denies any near syncope or syncope, no SOB. Previous c/o CP are improved and not felt to be cardiac, likely GI, her exertional capacity is good.   Past Medical History:  Diagnosis Date  . Allergic rhinitis   . Atrial fibrillation (HCC)    s/p ablation 02/21/10  . Hyperlipidemia   . Pneumonia   . S/P ablation of atrial flutter 02/21/2010    Past Surgical History:  Procedure Laterality Date  . CARDIAC CATHETERIZATION  04/18/2009   Normal coronary arteries, EF 60%, 1+ MR  . EP study  02/20/2010   [facility]    Current Outpatient Prescriptions  Medication Sig Dispense Refill  . aspirin 81 MG tablet Take 81 mg by mouth once a week. Pt states she is only taking about once a week    . calcium-vitamin D (OSCAL 500/200 D-3) 500-200 MG-UNIT per tablet Take 1 tablet by mouth 3 (three) times daily.      . Cyanocobalamin (VITAMIN B-12 IJ) Inject as directed. Every 30  days     . fluticasone (FLONASE) 50 MCG/ACT nasal spray Place 2 sprays into both nostrils daily.    Marland Kitchen. latanoprost (XALATAN) 0.005 % ophthalmic solution Place 1 drop into both eyes at bedtime.     . metoprolol succinate (TOPROL-XL) 25 MG 24 hr tablet Take 1 tablet (25 mg total) by mouth every evening. Take with or immediately following a meal 90 tablet 3  . metoprolol tartrate (LOPRESSOR) 25 MG tablet Take 25 mg by mouth every morning.    . nitroGLYCERIN (NITROSTAT) 0.4 MG SL tablet Place 1 tablet (0.4 mg total) under the tongue every 5 (five) minutes as needed for chest pain (MAX 3 TABLETS). 30 tablet 11  . omeprazole (PRILOSEC) 40 MG capsule Take 1 capsule by mouth daily.  5   No current facility-administered medications for this visit.     Allergies:   Review of patient's allergies indicates no known allergies.   Social History:  The patient  reports that she has never smoked. She does not have any smokeless tobacco history on file. She reports that she does not drink alcohol or use drugs.   Family History:  The patient's family history includes Colon cancer in her father; Heart failure in her mother; Hyperthyroidism in her son; Hypothyroidism in her daughter.  ROS:  Please see the history of present illness.   All other systems are reviewed and otherwise negative.  PHYSICAL EXAM:  VS:  BP 114/68   Pulse 68   Ht 5' 5.5" (1.664 m)   Wt 151 lb (68.5 kg)   BMI 24.75 kg/m  BMI: Body mass index is 24.75 kg/m. Well nourished, well developed, in no acute distress  HEENT: normocephalic, atraumatic  Neck: no JVD, carotid bruits or masses Cardiac:  RRR; no significant murmurs, no rubs, or gallops Lungs:  clear to auscultation bilaterally, no wheezing, rhonchi or rales  Abd: soft, nontender MS: no deformity or atrophy Ext: no edema  Skin: warm and dry, no rash Neuro:  No gross deficits appreciated Psych: euthymic mood, full affect  EKG:  12/08/15 SR  01/08/16: Coronary  CTangio IMPRESSION: 1. Coronary calcium score of 6. This was 26 percentile for age and sex matched control. 2. Normal coronary origin.  Right dominance. 3. 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 4.  No incidental findings were seen.  12/29/15: Exercise stress myoview, reached target HR  Nuclear stress EF: 64%.  Blood pressure demonstrated a normal response to exercise.  Horizontal ST segment depression ST segment depression of 1 mm was noted during stress in the II, III, aVF, V5 and V6 leads, beginning at 4 minutes of stress, and returning to baseline after 5-9 minutes of recovery.  This is an intermediate risk study.  The left ventricular ejection fraction is normal (55-65%).  1. No evidence for ischemia or infarction on perfusion imaging (normal).  2. There was ST depression lasting about 7 minutes into recovery in inferior leads + leads V5/V6 accompanied by chest pain during recovery (dyspnea limited exercise).  3. Normal LV systolic function.  4. Discrepant findings.  Given angina and abnormal ETT, would rate an intermediate risk study.  However, perfusion images were normal.  Would consider further investigation, ?coronary CT angiogram  12/29/15: Echocardiogram Study Conclusions - Left ventricle: The cavity size was normal. Systolic function was   normal. The estimated ejection fraction was in the range of 55%   to 60%. Wall motion was normal; there were no regional wall   motion abnormalities. Left ventricular diastolic function   parameters were normal.  Recent Labs: 07/20/2015: Hemoglobin 13.8; Platelets 295 01/02/2016: BUN 13; Creat 0.86; Potassium 4.6; Sodium 137  No results found for requested labs within last 8760 hours.   CrCl cannot be calculated (Patient's most recent lab result is older than the maximum 21 days allowed.).   Wt Readings from Last 3 Encounters:  02/03/16 151 lb (68.5 kg)  01/02/16 150 lb (68 kg)  12/08/15 149 lb  9.6 oz (67.9 kg)     Other studies reviewed: Additional studies/records reviewed today include: summarized above  ASSESSMENT AND PLAN:  1. afib occ palpitations No changes, she will infrequently use a PRN lopressor She has been noted by Dr. Johney Frame to have pacs which are reasonably well controlled with metoprolol She has been instructed to take metoprolol tartrate as needed Her chads2vasc score is at least 2. She has declined anticoagulation,, again today Dr. Johney Frame offered 30 monitor to further evaluate her palpitations and she declines, we discussed this again today, at this time, she would like to hold off, re-enforced life style, adequate hydration, avoid triggers, stimulants Echo noted, EF 55-60%, LA 35mm, no significant VHD  2. Chest discomfort No significant/obstructive CAD by CTa Non-cardiac GI  W/u and management in process, pending further polyp removals  Disposition: f/u at her planned visit with Dr. Johney Frame, she will continue with GI and her PMD  Current medicines are reviewed at length with the patient today.  The patient did not have any concerns regarding medicines.  Judith Blonder, PA-C 02/03/2016 9:23 AM     [facility] [address] [place] [address] [phone] (office)  [phone] (fax)

## 2016-02-03 ENCOUNTER — Ambulatory Visit (INDEPENDENT_AMBULATORY_CARE_PROVIDER_SITE_OTHER): Payer: Medicare Other | Admitting: Physician Assistant

## 2016-02-03 VITALS — BP 114/68 | HR 68 | Ht 65.5 in | Wt 151.0 lb

## 2016-02-03 DIAGNOSIS — I480 Paroxysmal atrial fibrillation: Secondary | ICD-10-CM

## 2016-02-03 DIAGNOSIS — I2510 Atherosclerotic heart disease of native coronary artery without angina pectoris: Secondary | ICD-10-CM

## 2016-02-03 DIAGNOSIS — R0789 Other chest pain: Secondary | ICD-10-CM | POA: Diagnosis not present

## 2016-02-03 NOTE — Patient Instructions (Signed)
Medication Instructions:  Your physician recommends that you continue on your current medications as directed. Please refer to the Current Medication list given to you today.    If you need a refill on your cardiac medications before your next appointment, please call your pharmacy.  Labwork:  NONE ORDER TODAY    Testing/Procedures:  NONE ORDER TODAY    Follow-Up:  Your physician wants you to follow-up in:  IN  6  MONTHS WITH DR ALLRED You will receive a reminder letter in the mail two months in advance. If you don't receive a letter, please call our office to schedule the follow-up appointment.      Any Other Special Instructions Will Be Listed Below (If Applicable).                                                                                                                                                   

## 2016-03-08 ENCOUNTER — Other Ambulatory Visit: Payer: Self-pay | Admitting: Internal Medicine

## 2016-03-24 ENCOUNTER — Encounter: Payer: Self-pay | Admitting: Physician Assistant

## 2016-04-27 ENCOUNTER — Encounter: Payer: Self-pay | Admitting: Physician Assistant

## 2016-04-28 ENCOUNTER — Ambulatory Visit (INDEPENDENT_AMBULATORY_CARE_PROVIDER_SITE_OTHER): Payer: Medicare Other | Admitting: Physician Assistant

## 2016-04-28 VITALS — BP 122/64 | HR 68 | Ht 65.5 in | Wt 149.0 lb

## 2016-04-28 DIAGNOSIS — I480 Paroxysmal atrial fibrillation: Secondary | ICD-10-CM | POA: Diagnosis not present

## 2016-04-28 DIAGNOSIS — R002 Palpitations: Secondary | ICD-10-CM

## 2016-04-28 DIAGNOSIS — I2510 Atherosclerotic heart disease of native coronary artery without angina pectoris: Secondary | ICD-10-CM

## 2016-04-28 NOTE — Patient Instructions (Addendum)
Medication Instructions:   Your physician recommends that you continue on your current medications as directed. Please refer to the Current Medication list given to you today.   If you need a refill on your cardiac medications before your next appointment, please call your pharmacy.  Labwork: NONE ORDERED  TODAY    Testing/Procedures: NONE ORDERED  TODAY   Follow-Up: in 3 MONTHS WITH RENEE   OR   ALLRED   Any Other Special Instructions Will Be Listed Below (If Applicable).

## 2016-04-28 NOTE — Progress Notes (Signed)
Cardiology Office Note Date:  04/28/2016  Patient ID:  Jessica Fowler, DOB 01/14/1946, MRN [id number] PCP:  Gaspar SkeetersSTAMBAUGH,MERRIS, MD  Electrophysiologist: Dr. Johney FrameAllred    Chief Complaint: patient wanted to come in and update us on her current issues  History of Present Illness: Jessica Fowler is a 70 y.o. female with history of PAFib, AFlutter s/p AFlutter and PVI ablation 02/20/10 with Dr. Johney FrameAllred, HLD, comes in today to be seen for Dr. Johney FrameAllred.  She last saw him 12/08/15, at that time with symptoms felt to have both typical and atypical features and underwent stress testing with abnormal exercise portion, was seen by myself 01/02/16  followed by CTa with no significant CAD  All-in-all feeling "OK".  Since her last visit she had the additional GI procedure.  She reports was not one large polyp but numerous small ones and unable to do anything about them endoscopically and being told nearly a total obstruction at the juncture of her stomach and intestine.  She is scheduled for surgery Friday (04/30/16) she reports plans to remove part of her pancrease, perhaps part of her stomach and a significant portion of her small intestine.  She was told the surgery likely to take upwards of 6 hours.  Since all of this, she has been very worried, there is talk of possible cancer, and very anxious, she has been feeling increased "double beats"  And once though she was for sure in AF, stating it felt just like it did before making her feel lightheaded though she laid down, tried to relax and it resolved.    Past Medical History:  Diagnosis Date  . Allergic rhinitis   . Atrial fibrillation (HCC)    s/p ablation 02/21/10  . Hyperlipidemia   . Pneumonia   . S/P ablation of atrial flutter 02/21/2010    Past Surgical History:  Procedure Laterality Date  . CARDIAC CATHETERIZATION  04/18/2009   Normal coronary arteries, EF 60%, 1+ MR  . EP study  02/20/2010   [facility]    Current Outpatient  Prescriptions  Medication Sig Dispense Refill  . aspirin 81 MG tablet Take 81 mg by mouth once a week. Pt states she is only taking about once a week    . calcium-vitamin D (OSCAL 500/200 D-3) 500-200 MG-UNIT per tablet Take 1 tablet by mouth 3 (three) times daily.      . Cyanocobalamin (VITAMIN B-12 IJ) Inject as directed. Every 30 days     . fluticasone (FLONASE) 50 MCG/ACT nasal spray Place 2 sprays into both nostrils daily.    Marland Kitchen. latanoprost (XALATAN) 0.005 % ophthalmic solution Place 1 drop into both eyes at bedtime.     . metoprolol succinate (TOPROL-XL) 25 MG 24 hr tablet Take 1 tablet (25 mg total) by mouth every evening. Take with or immediately following a meal 90 tablet 3  . metoprolol tartrate (LOPRESSOR) 25 MG tablet Take 1 tablet (25 mg total) by mouth daily. 90 tablet 2  . nitroGLYCERIN (NITROSTAT) 0.4 MG SL tablet Place 1 tablet (0.4 mg total) under the tongue every 5 (five) minutes as needed for chest pain (MAX 3 TABLETS). 30 tablet 11  . omeprazole (PRILOSEC) 40 MG capsule Take 1 capsule by mouth daily.  5   No current facility-administered medications for this visit.     Allergies:   Patient has no known allergies.   Social History:  The patient  reports that she has never smoked. She has never used  smokeless tobacco. She reports that she does not drink alcohol or use drugs.   Family History:  The patient's family history includes Colon cancer in her father; Heart failure in her mother; Hyperthyroidism in her son; Hypothyroidism in her daughter.  ROS:  Please see the history of present illness.   All other systems are reviewed and otherwise negative.   PHYSICAL EXAM:  VS:  There were no vitals taken for this visit. BMI: There is no height or weight on file to calculate BMI. Well nourished, well developed, in no acute distress  HEENT: normocephalic, atraumatic  Neck: no JVD, carotid bruits or masses Cardiac:  RRR; no significant murmurs, no rubs, or gallops Lungs:   clear to auscultation bilaterally, no wheezing, rhonchi or rales  Abd: soft, nontender MS: no deformity or atrophy Ext:  No edema  Skin: warm and dry, no rash Neuro:  No gross deficits appreciated Psych: euthymic mood, full affect  EKG:  12/08/15 SR  01/08/16: Coronary CTangio IMPRESSION: 1. Coronary calcium score of 6. This was 26 percentile for age and sex matched control. 2. Normal coronary origin.  Right dominance. 3. 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 4.  No incidental findings were seen.  12/29/15: Exercise stress myoview, reached target HR  Nuclear stress EF: 64%.  Blood pressure demonstrated a normal response to exercise.  Horizontal ST segment depression ST segment depression of 1 mm was noted during stress in the II, III, aVF, V5 and V6 leads, beginning at 4 minutes of stress, and returning to baseline after 5-9 minutes of recovery.  This is an intermediate risk study.  The left ventricular ejection fraction is normal (55-65%).  1. No evidence for ischemia or infarction on perfusion imaging (normal).  2. There was ST depression lasting about 7 minutes into recovery in inferior leads + leads V5/V6 accompanied by chest pain during recovery (dyspnea limited exercise).  3. Normal LV systolic function.  4. Discrepant findings.  Given angina and abnormal ETT, would rate an intermediate risk study.  However, perfusion images were normal.  Would consider further investigation, ?coronary CT angiogram  12/29/15: Echocardiogram Study Conclusions - Left ventricle: The cavity size was normal. Systolic function was   normal. The estimated ejection fraction was in the range of 55%   to 60%. Wall motion was normal; there were no regional wall   motion abnormalities. Left ventricular diastolic function   parameters were normal.  No significant VHD described  Recent Labs: 07/20/2015: Hemoglobin 13.8; Platelets 295 01/02/2016: BUN 13; Creat 0.86;  Potassium 4.6; Sodium 137  No results found for requested labs within last 8760 hours.   CrCl cannot be calculated (Patient's most recent lab result is older than the maximum 21 days allowed.).   Wt Readings from Last 3 Encounters:  02/03/16 151 lb (68.5 kg)  01/02/16 150 lb (68 kg)  12/08/15 149 lb 9.6 oz (67.9 kg)     Other studies reviewed: Additional studies/records reviewed today include: summarized above  ASSESSMENT AND PLAN:  1. afib occ palpitations No changes, she will infrequently use a PRN lopressor She has been noted by Dr. Johney FrameAllred to have pacs which are reasonably well controlled with metoprolol She has been instructed to take metoprolol tartrate as needed Her chads2vasc score is at least 2. She has declined anticoagulation,, again today 30 monitor to further evaluate her palpitations has been recommended in the past and she has declined, we discussed this again at her last visit and she  would like to hold off Echo noted, EF 55-60%, LA 35mm, no significant VHD  Discussed if she is observed during her post-op course to have AF, when able would need to be restarted on a/c for CHA2DS2Vasc of 2  2. Chest discomfort Not an ongoing complaint No significant/obstructive CAD by CTa Non-cardiac GI  W/u and management in process, pending further polyp removals    Disposition: RTC in 3 months, sooner if needed, revisit monitoring.  Current medicines are reviewed at length with the patient today.  The patient did not have any concerns regarding medicines.  Judith Blonder, PA-C 04/28/2016 4:42 AM     [facility] [address] [place] [address] [phone] (office)  [phone] (fax)

## 2016-06-01 ENCOUNTER — Ambulatory Visit: Payer: Medicare Other | Admitting: Physician Assistant

## 2016-06-09 ENCOUNTER — Telehealth: Payer: Self-pay | Admitting: Internal Medicine

## 2016-06-09 NOTE — Telephone Encounter (Signed)
New message       Not sure if pt needs a callback.  She did not request one. However, appt was made for her on fri, jan 12th with Nada BoozerLaura Ingold because she said her heart was "flip flopping".  She had intestinal surgery in dec and it started shortly after discharge.

## 2016-06-11 ENCOUNTER — Ambulatory Visit: Payer: Medicare Other | Admitting: Cardiology

## 2016-06-17 ENCOUNTER — Ambulatory Visit: Payer: Medicare Other | Admitting: Internal Medicine

## 2016-06-23 ENCOUNTER — Encounter: Payer: Self-pay | Admitting: Internal Medicine

## 2016-06-23 ENCOUNTER — Ambulatory Visit (INDEPENDENT_AMBULATORY_CARE_PROVIDER_SITE_OTHER): Payer: Medicare Other | Admitting: Internal Medicine

## 2016-06-23 VITALS — BP 110/62 | HR 63 | Ht 65.0 in | Wt 136.4 lb

## 2016-06-23 DIAGNOSIS — R002 Palpitations: Secondary | ICD-10-CM

## 2016-06-23 DIAGNOSIS — R0789 Other chest pain: Secondary | ICD-10-CM | POA: Diagnosis not present

## 2016-06-23 DIAGNOSIS — I480 Paroxysmal atrial fibrillation: Secondary | ICD-10-CM

## 2016-06-23 MED ORDER — METOPROLOL SUCCINATE ER 25 MG PO TB24: 25.0000 mg | ORAL_TABLET | Freq: Every evening | ORAL | 3 refills | Status: DC

## 2016-06-23 MED ORDER — METOPROLOL TARTRATE 25 MG PO TABS: 25.0000 mg | ORAL_TABLET | Freq: Every day | ORAL | 3 refills | Status: DC

## 2016-06-23 NOTE — Progress Notes (Signed)
Electrophysiology Office Note   Date:  06/23/2016   ID:  Mart PiggsLouise W Evola, DOB 02/08/1946, MRN [id number]  PCP:  Gaspar SkeetersSTAMBAUGH,MERRIS, MD  Primary Electrophysiologist: Hillis RangeJames Ita Fritzsche, MD    Chief Complaint  Patient presents with  . Atrial Fibrillation     History of Present Illness: Jessica Fowler is a 71 y.o. female who presents today for electrophysiology follow-up.   She is recovering from Whipple which she had in December due to polyps.  She has had slow recovery and has had several abdominal drains.  She has lost weight. She did have some af with RVR perioperatively but seems to be returning to baseline.  She continues to decline anticoagulation.  Today, she denies symptoms of shortness of breath, orthopnea, PND, lower extremity edema, claudication, dizziness, presyncope, syncope, bleeding, or neurologic sequela. The patient is tolerating medications without difficulties and is otherwise without complaint today.    Past Medical History:  Diagnosis Date  . Allergic rhinitis   . Atrial fibrillation (HCC)    s/p ablation 02/21/10  . Hyperlipidemia   . Pneumonia   . S/P ablation of atrial flutter 02/21/2010   Past Surgical History:  Procedure Laterality Date  . CARDIAC CATHETERIZATION  04/18/2009   Normal coronary arteries, EF 60%, 1+ MR  . EP study  02/20/2010   [facility]     Current Outpatient Prescriptions  Medication Sig Dispense Refill  . aspirin 81 MG tablet Take 81 mg by mouth once a week. Pt states she is only taking about once a week    . calcium-vitamin D (OSCAL 500/200 D-3) 500-200 MG-UNIT per tablet Take 1 tablet by mouth 3 (three) times daily.      . Cyanocobalamin (VITAMIN B-12 IJ) Inject as directed. Every 30 days     . fluticasone (FLONASE) 50 MCG/ACT nasal spray Place 2 sprays into both nostrils daily.    Marland Kitchen. latanoprost (XALATAN) 0.005 % ophthalmic solution Place 1 drop into both eyes at bedtime.     . metoprolol succinate (TOPROL-XL) 25 MG 24  hr tablet Take 1 tablet (25 mg total) by mouth every evening. Take with or immediately following a meal 90 tablet 3  . metoprolol tartrate (LOPRESSOR) 25 MG tablet Take 1 tablet (25 mg total) by mouth daily. 90 tablet 2  . nitroGLYCERIN (NITROSTAT) 0.4 MG SL tablet Place 1 tablet (0.4 mg total) under the tongue every 5 (five) minutes as needed for chest pain (MAX 3 TABLETS). 30 tablet 11  . omeprazole (PRILOSEC) 40 MG capsule Take 1 capsule by mouth daily.  5   No current facility-administered medications for this visit.     Allergies:   Patient has no known allergies.   Social History:  The patient  reports that she has never smoked. She has never used smokeless tobacco. She reports that she does not drink alcohol or use drugs.   Family History:  The patient's family history includes Colon cancer in her father; Heart failure in her mother; Hyperthyroidism in her son; Hypothyroidism in her daughter.    ROS:  Please see the history of present illness.   All other systems are reviewed and negative.    PHYSICAL EXAM: VS:  BP 110/62   Pulse 63   Ht 5\' 5"  (1.651 m)   Wt 136 lb 6.4 oz (61.9 kg)   SpO2 98%   BMI 22.70 kg/m  , BMI Body mass index is 22.7 kg/m. GEN: Well nourished, well developed, in no acute  distress  HEENT: normal  Neck: no JVD, carotid bruits, or masses Cardiac: RRR; no murmurs, rubs, or gallops,no edema  Respiratory:  clear to auscultation bilaterally, normal work of breathing GI: soft, nontender, nondistended, + BS MS: no deformity or atrophy  Skin: warm and dry, device pocket is well healed Neuro:  Strength and sensation are intact Psych: euthymic mood, full affect  EKG:  EKG is ordered today. The ekg ordered today shows sinus rhythm 62 bpm,  PR 128 msec, otherwise normal ekg   Recent Labs: 07/20/2015: Hemoglobin 13.8; Platelets 295 01/02/2016: BUN 13; Creat 0.86; Potassium 4.6; Sodium 137    Lipid Panel  No results found for: CHOL, TRIG, HDL, CHOLHDL, VLDL,  LDLCALC, LDLDIRECT   Wt Readings from Last 3 Encounters:  06/23/16 136 lb 6.4 oz (61.9 kg)  04/28/16 149 lb (67.6 kg)  02/03/16 151 lb (68.5 kg)     ASSESSMENT AND PLAN:  1. afib Reasonably well controlled No changes She has pacs which are reasonably well controlled with metoprolol She can take metoprolol tartrate as needed Her chads2vasc score is at least 2. She has declined anticoagulation.  2. Atypical Chest discomfort Improved with GI surgery No further workup planned  Current medicines are reviewed at length with the patient today.   The patient does not have concerns regarding her medicines.  The following changes were made today:  none  Follow-up with Francis Dowse PA-C every 3-6 months I will see when needed going forward  Signed, Hillis Range, MD  06/23/2016 11:29 AM     [facility] [address] [place] [address] [phone] (office) [phone] (fax)=n

## 2016-06-23 NOTE — Patient Instructions (Signed)
Medication Instructions: Your physician recommends that you continue on your current medications as directed. Please refer to the Current Medication list given to you today.   Labwork: None Ordered  Procedures/Testing: None Ordered  Follow-Up: Your physician recommends that you schedule a follow-up appointment in: 3 Months with Merita Nortonenee Ursey, PA.   Any Additional Special Instructions Will Be Listed Below (If Applicable).     If you need a refill on your cardiac medications before your next appointment, please call your pharmacy.

## 2016-07-12 NOTE — Progress Notes (Signed)
Cardiology Office Note Date:  07/12/2016  Patient ID:  Jessica PiggsLouise W Fowler, DOB 08/31/1945, MRN [id number] PCP:  Gaspar SkeetersSTAMBAUGH,MERRIS, MD  Electrophysiologist: Dr. Johney FrameAllred    Chief Complaint:  palpitations  History of Present Illness: Jessica Fowler is a 71 y.o. female with history of PAFib, AFlutter s/p AFlutter and PVI ablation 02/20/10 with Dr. Johney FrameAllred, HLD, comes in today to be seen for Dr. Johney FrameAllred.  She last saw him 06/23/16, at that time he noted she had some rapid AF post-op (whipple procedure) but these seemed to be improving, she continues to decline a/c, plans to f/u with me and Dr. Johney FrameAllred as needed going forward.   She reports she was in the hospital weeks and required multiple drains and trouble with fluid accumulation, of late, her last CT scan (a week ago) had another fluid accumulation R side though reports surgeon told her he did not think it required another drain and are watching this.  She has noticed in the last couple weeks increasing palpitations, she has had days that all day she feels like she is having on/off skipped/extra beats, tells me she has become aware of every heart beat she has.  No CP or SOB.  She is recovering from her surgery and has become increasingly more active, no dizziness, near syncope or syncope.  Today is a better day and has not felt any.  Past Medical History:  Diagnosis Date  . Allergic rhinitis   . Atrial fibrillation (HCC)    s/p ablation 02/21/10  . Hyperlipidemia   . Pneumonia   . S/P ablation of atrial flutter 02/21/2010    Past Surgical History:  Procedure Laterality Date  . CARDIAC CATHETERIZATION  04/18/2009   Normal coronary arteries, EF 60%, 1+ MR  . EP study  02/20/2010   [facility]    Current Outpatient Prescriptions  Medication Sig Dispense Refill  . aspirin 81 MG tablet Take 81 mg by mouth once a week. Pt states she is only taking about once a week    . calcium-vitamin D (OSCAL 500/200 D-3) 500-200 MG-UNIT per  tablet Take 1 tablet by mouth 3 (three) times daily.      . Cyanocobalamin (VITAMIN B-12 IJ) Inject as directed. Every 30 days     . fluticasone (FLONASE) 50 MCG/ACT nasal spray Place 2 sprays into both nostrils daily.    Marland Kitchen. latanoprost (XALATAN) 0.005 % ophthalmic solution Place 1 drop into both eyes at bedtime.     . metoprolol succinate (TOPROL-XL) 25 MG 24 hr tablet Take 1 tablet (25 mg total) by mouth every evening. Take with or immediately following a meal 90 tablet 3  . metoprolol tartrate (LOPRESSOR) 25 MG tablet Take 1 tablet (25 mg total) by mouth daily. 90 tablet 3  . nitroGLYCERIN (NITROSTAT) 0.4 MG SL tablet Place 1 tablet (0.4 mg total) under the tongue every 5 (five) minutes as needed for chest pain (MAX 3 TABLETS). 30 tablet 11  . omeprazole (PRILOSEC) 40 MG capsule Take 1 capsule by mouth daily.  5   No current facility-administered medications for this visit.     Allergies:   Patient has no known allergies.   Social History:  The patient  reports that she has never smoked. She has never used smokeless tobacco. She reports that she does not drink alcohol or use drugs.   Family History:  The patient's family history includes Colon cancer in her father; Heart failure in her mother; Hyperthyroidism in her  son; Hypothyroidism in her daughter.  ROS:  Please see the history of present illness.   All other systems are reviewed and otherwise negative.   PHYSICAL EXAM:  VS:  There were no vitals taken for this visit. BMI: There is no height or weight on file to calculate BMI. Well nourished, well developed, in no acute distress  HEENT: normocephalic, atraumatic  Neck: no JVD, carotid bruits or masses Cardiac:  RRR; no significant murmurs, no rubs, or gallops Lungs:  CTA b/l no wheezing, rhonchi or rales  Abd: soft, nontender MS: no deformity or atrophy Ext:  no edema  Skin: warm and dry, no rash Neuro:  No gross deficits appreciated Psych: euthymic mood, full affect  EKG:   06/23/16: SR, 62bpm  01/08/16: Coronary CTangio IMPRESSION: 1. Coronary calcium score of 6. This was 26 percentile for age and sex matched control. 2. Normal coronary origin.  Right dominance. 3. 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 4.  No incidental findings were seen.  12/29/15: Exercise stress myoview, reached target HR  Nuclear stress EF: 64%.  Blood pressure demonstrated a normal response to exercise.  Horizontal ST segment depression ST segment depression of 1 mm was noted during stress in the II, III, aVF, V5 and V6 leads, beginning at 4 minutes of stress, and returning to baseline after 5-9 minutes of recovery.  This is an intermediate risk study.  The left ventricular ejection fraction is normal (55-65%).  1. No evidence for ischemia or infarction on perfusion imaging (normal).  2. There was ST depression lasting about 7 minutes into recovery in inferior leads + leads V5/V6 accompanied by chest pain during recovery (dyspnea limited exercise).  3. Normal LV systolic function.  4. Discrepant findings.  Given angina and abnormal ETT, would rate an intermediate risk study.  However, perfusion images were normal.  Would consider further investigation, ?coronary CT angiogram  12/29/15: Echocardiogram Study Conclusions - Left ventricle: The cavity size was normal. Systolic function was   normal. The estimated ejection fraction was in the range of 55%   to 60%. Wall motion was normal; there were no regional wall   motion abnormalities. Left ventricular diastolic function   parameters were normal.  No significant VHD described  Recent Labs: 07/20/2015: Hemoglobin 13.8; Platelets 295 01/02/2016: BUN 13; Creat 0.86; Potassium 4.6; Sodium 137  No results found for requested labs within last 8760 hours.   CrCl cannot be calculated (Patient's most recent lab result is older than the maximum 21 days allowed.).   Wt Readings from Last 3 Encounters:    06/23/16 136 lb 6.4 oz (61.9 kg)  04/28/16 149 lb (67.6 kg)  02/03/16 151 lb (68.5 kg)     Other studies reviewed: Additional studies/records reviewed today include: summarized above  ASSESSMENT AND PLAN:  1. afib increasing palpitations (with increasing activity post-op) No changes, she will infrequently use a PRN lopressor She has been noted by Dr. Johney Frame to have pacs which are reasonably well controlled with metoprolol She has been instructed to take metoprolol tartrate as needed, this strategy does help her but she worries about taking it to often because her BP at home runs low 100's, and HR often in the 50's Her chads2vasc score is at least 2. She has declined anticoagulation Echo noted, EF 55-60%, LA 35mm, no significant VHD  She is agreeable to wearing a 2 day holter monitor reporting daily symptoms. Pending the findings, add daytime HR, likely will up-titrate her Toprol.  2. Chest discomfort Not an ongoing complaint No significant/obstructive CAD by CTa Non-cardiac Felt likely to be GI, w/u and management noted numerous polyps, now s/p Whipple    Disposition: 4 months with me, sooner if needed pending her monitor findings.  Current medicines are reviewed at length with the patient today.  The patient did not have any concerns regarding medicines.  Judith Blonder, PA-C 07/12/2016 4:36 PM     [facility] [address] [place] [address] [phone] (office)  [phone] (fax)

## 2016-07-13 ENCOUNTER — Ambulatory Visit (INDEPENDENT_AMBULATORY_CARE_PROVIDER_SITE_OTHER): Payer: Medicare Other | Admitting: Physician Assistant

## 2016-07-13 VITALS — BP 124/62 | HR 56 | Ht 66.0 in | Wt 132.0 lb

## 2016-07-13 DIAGNOSIS — R002 Palpitations: Secondary | ICD-10-CM | POA: Diagnosis not present

## 2016-07-13 DIAGNOSIS — I480 Paroxysmal atrial fibrillation: Secondary | ICD-10-CM

## 2016-07-13 MED ORDER — METOPROLOL SUCCINATE ER 25 MG PO TB24: 25.0000 mg | ORAL_TABLET | Freq: Every evening | ORAL | 3 refills | Status: DC

## 2016-07-13 NOTE — Patient Instructions (Signed)
Medication Instructions:   NONE ORDERED  TODAY   If you need a refill on your cardiac medications before your next appointment, please call your pharmacy.  Labwork: NONE ORDERED  TODAY   Testing/Procedures: Your physician has recommended that you wear a 48 HR.. holter monitor. Holter monitors are medical devices that record the heart's electrical activity. Doctors most often use these monitors to diagnose arrhythmias. Arrhythmias are problems with the speed or rhythm of the heartbeat. The monitor is a small, portable device. You can wear one while you do your normal daily activities. This is usually used to diagnose what is causing palpitations/syncope (passing out).     Follow-Up: WITH URSUY IN 4 WEEKS    Any Other Special Instructions Will Be Listed Below (If Applicable).

## 2016-07-19 ENCOUNTER — Ambulatory Visit (INDEPENDENT_AMBULATORY_CARE_PROVIDER_SITE_OTHER): Payer: Medicare Other

## 2016-07-19 ENCOUNTER — Other Ambulatory Visit: Payer: Self-pay | Admitting: Physician Assistant

## 2016-07-19 DIAGNOSIS — I480 Paroxysmal atrial fibrillation: Secondary | ICD-10-CM

## 2016-07-19 DIAGNOSIS — R002 Palpitations: Secondary | ICD-10-CM

## 2016-07-28 ENCOUNTER — Ambulatory Visit: Payer: Medicare Other | Admitting: Internal Medicine

## 2016-08-11 ENCOUNTER — Ambulatory Visit: Payer: Medicare Other | Admitting: Physician Assistant

## 2016-08-12 ENCOUNTER — Ambulatory Visit: Payer: Medicare Other | Admitting: Physician Assistant

## 2016-09-21 ENCOUNTER — Ambulatory Visit: Payer: Medicare Other | Admitting: Physician Assistant

## 2016-12-20 ENCOUNTER — Other Ambulatory Visit: Payer: Self-pay | Admitting: Internal Medicine

## 2017-02-03 ENCOUNTER — Telehealth: Payer: Self-pay

## 2017-02-03 NOTE — Telephone Encounter (Signed)
Called to speak with patient in regards to clarifying Beta Blocker with patient. Received a duplicate therapy notice drom [facility] in regards to patients Metoprolol. It appears pt is prescribed both Metoprolol Succ and Metoprolol Tart which patient admits to taking simultaneously. Patient states she takes metoprolol Tart in the AM and Metoprolol Succ in the PM. She also admits that she will sometimes take an addition Metoprolol Tart for breakthrough if she feels like she is in a "bad" episode of Afib. She says she has been on this regiment for years and that Dr. Johney FrameAllred has given her the ok and is aware of her current medication regiment. I told her I would reiterate to Dr. Johney FrameAllred as well as to Tresa EndoKelly to make sure this is still ok for current therapy and if any changes needed to be made then we would make her aware. We are both agreeable to plan.

## 2017-02-15 ENCOUNTER — Other Ambulatory Visit: Payer: Self-pay | Admitting: Internal Medicine

## 2017-03-11 ENCOUNTER — Encounter: Payer: Self-pay | Admitting: Internal Medicine

## 2017-03-11 ENCOUNTER — Ambulatory Visit (INDEPENDENT_AMBULATORY_CARE_PROVIDER_SITE_OTHER): Payer: Medicare Other | Admitting: Internal Medicine

## 2017-03-11 ENCOUNTER — Encounter (INDEPENDENT_AMBULATORY_CARE_PROVIDER_SITE_OTHER): Payer: Self-pay

## 2017-03-11 VITALS — BP 120/72 | HR 64 | Ht 65.5 in | Wt 129.4 lb

## 2017-03-11 DIAGNOSIS — R0602 Shortness of breath: Secondary | ICD-10-CM

## 2017-03-11 DIAGNOSIS — I480 Paroxysmal atrial fibrillation: Secondary | ICD-10-CM | POA: Diagnosis not present

## 2017-03-11 NOTE — Progress Notes (Signed)
   PCP: [name], MD   Primary EP: Dr Antonieta Iba is a 71 y.o. female who presents today for routine electrophysiology followup.  Since last being seen in our clinic, the patient reports doing reasonably well.  She has not been able to gain much weight since her whipple procedure over a year ago.  She has + conjunctivitis of unknown cause for which she is following with primary care and ophtho.  She has SOB with moderate activity. Today, she denies symptoms of palpitations, chest pain,  lower extremity edema, dizziness, presyncope, or syncope.  The patient is otherwise without complaint today.   Past Medical History:  Diagnosis Date  . Allergic rhinitis   . Atrial fibrillation (HCC)    s/p ablation 02/21/10  . Hyperlipidemia   . Pneumonia   . S/P ablation of atrial flutter 02/21/2010   Past Surgical History:  Procedure Laterality Date  . CARDIAC CATHETERIZATION  04/18/2009   Normal coronary arteries, EF 60%, 1+ MR  . EP study  02/20/2010   CTI and PVI ablation by JA    ROS- all systems are reviewed and negatives except as per HPI above  Current Outpatient Prescriptions  Medication Sig Dispense Refill  . aspirin 81 MG tablet Take 81 mg by mouth once a week. Pt states she is only taking about once a week    . calcium-vitamin D (OSCAL 500/200 D-3) 500-200 MG-UNIT per tablet Take 1 tablet by mouth 3 (three) times daily.      . Cyanocobalamin (VITAMIN B-12 PO) Take 1 tablet by mouth daily.    . fluticasone (FLONASE) 50 MCG/ACT nasal spray Place 2 sprays into both nostrils daily.    Marland Kitchen latanoprost (XALATAN) 0.005 % ophthalmic solution Place 1 drop into both eyes at bedtime.     . metoprolol succinate (TOPROL-XL) 25 MG 24 hr tablet TAKE 1 TABLET BY MOUTH ONCE DAILY IN THE EVENING 90 tablet 1  . metoprolol tartrate (LOPRESSOR) 25 MG tablet Take 1 tablet (25 mg total) by mouth every morning. 90 tablet 1  . nitroGLYCERIN (NITROSTAT) 0.4 MG SL tablet Place 1 tablet (0.4 mg  total) under the tongue every 5 (five) minutes as needed for chest pain (MAX 3 TABLETS). 30 tablet 11  . omeprazole (PRILOSEC) 40 MG capsule Take 1 capsule by mouth daily.  5  . Pancrelipase, Lip-Prot-Amyl, (CREON PO) Take by mouth as directed.     No current facility-administered medications for this visit.     Physical Exam: Vitals:   03/11/17 1418  BP: 120/72  Pulse: 64  SpO2: 99%  Weight: 129 lb 6.4 oz (58.7 kg)  Height: 5' 5.5" (1.664 m)    GEN- The patient is well appearing, alert and oriented x 3 today.   Head- normocephalic, atraumatic Eyes-  + bilateral dry conjunctivitis Ears- hearing intact Oropharynx- clear Lungs- Clear to ausculation bilaterally, normal work of breathing Heart- Regular rate and rhythm, no murmurs, rubs or gallops, PMI not laterally displaced GI- soft, NT, ND, + BS Extremities- no clubbing, cyanosis, or edema  EKG tracing ordered today is personally reviewed and shows sinus rhythm 64 bpm, normal ekg Cardiac CT and echo from 2017 reviewed today  Assessment and Plan:  1. Paroxysmal atrial fibrillation Stable No change required today  2. SOB Unclear etiology Echo Cardiac CT from 2017 was low risk  Follow-up with EP NP every 6 months  Hillis Range MD, [facility] 03/11/2017 2:32 PM

## 2017-03-11 NOTE — Patient Instructions (Signed)
Medication Instructions:  Your physician recommends that you continue on your current medications as directed. Please refer to the Current Medication list given to you today.  -- If you need a refill on your cardiac medications before your next appointment, please call your pharmacy. --  Labwork: None ordered  Testing/Procedures: Your physician has requested that you have an echocardiogram. Echocardiography is a painless test that uses sound waves to create images of your heart. It provides your doctor with information about the size and shape of your heart and how well your heart's chambers and valves are working. This procedure takes approximately one hour. There are no restrictions for this procedure.    Follow-Up: Your physician wants you to follow-up in: 6 months with Francis Dowse, PA.  You will receive a reminder letter in the mail two months in advance. If you don't receive a letter, please call our office to schedule the follow-up appointment.  Thank you for choosing [facility]!!   Sigurd Sos, RN [phone]  Any Other Special Instructions Will Be Listed Below (If Applicable).

## 2017-03-16 ENCOUNTER — Other Ambulatory Visit: Payer: Self-pay

## 2017-03-16 ENCOUNTER — Ambulatory Visit (HOSPITAL_COMMUNITY): Payer: Medicare Other | Attending: Cardiovascular Disease

## 2017-03-16 DIAGNOSIS — R0602 Shortness of breath: Secondary | ICD-10-CM | POA: Diagnosis present

## 2017-03-16 DIAGNOSIS — E785 Hyperlipidemia, unspecified: Secondary | ICD-10-CM | POA: Diagnosis not present

## 2017-03-16 DIAGNOSIS — I081 Rheumatic disorders of both mitral and tricuspid valves: Secondary | ICD-10-CM | POA: Diagnosis not present

## 2017-06-01 ENCOUNTER — Telehealth: Payer: Self-pay | Admitting: Internal Medicine

## 2017-06-01 NOTE — Progress Notes (Signed)
Cardiology Office Note Date:  06/02/2017  Patient ID:  Jessica Fowler, DOB 03/29/1946, MRN [id number] PCP:  Gaspar SkeetersStambaugh, Merris, MD  Electrophysiologist: Dr. Johney FrameAllred    Chief Complaint:  Recurrent palpitations  History of Present Illness: Jessica CreteLouise W Sorg is a 72 y.o. female with history of PAFib, AFlutter s/p AFlutter and PVI ablation 02/20/10 with Dr. Johney FrameAllred, HLD, comes in today to be seen for Dr. Johney FrameAllred.  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  She was seen by myself in Feb 2018, she had noticed in the previous couple weeks increasing palpitations, she described days that all day she feels like she is having on/off skipped/extra beats, eventually felt she had become aware of every heart beat she has.  No CP or SOB.  She was recovering from her Whipple surgery and had become increasingly more active, no dizziness, near syncope or syncope.  No changes were made at that visit with plans for holter monitoring which she had (48 hours) with only rare PACs and nonsustained AT.    She most recently saw Dr. Johney FrameAllred in October, he notes that she had been struggling with inability to gain weight post Whipple surgery, otherwise without much in the way of palpitations, did get winded with exertion and an echo was ordered.   Planned for PA/NP visits Q 6 months.  Her echo noted LVEF 55-60%, normal diastolic function, no significant VHD or findings.  She comes today as an add-on visit with an increase in her palpitations over the last week.  She states though yesterday after calling and getting the appointment, that last night seemed to simmer down quite a bit.  She denies CP, has chronic problems with GERD, but denies CP.  She reports over the last year she will feel intermittent skipped/extra beats but in the last week this has ramped up.  She denies any particular  changes personally this week other then the holidays.  No new medicines.  She was to have started on a new medicine for her pancrease to take with meals/snacks but was unable to afford it and has not started it, she will f/u with her GI about this.  She reports 2 different feeling palpitations.  One is brief fats beats "my heart goes crasy" these last a few minutes and make her feel a little winded.  She has another that feels like a double heart beat the second is very strong and then stops a second.  These are particularly anxiety provoking for her, can happen aver 2-3 beats for a while then intermittently.  No dizziness, near syncope or syncope.   She tells me she is frankly aware of every heart beat she has.   Past Medical History:  Diagnosis Date  . Allergic rhinitis   . Atrial fibrillation (HCC)    s/p ablation 02/21/10  . Hyperlipidemia   . Pneumonia   . S/P ablation of atrial flutter 02/21/2010    Past Surgical History:  Procedure Laterality Date  . CARDIAC CATHETERIZATION  04/18/2009   Normal coronary arteries, EF 60%, 1+ MR  . EP study  02/20/2010   CTI and PVI ablation by JA    Current Outpatient Medications  Medication Sig Dispense Refill  . aspirin 81 MG tablet Take 81 mg by mouth once a week. Pt states she is only taking about once a week    .  brimonidine (ALPHAGAN P) 0.1 % SOLN Place 1 drop into both eyes 3 (three) times daily.    . calcium-vitamin D (OSCAL 500/200 D-3) 500-200 MG-UNIT per tablet Take 1 tablet by mouth 3 (three) times daily.      . Cyanocobalamin (VITAMIN B-12 PO) Take 1 tablet by mouth daily.    . fluticasone (FLONASE) 50 MCG/ACT nasal spray Place 2 sprays into both nostrils daily.    Marland Kitchen latanoprost (XALATAN) 0.005 % ophthalmic solution Place 1 drop into both eyes at bedtime.     . metoprolol succinate (TOPROL-XL) 25 MG 24 hr tablet TAKE 1 TABLET BY MOUTH ONCE DAILY IN THE EVENING 90 tablet 1  . metoprolol tartrate (LOPRESSOR) 25 MG tablet Take 1 tablet (25  mg total) by mouth every morning. 90 tablet 1  . nitroGLYCERIN (NITROSTAT) 0.4 MG SL tablet Place 1 tablet (0.4 mg total) under the tongue every 5 (five) minutes as needed for chest pain (MAX 3 TABLETS). 30 tablet 11  . omeprazole (PRILOSEC) 40 MG capsule Take 1 capsule by mouth daily.  5  . Pancrelipase, Lip-Prot-Amyl, (CREON PO) Take by mouth as directed.     No current facility-administered medications for this visit.     Allergies:   Pollen extract   Social History:  The patient  reports that  has never smoked. she has never used smokeless tobacco. She reports that she does not drink alcohol or use drugs.   Family History:  The patient's family history includes Colon cancer in her father; Heart failure in her mother; Hyperthyroidism in her son; Hypothyroidism in her daughter.  ROS:  Please see the history of present illness.   All other systems are reviewed and otherwise negative.   PHYSICAL EXAM:  VS:  BP 108/64   Pulse (!) 57   Ht 5' 5.5" (1.664 m)   Wt 133 lb (60.3 kg)   SpO2 98%   BMI 21.80 kg/m  BMI: Body mass index is 21.8 kg/m. Well nourished, well developed, in no acute distress  HEENT: normocephalic, atraumatic  Neck: no JVD, carotid bruits or masses Cardiac: RRR; no significant murmurs, no rubs, or gallops Lungs: CTA b/l no wheezing, rhonchi or rales  Abd: soft, nontender MS: no deformity, age appropriate atrophy Ext:  no edema  Skin: warm and dry, no rash Neuro:  No gross deficits appreciated Psych: euthymic mood, full affect  EKG:  Done today and reviewed by myself: SB 58bpm, PR , QRS 76ms, QTc , PVC  03/16/17: TTE Study Conclusions - Left ventricle: The cavity size was normal. Systolic function was   normal. The estimated ejection fraction was in the range of 55%   to 60%. Wall motion was normal; there were no regional wall   motion abnormalities. Left ventricular diastolic function   parameters were normal. - Aortic valve: Transvalvular  velocity was within the normal range.   There was no stenosis. There was no regurgitation. - Mitral valve: Mild prolapse, involving the anterior leaflet.   Transvalvular velocity was within the normal range. There was no   evidence for stenosis. There was trivial regurgitation. - Right ventricle: The cavity size was normal. Wall thickness was   normal. Systolic function was normal. - Atrial septum: No defect or patent foramen ovale was identified. - Tricuspid valve: There was mild regurgitation. - Pulmonary arteries: Systolic pressure was within the normal   range. PA peak pressure: 23 mm Hg (S). - Pericardium, extracardiac: A trivial pericardial effusion was   identified.  01/08/16:  Coronary CTangio IMPRESSION: 1. Coronary calcium score of 6. This was 26 percentile for age and sex matched control. 2. Normal coronary origin.  Right dominance. 3. 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 4.  No incidental findings were seen.  12/29/15: Exercise stress myoview, reached target HR  Nuclear stress EF: 64%.  Blood pressure demonstrated a normal response to exercise.  Horizontal ST segment depression ST segment depression of 1 mm was noted during stress in the II, III, aVF, V5 and V6 leads, beginning at 4 minutes of stress, and returning to baseline after 5-9 minutes of recovery.  This is an intermediate risk study.  The left ventricular ejection fraction is normal (55-65%).  1. No evidence for ischemia or infarction on perfusion imaging (normal).  2. There was ST depression lasting about 7 minutes into recovery in inferior leads + leads V5/V6 accompanied by chest pain during recovery (dyspnea limited exercise).  3. Normal LV systolic function.  4. Discrepant findings.  Given angina and abnormal ETT, would rate an intermediate risk study.  However, perfusion images were normal.  Would consider further investigation, ?coronary CT angiogram  12/29/15:  Echocardiogram Study Conclusions - Left ventricle: The cavity size was normal. Systolic function was   normal. The estimated ejection fraction was in the range of 55%   to 60%. Wall motion was normal; there were no regional wall   motion abnormalities. Left ventricular diastolic function   parameters were normal.  No significant VHD described  Recent Labs: No results found for requested labs within last 8760 hours.  No results found for requested labs within last 8760 hours.   CrCl cannot be calculated (Patient's most recent lab result is older than the maximum 21 days allowed.).   Wt Readings from Last 3 Encounters:  06/02/17 133 lb (60.3 kg)  03/11/17 129 lb 6.4 oz (58.7 kg)  07/13/16 132 lb (59.9 kg)     Other studies reviewed: Additional studies/records reviewed today include: summarized above  ASSESSMENT AND PLAN:  1. afib She has been noted by Dr. Johney Frame to have pacs which are reasonably well controlled with current metoprolol regime She has been instructed to take metoprolol tartrate as needed, this strategy does help her but she worries about taking it to often because her BP at home runs low 100's, and HR often in the 50's, re-discussed this today.  She reports SBP 90's as well, and this will be a limiting factor Her chads2vasc score is at least 2. She has declined anticoagulation Echo noted, EF 55-60%, LA 35mm, no significant VHD  Her palpitations are very anxiety provoking for her.  She has an acute awareness of her heart.   PVC noted today and likely at least one her described palpitations, will get BMET and mag today, she is given reassurance but remains quite worried, will go ahead and do an event monitor, no medication changes for now.  She takes an unusual BB regime with lopressor in AM and toprol in PM, this has apparently worked well for her until now and she is reluctant to change.  She reports adequate PO intake.   2. Chest discomfort Not an ongoing  complaint No significant/obstructive CAD by CTa Non-cardiac Felt likely to be GI, w/u and management noted numerous polyps, now s/p Whipple Unable to afford last prescribed regime, pending f/u with GI    Disposition: event monitor and 6 wek follow up, sooner if needed.  Current medicines are reviewed at length with the  patient today.  The patient did not have any concerns regarding medicines.  Judith Blonder, PA-C 06/02/2017 9:04 AM     [facility] [address] [place] [address] [phone] (office)  [phone] (fax)

## 2017-06-01 NOTE — Telephone Encounter (Signed)
Patient c/o Palpitations:  High priority if patient c/o lightheadedness, shortness of breath, or chest pain  1) How long have you had palpitations/irregular HR/ Afib? Are you having the symptoms now?  Week, no  2) Are you currently experiencing lightheadedness, SOB or CP? CP  3) Do you have a history of afib (atrial fibrillation) or irregular heart rhythm?  yes  4) Have you checked your BP or HR? (document readings if available):  Sometimes in the 80 and 90  5) Are you experiencing any other symptoms? A lot of double beating in her chest

## 2017-06-01 NOTE — Telephone Encounter (Signed)
Returned call to patient and she feels her heart "beating funny"  She has 2-3 beats then skips.  Says it has done this in the past and would stop but now it is continues and has been going on for about 1 week.  Francis Dowseenee Ursuy, PA will see the patient tomorrow at 9 am.  She is aware

## 2017-06-02 ENCOUNTER — Ambulatory Visit (INDEPENDENT_AMBULATORY_CARE_PROVIDER_SITE_OTHER): Payer: Medicare Other | Admitting: Physician Assistant

## 2017-06-02 ENCOUNTER — Encounter: Payer: Self-pay | Admitting: Physician Assistant

## 2017-06-02 VITALS — BP 108/64 | HR 57 | Ht 65.5 in | Wt 133.0 lb

## 2017-06-02 DIAGNOSIS — I480 Paroxysmal atrial fibrillation: Secondary | ICD-10-CM

## 2017-06-02 DIAGNOSIS — I493 Ventricular premature depolarization: Secondary | ICD-10-CM | POA: Diagnosis not present

## 2017-06-02 DIAGNOSIS — R002 Palpitations: Secondary | ICD-10-CM | POA: Diagnosis not present

## 2017-06-02 LAB — BASIC METABOLIC PANEL
BUN/Creatinine Ratio: 13 (ref 12–28)
BUN: 10 mg/dL (ref 8–27)
CO2: 23 mmol/L (ref 20–29)
Calcium: 9.4 mg/dL (ref 8.7–10.3)
Chloride: 104 mmol/L (ref 96–106)
Creatinine, Ser: 0.8 mg/dL (ref 0.57–1.00)
GFR, EST AFRICAN AMERICAN: 86 mL/min/{1.73_m2} (ref >59)
GFR, EST NON AFRICAN AMERICAN: 74 mL/min/{1.73_m2} (ref >59)
Glucose: 97 mg/dL (ref 65–99)
POTASSIUM: 4.3 mmol/L (ref 3.5–5.2)
SODIUM: 142 mmol/L (ref 134–144)

## 2017-06-02 LAB — MAGNESIUM: Magnesium: 2.1 mg/dL (ref 1.6–2.3)

## 2017-06-02 NOTE — Patient Instructions (Addendum)
Medication Instructions:   Your physician recommends that you continue on your current medications as directed. Please refer to the Current Medication list given to you today.   If you need a refill on your cardiac medications before your next appointment, please call your pharmacy.  Labwork:  BMET AND MAG    Testing/Procedures:  Your physician has recommended that you wear an event monitor. Event monitors are medical devices that record the heart's electrical activity. Doctors most often us these monitors to diagnose arrhythmias. Arrhythmias are problems with the speed or rhythm of the heartbeat. The monitor is a small, portable device. You can wear one while you do your normal daily activities. This is usually used to diagnose what is causing palpitations/syncope (passing out).  '   Follow-Up:   6 WEEKS WITH  URSUY     Any Other Special Instructions Will Be Listed Below (If Applicable).

## 2017-06-06 ENCOUNTER — Telehealth: Payer: Self-pay | Admitting: *Deleted

## 2017-06-06 NOTE — Telephone Encounter (Signed)
LMOVM OF NORMAL RESULTS AND TO CALL CLINIC IF HAVE ANY QUESTIONS OR CONCERNS

## 2017-06-06 NOTE — Telephone Encounter (Signed)
-----   Message from [facility]ene[facility] Lynn Ursuy, [place]PA-C sent at 06/03/2017 10:04 AM EST ----- Please let the patient know her labs looked good.  Thanks renee

## 2017-06-09 ENCOUNTER — Encounter: Payer: Self-pay | Admitting: *Deleted

## 2017-06-09 ENCOUNTER — Other Ambulatory Visit: Payer: Self-pay | Admitting: Physician Assistant

## 2017-06-09 ENCOUNTER — Ambulatory Visit: Payer: Medicare Other

## 2017-06-09 DIAGNOSIS — I493 Ventricular premature depolarization: Secondary | ICD-10-CM

## 2017-06-09 DIAGNOSIS — R002 Palpitations: Secondary | ICD-10-CM

## 2017-06-09 NOTE — Progress Notes (Signed)
Patient ID: Jessica Fowler, female   DOB: 01/03/1946, 72 y.o.   MRN: [id number]  Patient declined 30 day cardiac event monitor at this time.

## 2017-06-16 ENCOUNTER — Encounter: Payer: Self-pay | Admitting: Internal Medicine

## 2017-06-18 ENCOUNTER — Other Ambulatory Visit: Payer: Self-pay | Admitting: Internal Medicine

## 2017-06-20 ENCOUNTER — Other Ambulatory Visit: Payer: Self-pay | Admitting: Internal Medicine

## 2017-06-20 MED ORDER — METOPROLOL SUCCINATE ER 25 MG PO TB24: 25.0000 mg | ORAL_TABLET | Freq: Every evening | ORAL | 3 refills | Status: DC

## 2017-06-23 ENCOUNTER — Telehealth: Payer: Self-pay | Admitting: *Deleted

## 2017-06-23 NOTE — Telephone Encounter (Signed)
SPOKE PT ABOUT METOPROLOL  TARTRATE AND SUCCINATE  EVERY SINCE HAVING AFIB HAVE BEEN GOING BACK AND FORTH   ON METORPOLOL SUCCINATE AND TARTRATE WITH DR Johney FrameALLRED AND WHICH ONE WORKS THE BEST WITH AFIB.  SO DR ALLRED DECIDED PT TO TAKE TARTRATE IN AM AND SUCCINATE IN EVENING TO WORKS FOR AFIB.  PT  STATES IT HAS BEEN WORKING WELL FOR HER FOR  A FEW YEARS NOW.

## 2017-07-03 ENCOUNTER — Telehealth: Payer: Self-pay | Admitting: *Deleted

## 2017-07-03 NOTE — Telephone Encounter (Signed)
-----   Message from Gerome Sameborah D Miller sent at 06/17/2017 11:17 AM EST ----- Regarding: MONITOR Canceled: 06/02/2017 9:52 Fowler, Jessica A Cancel Rsn: Patient

## 2017-07-14 ENCOUNTER — Ambulatory Visit (INDEPENDENT_AMBULATORY_CARE_PROVIDER_SITE_OTHER): Payer: Medicare Other | Admitting: Internal Medicine

## 2017-07-14 ENCOUNTER — Encounter: Payer: Self-pay | Admitting: Internal Medicine

## 2017-07-14 ENCOUNTER — Ambulatory Visit: Payer: Medicare Other | Admitting: Internal Medicine

## 2017-07-14 VITALS — BP 114/86 | HR 60 | Ht 65.5 in | Wt 133.0 lb

## 2017-07-14 DIAGNOSIS — I480 Paroxysmal atrial fibrillation: Secondary | ICD-10-CM

## 2017-07-14 DIAGNOSIS — R002 Palpitations: Secondary | ICD-10-CM | POA: Diagnosis not present

## 2017-07-14 DIAGNOSIS — I493 Ventricular premature depolarization: Secondary | ICD-10-CM | POA: Diagnosis not present

## 2017-07-14 NOTE — H&P (View-Only) (Signed)
PCP: Gaspar SkeetersStambaugh, Merris, MD   Primary EP: Dr Antonieta IbaAllred  Chudney W Jessica Fowler is a 72 y.o. female who presents today for routine electrophysiology followup.  Since last being seen in our clinic, the patient reports doing reasonably well.   She has occasional palpitations which she thinks may be afib.  She finds this very anxiety producing.  Today, she denies symptoms of chest pain, shortness of breath,  lower extremity edema, dizziness, presyncope, or syncope.  The patient is otherwise without complaint today.   Past Medical History:  Diagnosis Date  . Allergic rhinitis   . Atrial fibrillation (HCC)    s/p ablation 02/21/10  . Hyperlipidemia   . Pneumonia   . S/P ablation of atrial flutter 02/21/2010   Past Surgical History:  Procedure Laterality Date  . CARDIAC CATHETERIZATION  04/18/2009   Normal coronary arteries, EF 60%, 1+ MR  . EP study  02/20/2010   CTI and PVI ablation by JA    ROS- all systems are reviewed and negatives except as per HPI above  Current Outpatient Medications  Medication Sig Dispense Refill  . aspirin 81 MG tablet Take 81 mg by mouth once a week. Pt states she is only taking about once a week    . brimonidine (ALPHAGAN P) 0.1 % SOLN Place 1 drop into both eyes 3 (three) times daily.    . calcium-vitamin D (OSCAL 500/200 D-3) 500-200 MG-UNIT per tablet Take 1 tablet by mouth 3 (three) times daily.      . Cyanocobalamin (VITAMIN B-12 PO) Take 1 tablet by mouth daily.    . fluticasone (FLONASE) 50 MCG/ACT nasal spray Place 2 sprays into both nostrils daily.    Marland Kitchen. latanoprost (XALATAN) 0.005 % ophthalmic solution Place 1 drop into both eyes at bedtime.     . metoprolol succinate (TOPROL-XL) 25 MG 24 hr tablet Take 1 tablet (25 mg total) by mouth every evening. 90 tablet 3  . metoprolol tartrate (LOPRESSOR) 25 MG tablet TAKE 1 TABLET BY MOUTH EVERY MORNING 90 tablet 3  . nitroGLYCERIN (NITROSTAT) 0.4 MG SL tablet Place 1 tablet (0.4 mg total) under the tongue every 5  (five) minutes as needed for chest pain (MAX 3 TABLETS). 30 tablet 11  . omeprazole (PRILOSEC) 40 MG capsule Take 1 capsule by mouth daily.  5   No current facility-administered medications for this visit.     Physical Exam: Vitals:   07/14/17 1021  BP: 114/86  Pulse: 60  SpO2: 99%  Weight: 133 lb (60.3 kg)  Height: 5' 5.5" (1.664 m)    GEN- The patient is well appearing, alert and oriented x 3 today.   Head- normocephalic, atraumatic Eyes-  Sclera clear, conjunctiva pink Ears- hearing intact Oropharynx- clear Lungs- Clear to ausculation bilaterally, normal work of breathing Heart- Regular rate and rhythm, no murmurs, rubs or gallops, PMI not laterally displaced GI- soft, NT, ND, + BS Extremities- no clubbing, cyanosis, or edema  EKG tracing 06/02/17 is reviewed and reveals sinus bradycardia with PVCs  Assessment and Plan:  1. Palpitations Unclear etiology Possibly due to PACs, PVCs She finds these quite worrisome.  She is s/p prior afib ablation.  I would advise long term monitoring to further evaluation the cause of her symptoms.  2. afib As above Not currently on anticoagulation If ILR reveals afib, would consider restarting anticoagulation She has diverticulosis and worries about long term anticoagulation but no recent bleeding issues  Return to see me 3 months post ILR implant  Hillis Range MD, [facility] 07/14/2017 10:47 AM

## 2017-07-14 NOTE — Progress Notes (Signed)
PCP: Gaspar SkeetersStambaugh, Merris, MD   Primary EP: Dr Antonieta IbaAllred  Jessica Fowler is a 72 y.o. female who presents today for routine electrophysiology followup.  Since last being seen in our clinic, the patient reports doing reasonably well.   She has occasional palpitations which she thinks may be afib.  She finds this very anxiety producing.  Today, she denies symptoms of chest pain, shortness of breath,  lower extremity edema, dizziness, presyncope, or syncope.  The patient is otherwise without complaint today.   Past Medical History:  Diagnosis Date  . Allergic rhinitis   . Atrial fibrillation (HCC)    s/p ablation 02/21/10  . Hyperlipidemia   . Pneumonia   . S/P ablation of atrial flutter 02/21/2010   Past Surgical History:  Procedure Laterality Date  . CARDIAC CATHETERIZATION  04/18/2009   Normal coronary arteries, EF 60%, 1+ MR  . EP study  02/20/2010   CTI and PVI ablation by JA    ROS- all systems are reviewed and negatives except as per HPI above  Current Outpatient Medications  Medication Sig Dispense Refill  . aspirin 81 MG tablet Take 81 mg by mouth once a week. Pt states she is only taking about once a week    . brimonidine (ALPHAGAN P) 0.1 % SOLN Place 1 drop into both eyes 3 (three) times daily.    . calcium-vitamin D (OSCAL 500/200 D-3) 500-200 MG-UNIT per tablet Take 1 tablet by mouth 3 (three) times daily.      . Cyanocobalamin (VITAMIN B-12 PO) Take 1 tablet by mouth daily.    . fluticasone (FLONASE) 50 MCG/ACT nasal spray Place 2 sprays into both nostrils daily.    Marland Kitchen. latanoprost (XALATAN) 0.005 % ophthalmic solution Place 1 drop into both eyes at bedtime.     . metoprolol succinate (TOPROL-XL) 25 MG 24 hr tablet Take 1 tablet (25 mg total) by mouth every evening. 90 tablet 3  . metoprolol tartrate (LOPRESSOR) 25 MG tablet TAKE 1 TABLET BY MOUTH EVERY MORNING 90 tablet 3  . nitroGLYCERIN (NITROSTAT) 0.4 MG SL tablet Place 1 tablet (0.4 mg total) under the tongue every 5  (five) minutes as needed for chest pain (MAX 3 TABLETS). 30 tablet 11  . omeprazole (PRILOSEC) 40 MG capsule Take 1 capsule by mouth daily.  5   No current facility-administered medications for this visit.     Physical Exam: Vitals:   07/14/17 1021  BP: 114/86  Pulse: 60  SpO2: 99%  Weight: 133 lb (60.3 kg)  Height: 5' 5.5" (1.664 m)    GEN- The patient is well appearing, alert and oriented x 3 today.   Head- normocephalic, atraumatic Eyes-  Sclera clear, conjunctiva pink Ears- hearing intact Oropharynx- clear Lungs- Clear to ausculation bilaterally, normal work of breathing Heart- Regular rate and rhythm, no murmurs, rubs or gallops, PMI not laterally displaced GI- soft, NT, ND, + BS Extremities- no clubbing, cyanosis, or edema  EKG tracing 06/02/17 is reviewed and reveals sinus bradycardia with PVCs  Assessment and Plan:  1. Palpitations Unclear etiology Possibly due to PACs, PVCs She finds these quite worrisome.  She is s/p prior afib ablation.  I would advise long term monitoring to further evaluation the cause of her symptoms.  2. afib As above Not currently on anticoagulation If ILR reveals afib, would consider restarting anticoagulation She has diverticulosis and worries about long term anticoagulation but no recent bleeding issues  Return to see me 3 months post ILR implant  Hillis Range MD, [facility] 07/14/2017 10:47 AM

## 2017-07-14 NOTE — Patient Instructions (Addendum)
Medication Instructions:  Your physician recommends that you continue on your current medications as directed. Please refer to the Current Medication list given to you today.  Labwork: None ordered.  Testing/Procedures: Your physician has advised you have a loop recorder implanted. You are scheduled for July 18, 2017.   You will arrive at the main entrance of [place] at 6:30 am and proceed to Admitting. No special instructions-you may eat, take am medications and drive yourself.  Follow-Up:  You will follow up with the device clinic in 10-14 days after your procedure for a wound check.  Your physician wants you to follow-up in: 3 months with Dr. Johney Frame.     Any Other Special Instructions Will Be Listed Below (If Applicable).  If you need a refill on your cardiac medications before your next appointment, please call your pharmacy.   Implantable Loop Recorder Placement An implantable loop recorder is a small electronic device that is placed under the skin of your chest. It is about the size of an AA ("double A") battery. The device records the electrical activity of your heart over a long period of time. Your health care provider can download these recordings to monitor your heart. You may need an implantable loop recorder if you have periods of abnormal heart activity (arrhythmias) or unexplained fainting (syncope) caused by a heart problem. Tell a health care provider about:  Any allergies you have.  All medicines you are taking, including vitamins, herbs, eye drops, creams, and over-the-counter medicines.  Any problems you or family members have had with anesthetic medicines.  Any blood disorders you have.  Any surgeries you have had.  Any medical conditions you have.  Whether you are pregnant or may be pregnant. What are the risks? Generally, this is a safe procedure. However, as with any procedure, problems may occur,  including:  Infection.  Bleeding.  Allergic reactions to anesthetic medicines.  Damage to nerves or blood vessels.  Failure of the device to work. This could require another surgery to replace it.  What happens before the procedure?   You may have a physical exam, blood tests, and imaging tests of your heart, such as a chest X-ray.  Follow instructions from your health care provider about eating or drinking restrictions.  Ask your health care provider about: ? Changing or stopping your regular medicines. This is especially important if you are taking diabetes medicines or blood thinners. ? Taking medicines such as aspirin and ibuprofen. These medicines can thin your blood. Do not take these medicines before your procedure if your surgeon instructs you not to.  Ask your health care provider how your surgical site will be marked or identified.  You may be given antibiotic medicine to help prevent infection.  Plan to have someone take you home after the procedure.  If you will be going home right after the procedure, plan to have someone with you for 24 hours.  Do not use any tobacco products, such as cigarettes, chewing tobacco, and e-cigarettes as told by your surgeon. If you need help quitting, ask your health care provider. What happens during the procedure?  To reduce your risk of infection: ? Your health care team will wash or sanitize their hands. ? Your skin will be washed with soap.  An IV tube will be inserted into one of your veins.  You may be given an antibiotic medicine through the IV tube.  You may be given one or more of the following: ? A medicine  to help you relax (sedative). ? A medicine to numb the area (local anesthetic).  A small cut (incision) will be made on the left side of your upper chest.  A pocket will be created under your skin.  The device will be placed in the pocket.  The incision will be closed with stitches (sutures) or adhesive  strips.  A bandage (dressing) will be placed over the incision. The procedure may vary among health care providers and hospitals. What happens after the procedure?  Your blood pressure, heart rate, breathing rate, and blood oxygen level will be monitored often until the medicines you were given have worn off.  You may be able to go home on the day of your surgery. Before going home: ? Your health care provider will program your recorder. ? You will learn how to trigger your device with a handheld activator. ? You will learn how to send recordings to your health care provider. ? You will get an ID card for your device, and you will be told when to use it.  Do not drive for 24 hours if you received a sedative. This information is not intended to replace advice given to you by your health care provider. Make sure you discuss any questions you have with your health care provider. Document Released: 04/28/2015 Document Revised: 10/23/2015 Document Reviewed: 02/19/2015 Elsevier Interactive Patient Education  [employer]2018 [employer]

## 2017-07-15 ENCOUNTER — Telehealth: Payer: Self-pay | Admitting: Internal Medicine

## 2017-07-15 NOTE — Telephone Encounter (Signed)
New message    Patient calling with questions about scheduled procedure for  Monday. Please call

## 2017-07-15 NOTE — Telephone Encounter (Signed)
Patient called with questions about the ILR. Patient wanted to know if the ILR would be recording constantly no matter where she was located. Patient advised that the ILR would be constantly recording.

## 2017-07-18 ENCOUNTER — Ambulatory Visit (HOSPITAL_COMMUNITY)
Admission: RE | Admit: 2017-07-18 | Discharge: 2017-07-18 | Disposition: A | Discharge status: Home / Self Care | Payer: Medicare Other | Source: Ambulatory Visit | Attending: Internal Medicine | Admitting: Internal Medicine

## 2017-07-18 ENCOUNTER — Encounter (HOSPITAL_COMMUNITY): Payer: Self-pay | Admitting: Internal Medicine

## 2017-07-18 ENCOUNTER — Encounter (HOSPITAL_COMMUNITY)
Admission: RE | Disposition: A | Discharge status: Home / Self Care | Payer: Self-pay | Source: Ambulatory Visit | Attending: Internal Medicine

## 2017-07-18 DIAGNOSIS — R002 Palpitations: Secondary | ICD-10-CM | POA: Diagnosis present

## 2017-07-18 DIAGNOSIS — Z7951 Long term (current) use of inhaled steroids: Secondary | ICD-10-CM | POA: Diagnosis not present

## 2017-07-18 DIAGNOSIS — Z7982 Long term (current) use of aspirin: Secondary | ICD-10-CM | POA: Insufficient documentation

## 2017-07-18 DIAGNOSIS — Z79899 Other long term (current) drug therapy: Secondary | ICD-10-CM | POA: Insufficient documentation

## 2017-07-18 DIAGNOSIS — I4891 Unspecified atrial fibrillation: Secondary | ICD-10-CM | POA: Diagnosis not present

## 2017-07-18 HISTORY — PX: LOOP RECORDER INSERTION: EP1214

## 2017-07-18 SURGERY — LOOP RECORDER INSERTION

## 2017-07-18 MED ORDER — LIDOCAINE-EPINEPHRINE 1 %-1:100000 IJ SOLN
INTRAMUSCULAR | Status: AC
  Filled 2017-07-18: qty 1

## 2017-07-18 MED ORDER — LIDOCAINE-EPINEPHRINE 1 %-1:100000 IJ SOLN
INTRAMUSCULAR | Status: DC | PRN
  Administered 2017-07-18: 20 mL

## 2017-07-18 SURGICAL SUPPLY — 2 items
LOOP REVEAL LINQSYS (Prosthesis & Implant Heart) ×3 IMPLANT
PACK LOOP INSERTION (CUSTOM PROCEDURE TRAY) ×3 IMPLANT

## 2017-07-18 NOTE — Interval H&P Note (Signed)
History and Physical Interval Note:  07/18/2017 7:33 AM  Jessica Fowler  has presented today for surgery, with the diagnosis of pvc  The various methods of treatment have been discussed with the patient and family. After consideration of risks, benefits and other options for treatment, the patient has consented to  Procedure(s): LOOP RECORDER INSERTION (N/A) as a surgical intervention .  The patient's history has been reviewed, patient examined, no change in status, stable for surgery.  I have reviewed the patient's chart and labs.  Questions were answered to the patient's satisfaction.     [name]

## 2017-07-18 NOTE — Discharge Instructions (Signed)
Verbal and written instructions given to pt and son by Dr Johney FrameAllred and reinforced by RN.  Both verbalize understanding and deny further questions

## 2017-07-25 ENCOUNTER — Other Ambulatory Visit: Payer: Self-pay | Admitting: Internal Medicine

## 2017-08-01 ENCOUNTER — Ambulatory Visit (INDEPENDENT_AMBULATORY_CARE_PROVIDER_SITE_OTHER): Payer: Self-pay | Admitting: *Deleted

## 2017-08-01 DIAGNOSIS — R002 Palpitations: Secondary | ICD-10-CM

## 2017-08-01 LAB — CUP PACEART INCLINIC DEVICE CHECK
Date Time Interrogation Session: 20190304163756
MDC IDC PG IMPLANT DT: 20190218

## 2017-08-01 NOTE — Progress Notes (Signed)
Wound check appointment. Steri-strips removed. Wound without redness or edema. Incision edges approximated, wound well healed. Normal device function. Battery status: good. R-waves 0.4631mV. No symptom or tachy episodes. Pause and brady detection remain off since implant. 1 AF episodes (<0.1% burden), 20min duration, no changes at this time per JA. Patient educated about wound care, symptom activator, and Carelink monitor. Monthly summary reports and ROV with JA on 10/19/17.

## 2017-08-03 ENCOUNTER — Telehealth: Payer: Self-pay | Admitting: Cardiology

## 2017-08-03 NOTE — Telephone Encounter (Signed)
LMOVM requesting that pt send manual transmission b/c home monitor has not updated in at least 14 days.    

## 2017-08-04 ENCOUNTER — Telehealth: Payer: Self-pay | Admitting: *Deleted

## 2017-08-04 NOTE — Telephone Encounter (Addendum)
Attempted to contact patient regarding sending manual transmission with home Carelink monitor to update Carelink network with AF episode [facility]. No answer, no voicemail.

## 2017-08-11 NOTE — Telephone Encounter (Signed)
LMOM (DPR) requesting manual transmission from Carelink monitor.  Gave instructions and Device Clinic phone number for questions/concerns.

## 2017-08-12 NOTE — Telephone Encounter (Signed)
Manual transmission received on 08/12/17.  Carelink successfully updated.

## 2017-08-22 ENCOUNTER — Ambulatory Visit (INDEPENDENT_AMBULATORY_CARE_PROVIDER_SITE_OTHER): Payer: Medicare Other | Admitting: *Deleted

## 2017-08-22 DIAGNOSIS — I480 Paroxysmal atrial fibrillation: Secondary | ICD-10-CM | POA: Diagnosis not present

## 2017-08-22 NOTE — Progress Notes (Signed)
Carelink Summary Report / Loop Recorder 

## 2017-09-23 ENCOUNTER — Ambulatory Visit (INDEPENDENT_AMBULATORY_CARE_PROVIDER_SITE_OTHER): Payer: Medicare Other | Admitting: *Deleted

## 2017-09-23 DIAGNOSIS — I480 Paroxysmal atrial fibrillation: Secondary | ICD-10-CM

## 2017-09-23 NOTE — Progress Notes (Signed)
Carelink Summary Report / Loop Recorder 

## 2017-09-28 LAB — CUP PACEART REMOTE DEVICE CHECK
Implantable Pulse Generator Implant Date: 20190218
MDC IDC SESS DTM: 20190324104318

## 2017-10-06 ENCOUNTER — Other Ambulatory Visit: Payer: Self-pay | Admitting: Internal Medicine

## 2017-10-18 LAB — CUP PACEART REMOTE DEVICE CHECK
Implantable Pulse Generator Implant Date: 20190218
MDC IDC SESS DTM: 20190426103606

## 2017-10-19 ENCOUNTER — Encounter: Payer: Medicare Other | Admitting: Internal Medicine

## 2017-10-26 ENCOUNTER — Ambulatory Visit (INDEPENDENT_AMBULATORY_CARE_PROVIDER_SITE_OTHER): Payer: Medicare Other | Admitting: *Deleted

## 2017-10-26 DIAGNOSIS — I4891 Unspecified atrial fibrillation: Secondary | ICD-10-CM | POA: Diagnosis not present

## 2017-10-26 NOTE — Progress Notes (Signed)
Carelink Summary Report / Loop Recorder 

## 2017-11-03 ENCOUNTER — Encounter: Payer: Medicare Other | Admitting: Physician Assistant

## 2017-11-21 LAB — CUP PACEART REMOTE DEVICE CHECK
Date Time Interrogation Session: 20190529103543
MDC IDC PG IMPLANT DT: 20190218

## 2017-11-28 ENCOUNTER — Ambulatory Visit (INDEPENDENT_AMBULATORY_CARE_PROVIDER_SITE_OTHER): Payer: Medicare Other | Admitting: *Deleted

## 2017-11-28 DIAGNOSIS — I4891 Unspecified atrial fibrillation: Secondary | ICD-10-CM | POA: Diagnosis not present

## 2017-11-28 NOTE — Progress Notes (Signed)
Carelink Summary Report / Loop Recorder 

## 2017-12-05 ENCOUNTER — Encounter: Payer: Medicare Other | Admitting: Physician Assistant

## 2017-12-08 NOTE — Progress Notes (Signed)
Cardiology Office Note Date:  12/09/2017  Patient ID:  Jessica Fowler, Jessica Fowler 1945-09-22, MRN [id number] PCP:  [name], MD  Electrophysiologist: Dr. Johney Frame    Chief Complaint:  Recurrent palpitations  History of Present Illness: Jessica Fowler is a 72 y.o. female with history of PAFib, AFlutter s/p AFlutter and PVI ablation 02/20/10 with Dr. Johney Frame, HLD, comes in today to be seen for Dr. Johney Frame.  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  She was seen by myself in Feb 2018, she had noticed in the previous couple weeks increasing palpitations, she described days that all day she feels like she is having on/off skipped/extra beats, eventually felt she had become aware of every heart beat she has.  No CP or SOB.  She was recovering from her Whipple surgery and had become increasingly more active, no dizziness, near syncope or syncope.  No changes were made at that visit with plans for holter monitoring which she had (48 hours) with only rare PACs and nonsustained AT.    She saw Dr. Johney Frame in October, he notes that she had been struggling with inability to gain weight post Whipple surgery, otherwise without much in the way of palpitations, did get winded with exertion and an echo was ordered.   Planned for PA/NP visits Q 6 months.  Her echo noted LVEF 55-60%, normal diastolic function, no significant VHD or findings.  I saw her in [place] as an add-on visit with an increase in her palpitations over the last week.  She stated though after calling and getting the appointment, thatthey seemed to simmer down quite a bit.  She denied CP, reported chronic problems with GERD, but no CP.  She explained over the last year she will feel intermittent skipped/extra beats but in the last week this has ramped up.  She denied any particular changes personally this week  other then the holidays.  No new medicines.  She was to have started on a new medicine for her pancrease to take with meals/snacks but was unable to afford it and had not started it, she planned to f/u with her GI about this.  She reportd 2 different feeling palpitations.  One is brief fast beats "my heart goes crazy" these last a few minutes and make her feel a little winded.  She has another that feels like a double heart beat the second is very strong and then stops a second.  These are particularly anxiety provoking for her, can happen every 2-3 beats for a while then intermittently.  No dizziness, near syncope or syncope.   She stated me she is frankly aware of every heart beat she has.  We planned for f/u monitoring (not done).  She saw Dr. Johney Frame in f/u and underwent ILR implant to better evaluate her symptoms, and monitor for recurrent AFib and potential need to reconsider a/c if recurrent AF found.  At her wound check visit, 20 minute duration of AF noted, by RN note, no change recommended by Dr. Johney Frame given minimal amount to date.  Subsequent transmission have noted short AF episode. Planned to monitor without changes unless duration exceed .  From an palpitations standpoint she is doing much better actually, she doesn't feel them like she had been earlier this year.  She has no CP, no near syncope or syncope.  She will occasionally  feel the need to take in a deep breath like she needs more air.  No SOB otherwise, does her work outside as usual.  She mentions she does nt feel like she has much energy and is having a hard time keeping weight on.  She continues to f/u with GI, was never able to get started on the regime of meds for her pancreas 2/2 cost restrictions.  Device information: MDT ILR implanted 07/18/17, to monitor post PVI ablation/palpitations   Past Medical History:  Diagnosis Date  . Allergic rhinitis   . Atrial fibrillation (HCC)    s/p ablation 02/21/10  .  Hyperlipidemia   . Pneumonia   . S/P ablation of atrial flutter 02/21/2010    Past Surgical History:  Procedure Laterality Date  . CARDIAC CATHETERIZATION  04/18/2009   Normal coronary arteries, EF 60%, 1+ MR  . EP study  02/20/2010   CTI and PVI ablation by JA  . LOOP RECORDER INSERTION N/A 07/18/2017   Procedure: LOOP RECORDER INSERTION;  Surgeon: Hillis RangeAllred, James, MD;  Location: [facility];  Service: Cardiovascular;  Laterality: N/A;    Current Outpatient Medications  Medication Sig Dispense Refill  . calcium-vitamin D (OSCAL 500/200 D-3) 500-200 MG-UNIT per tablet Take 1 tablet by mouth 3 (three) times a week.     . Cyanocobalamin (VITAMIN B-12 PO) Take 1 tablet by mouth daily.    . dorzolamide-timolol (COSOPT) 22.3-6.8 MG/ML ophthalmic solution Place 1 drop into both eyes 2 (two) times daily.    . fluticasone (FLONASE) 50 MCG/ACT nasal spray Place 2 sprays into both nostrils daily as needed for allergies.     Marland Kitchen. latanoprost (XALATAN) 0.005 % ophthalmic solution Place 1 drop into both eyes at bedtime.     . metoprolol succinate (TOPROL-XL) 25 MG 24 hr tablet Take 1 tablet (25 mg total) by mouth every evening. 90 tablet 3  . metoprolol tartrate (LOPRESSOR) 25 MG tablet TAKE 1 TABLET BY MOUTH EVERY MORNING (Patient taking differently: TAKE 25 MG BY MOUTH EVERY MORNING) 90 tablet 3  . nitroGLYCERIN (NITROSTAT) 0.4 MG SL tablet Place 1 tablet (0.4 mg total) under the tongue every 5 (five) minutes as needed for chest pain (MAX 3 TABLETS). 30 tablet 11  . omeprazole (PRILOSEC) 40 MG capsule Take 40 mg by mouth daily as needed (for acid reflux).   5   No current facility-administered medications for this visit.     Allergies:   Pollen extract and Brimonidine tartrate   Social History:  The patient  reports that she has never smoked. She has never used smokeless tobacco. She reports that she does not drink alcohol or use drugs.   Family History:  The patient's family history includes  Colon cancer in her father; Heart failure in her mother; Hyperthyroidism in her son; Hypothyroidism in her daughter.  ROS:  Please see the history of present illness.   All other systems are reviewed and otherwise negative.   PHYSICAL EXAM:  VS:  BP 108/66   Pulse (!) 54   Ht 5' 5.5" (1.664 m)   Wt 124 lb (56.2 kg)   BMI 20.32 kg/m  BMI: Body mass index is 20.32 kg/m. Well nourished, well developed, in no acute distress  HEENT: normocephalic, atraumatic  Neck: no JVD, carotid bruits or masses Cardiac: RRR; no significant murmurs, no rubs, or gallops Lungs: CTA b/l no wheezing, rhonchi or rales  Abd: soft, nontender MS: no deformity, age appropriate atrophy Ext:  no edema  Skin:  warm and dry, no rash Neuro:  No gross deficits appreciated Psych: euthymic mood, full affect  EKG:  Not done today ILR interrogation done today and reviewed by myself: SR today, R waves 0.42V.  She has had some AFib episodes, longest 6 minutes, with burden of <0.1%.  Symptom episodes are grouped PACs, and very brief AF.  03/16/17: TTE Study Conclusions - Left ventricle: The cavity size was normal. Systolic function was   normal. The estimated ejection fraction was in the range of 55%   to 60%. Wall motion was normal; there were no regional wall   motion abnormalities. Left ventricular diastolic function   parameters were normal. - Aortic valve: Transvalvular velocity was within the normal range.   There was no stenosis. There was no regurgitation. - Mitral valve: Mild prolapse, involving the anterior leaflet.   Transvalvular velocity was within the normal range. There was no   evidence for stenosis. There was trivial regurgitation. - Right ventricle: The cavity size was normal. Wall thickness was   normal. Systolic function was normal. - Atrial septum: No defect or patent foramen ovale was identified. - Tricuspid valve: There was mild regurgitation. - Pulmonary arteries: Systolic pressure was  within the normal   range. PA peak pressure: 23 mm Hg (S). - Pericardium, extracardiac: A trivial pericardial effusion was   identified.  01/08/16: Coronary CTangio IMPRESSION: 1. Coronary calcium score of 6. This was 26 percentile for age and sex matched control. 2. Normal coronary origin.  Right dominance. 3. 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 4.  No incidental findings were seen.  12/29/15: Exercise stress myoview, reached target HR  Nuclear stress EF: 64%.  Blood pressure demonstrated a normal response to exercise.  Horizontal ST segment depression ST segment depression of 1 mm was noted during stress in the II, III, aVF, V5 and V6 leads, beginning at 4 minutes of stress, and returning to baseline after 5-9 minutes of recovery.  This is an intermediate risk study.  The left ventricular ejection fraction is normal (55-65%).  1. No evidence for ischemia or infarction on perfusion imaging (normal).  2. There was ST depression lasting about 7 minutes into recovery in inferior leads + leads V5/V6 accompanied by chest pain during recovery (dyspnea limited exercise).  3. Normal LV systolic function.  4. Discrepant findings.  Given angina and abnormal ETT, would rate an intermediate risk study.  However, perfusion images were normal.  Would consider further investigation, ?coronary CT angiogram  12/29/15: Echocardiogram Study Conclusions - Left ventricle: The cavity size was normal. Systolic function was   normal. The estimated ejection fraction was in the range of 55%   to 60%. Wall motion was normal; there were no regional wall   motion abnormalities. Left ventricular diastolic function   parameters were normal.  No significant VHD described  Recent Labs: 06/02/2017: BUN 10; Creatinine, Ser 0.80; Magnesium 2.1; Potassium 4.3; Sodium 142  No results found for requested labs within last 8760 hours.   CrCl cannot be calculated (Patient's most  recent lab result is older than the maximum 21 days allowed.).   Wt Readings from Last 3 Encounters:  12/09/17 124 lb (56.2 kg)  07/18/17 132 lb (59.9 kg)  07/14/17 133 lb (60.3 kg)     Other studies reviewed: Additional studies/records reviewed today include: summarized above  ASSESSMENT AND PLAN:  1. Paroxysmal afib     H/o PVI ablation with Dr. Johney Frame in 01/2010  CHA2DS2Vasc is 2, off a/c post ablation     She has some AFib on her ILR, though very brief, 2-6 minutes, nothing too fast or sustained tachy rates  Very low burden, I discussed today consideration of resuming her a/c.  For now, given she is so in-tune with her heart, will hold off, should she start to have prolonged episodes of palpitations she will make [place] aware    2. Chest discomfort      Not an ongoing complaint      No significant/obstructive CAD by CTa      Felt likely to have been GI, w/u and management noted numerous polyps, now s/p Whipple      Unable to afford last prescribed regime      Follows with GI    Disposition: will have her back in 3-4 mo, sooner if needed  Current medicines are reviewed at length with the patient today.  The patient did not have any concerns regarding medicines.  Judith Blonder, PA-C 12/09/2017 11:13 AM     [facility] [address] [place] [address] [phone] (office)  [phone] (fax)

## 2017-12-09 ENCOUNTER — Ambulatory Visit (INDEPENDENT_AMBULATORY_CARE_PROVIDER_SITE_OTHER): Payer: Medicare Other | Admitting: Physician Assistant

## 2017-12-09 VITALS — BP 108/66 | HR 54 | Ht 65.5 in | Wt 124.0 lb

## 2017-12-09 DIAGNOSIS — I480 Paroxysmal atrial fibrillation: Secondary | ICD-10-CM

## 2017-12-09 NOTE — Patient Instructions (Signed)
Medication Instructions:   Your physician recommends that you continue on your current medications as directed. Please refer to the Current Medication list given to you today.   If you need a refill on your cardiac medications before your next appointment, please call your pharmacy.  Labwork: NONE ORDERED  TODAY    Testing/Procedures: NONE ORDERED  TODAY    Follow-Up: IN 3 TO 4 MONTHS WITH RENEE    Any Other Special Instructions Will Be Listed Below (If Applicable).

## 2017-12-27 LAB — CUP PACEART REMOTE DEVICE CHECK
Date Time Interrogation Session: 20190701114053
MDC IDC PG IMPLANT DT: 20190218

## 2018-01-02 ENCOUNTER — Ambulatory Visit (INDEPENDENT_AMBULATORY_CARE_PROVIDER_SITE_OTHER): Payer: Medicare Other | Admitting: *Deleted

## 2018-01-02 DIAGNOSIS — I4891 Unspecified atrial fibrillation: Secondary | ICD-10-CM | POA: Diagnosis not present

## 2018-01-03 NOTE — Progress Notes (Signed)
Carelink Summary Report / Loop Recorder 

## 2018-01-24 ENCOUNTER — Other Ambulatory Visit: Payer: Self-pay | Admitting: Internal Medicine

## 2018-01-25 MED ORDER — METOPROLOL TARTRATE 25 MG PO TABS: ORAL_TABLET | ORAL | 3 refills | Status: DC

## 2018-02-02 ENCOUNTER — Ambulatory Visit (INDEPENDENT_AMBULATORY_CARE_PROVIDER_SITE_OTHER): Payer: Medicare Other | Admitting: *Deleted

## 2018-02-02 DIAGNOSIS — I4891 Unspecified atrial fibrillation: Secondary | ICD-10-CM

## 2018-02-02 NOTE — Progress Notes (Signed)
Carelink Summary Report / Loop Recorder 

## 2018-02-13 LAB — CUP PACEART REMOTE DEVICE CHECK
Date Time Interrogation Session: 20190803114016
MDC IDC PG IMPLANT DT: 20190218

## 2018-02-24 LAB — CUP PACEART REMOTE DEVICE CHECK
Implantable Pulse Generator Implant Date: 20190218
MDC IDC SESS DTM: 20190905121136

## 2018-03-07 ENCOUNTER — Ambulatory Visit (INDEPENDENT_AMBULATORY_CARE_PROVIDER_SITE_OTHER): Payer: Medicare Other | Admitting: *Deleted

## 2018-03-07 DIAGNOSIS — I4891 Unspecified atrial fibrillation: Secondary | ICD-10-CM

## 2018-03-07 DIAGNOSIS — R002 Palpitations: Secondary | ICD-10-CM

## 2018-03-08 NOTE — Progress Notes (Signed)
Carelink Summary Report / Loop Recorder 

## 2018-03-20 LAB — CUP PACEART REMOTE DEVICE CHECK
Implantable Pulse Generator Implant Date: 20190218
MDC IDC SESS DTM: 20191008124110

## 2018-04-05 ENCOUNTER — Telehealth: Payer: Self-pay | Admitting: *Deleted

## 2018-04-05 NOTE — Telephone Encounter (Signed)
Still awaiting manual Carelink transmission for review of any episodes that did not transmit automatically. Reviewed available AF episode from 04/04/18 at 1929, duration 1hr , with Dr. Johney Frame. Per Dr. Johney Frame, no changes at this time, continue to monitor.

## 2018-04-05 NOTE — Telephone Encounter (Signed)
LMOM requesting manual Carelink transmission for review. Gave DC phone number for questions/concerns. 

## 2018-04-10 ENCOUNTER — Ambulatory Visit (INDEPENDENT_AMBULATORY_CARE_PROVIDER_SITE_OTHER): Payer: Medicare Other | Admitting: *Deleted

## 2018-04-10 DIAGNOSIS — I4891 Unspecified atrial fibrillation: Secondary | ICD-10-CM | POA: Diagnosis not present

## 2018-04-10 DIAGNOSIS — R002 Palpitations: Secondary | ICD-10-CM

## 2018-04-10 NOTE — Progress Notes (Signed)
Carelink Summary Report / Loop Recorder 

## 2018-04-14 NOTE — Telephone Encounter (Signed)
LVMOM requesting manual transmission for review. Gave Device Clinic phone number to call back.

## 2018-04-21 NOTE — Telephone Encounter (Signed)
Spoke with patient, walked her through transmission steps. Advised that if transmission is not received successfully, I will call her back on Monday. Patient verbalizes agreement with plan and is appreciative of call.

## 2018-05-03 NOTE — Telephone Encounter (Signed)
Dr. Johney FrameAllred reviewed LINQ report--no changes at this time, continue to monitor.

## 2018-05-12 ENCOUNTER — Ambulatory Visit (INDEPENDENT_AMBULATORY_CARE_PROVIDER_SITE_OTHER): Payer: Medicare Other

## 2018-05-12 DIAGNOSIS — I4891 Unspecified atrial fibrillation: Secondary | ICD-10-CM | POA: Diagnosis not present

## 2018-05-15 NOTE — Progress Notes (Signed)
Carelink Summary Report / Loop Recorder 

## 2018-05-27 LAB — CUP PACEART REMOTE DEVICE CHECK
Implantable Pulse Generator Implant Date: 20190218
MDC IDC SESS DTM: 20191110130715

## 2018-05-29 ENCOUNTER — Other Ambulatory Visit: Payer: Self-pay | Admitting: Internal Medicine

## 2018-06-01 ENCOUNTER — Other Ambulatory Visit: Payer: Self-pay | Admitting: Internal Medicine

## 2018-06-07 ENCOUNTER — Other Ambulatory Visit: Payer: Self-pay | Admitting: Internal Medicine

## 2018-06-14 ENCOUNTER — Ambulatory Visit (INDEPENDENT_AMBULATORY_CARE_PROVIDER_SITE_OTHER): Payer: Medicare Other

## 2018-06-14 DIAGNOSIS — I4891 Unspecified atrial fibrillation: Secondary | ICD-10-CM | POA: Diagnosis not present

## 2018-06-14 DIAGNOSIS — R002 Palpitations: Secondary | ICD-10-CM

## 2018-06-15 NOTE — Progress Notes (Signed)
Carelink Summary Report / Loop Recorder 

## 2018-06-16 LAB — CUP PACEART REMOTE DEVICE CHECK
Date Time Interrogation Session: 20200115134139
MDC IDC PG IMPLANT DT: 20190218

## 2018-06-18 LAB — CUP PACEART REMOTE DEVICE CHECK
Date Time Interrogation Session: 20191213133935
Implantable Pulse Generator Implant Date: 20190218

## 2018-07-17 ENCOUNTER — Ambulatory Visit (INDEPENDENT_AMBULATORY_CARE_PROVIDER_SITE_OTHER): Payer: Medicare Other

## 2018-07-17 DIAGNOSIS — I4891 Unspecified atrial fibrillation: Secondary | ICD-10-CM

## 2018-07-17 LAB — CUP PACEART REMOTE DEVICE CHECK
Date Time Interrogation Session: 20200217142938
Implantable Pulse Generator Implant Date: 20190218

## 2018-07-26 NOTE — Progress Notes (Signed)
Carelink Summary Report / Loop Recorder 

## 2018-08-15 ENCOUNTER — Other Ambulatory Visit: Payer: Self-pay | Admitting: Internal Medicine

## 2018-08-21 ENCOUNTER — Ambulatory Visit (INDEPENDENT_AMBULATORY_CARE_PROVIDER_SITE_OTHER): Payer: Medicare Other | Admitting: *Deleted

## 2018-08-21 ENCOUNTER — Other Ambulatory Visit: Payer: Self-pay

## 2018-08-21 DIAGNOSIS — I4891 Unspecified atrial fibrillation: Secondary | ICD-10-CM

## 2018-08-21 LAB — CUP PACEART REMOTE DEVICE CHECK
Date Time Interrogation Session: 20200321161017
Implantable Pulse Generator Implant Date: 20190218

## 2018-08-29 NOTE — Progress Notes (Signed)
Carelink Summary Report / Loop Recorder 

## 2018-09-21 ENCOUNTER — Other Ambulatory Visit: Payer: Self-pay

## 2018-09-21 ENCOUNTER — Ambulatory Visit (INDEPENDENT_AMBULATORY_CARE_PROVIDER_SITE_OTHER): Payer: Medicare Other | Admitting: *Deleted

## 2018-09-21 DIAGNOSIS — R002 Palpitations: Secondary | ICD-10-CM

## 2018-09-21 DIAGNOSIS — I4891 Unspecified atrial fibrillation: Secondary | ICD-10-CM | POA: Diagnosis not present

## 2018-09-21 LAB — CUP PACEART REMOTE DEVICE CHECK
Date Time Interrogation Session: 20200423141220
Implantable Pulse Generator Implant Date: 20190218

## 2018-09-29 NOTE — Progress Notes (Signed)
Carelink Summary Report / Loop Recorder 

## 2018-10-24 ENCOUNTER — Ambulatory Visit (INDEPENDENT_AMBULATORY_CARE_PROVIDER_SITE_OTHER): Payer: Medicare Other | Admitting: *Deleted

## 2018-10-24 DIAGNOSIS — R002 Palpitations: Secondary | ICD-10-CM

## 2018-10-24 DIAGNOSIS — I4891 Unspecified atrial fibrillation: Secondary | ICD-10-CM

## 2018-10-25 LAB — CUP PACEART REMOTE DEVICE CHECK
Date Time Interrogation Session: 20200526163929
Implantable Pulse Generator Implant Date: 20190218

## 2018-11-01 NOTE — Progress Notes (Signed)
Carelink Summary Report / Loop Recorder 

## 2018-11-27 ENCOUNTER — Ambulatory Visit (INDEPENDENT_AMBULATORY_CARE_PROVIDER_SITE_OTHER): Payer: Medicare Other | Admitting: *Deleted

## 2018-11-27 DIAGNOSIS — I480 Paroxysmal atrial fibrillation: Secondary | ICD-10-CM

## 2018-11-27 LAB — CUP PACEART REMOTE DEVICE CHECK
Date Time Interrogation Session: 20200628181059
Implantable Pulse Generator Implant Date: 20190218

## 2018-12-05 NOTE — Progress Notes (Signed)
Carelink Summary Report / Loop Recorder 

## 2018-12-20 ENCOUNTER — Telehealth: Payer: Self-pay

## 2018-12-20 NOTE — Telephone Encounter (Signed)
LMOVM for pt to return call to DC clinic, calling to assess if pt had symptoms w/ LINQ tachy alert; manual transmission also needed. Tachy episode detected 12/17/18 @ 1559, max V rate 188.

## 2018-12-22 NOTE — Telephone Encounter (Signed)
Spoke with patient patient concerning LINQ alert on 12/17/18 that occurred at 1559. Reports that she had no CP or SHOB. Pt reports she did not have a syncopal episode but felt light -headed.  Confirmed patient has been taking metoprolol as documented in Epic and had not missed any doses before episode.   Given instructions to continue to take metoprolol and ED precautions given. Patient verbalized understanding of instructions.

## 2018-12-29 ENCOUNTER — Ambulatory Visit (INDEPENDENT_AMBULATORY_CARE_PROVIDER_SITE_OTHER): Payer: Medicare Other | Admitting: *Deleted

## 2018-12-29 DIAGNOSIS — I480 Paroxysmal atrial fibrillation: Secondary | ICD-10-CM

## 2018-12-30 LAB — CUP PACEART REMOTE DEVICE CHECK
Date Time Interrogation Session: 20200731184210
Implantable Pulse Generator Implant Date: 20190218

## 2019-01-04 NOTE — Progress Notes (Signed)
Carelink Summary Report / Loop Recorder 

## 2019-01-18 ENCOUNTER — Other Ambulatory Visit: Payer: Self-pay | Admitting: Internal Medicine

## 2019-01-31 ENCOUNTER — Ambulatory Visit (INDEPENDENT_AMBULATORY_CARE_PROVIDER_SITE_OTHER): Payer: Medicare Other | Admitting: *Deleted

## 2019-01-31 DIAGNOSIS — I480 Paroxysmal atrial fibrillation: Secondary | ICD-10-CM

## 2019-02-01 LAB — CUP PACEART REMOTE DEVICE CHECK
Date Time Interrogation Session: 20200902194044
Implantable Pulse Generator Implant Date: 20190218

## 2019-02-16 NOTE — Progress Notes (Signed)
Carelink Summary Report / Loop Recorder 

## 2019-03-05 ENCOUNTER — Ambulatory Visit (INDEPENDENT_AMBULATORY_CARE_PROVIDER_SITE_OTHER): Payer: Medicare Other | Admitting: *Deleted

## 2019-03-05 ENCOUNTER — Other Ambulatory Visit: Payer: Self-pay | Admitting: Internal Medicine

## 2019-03-05 DIAGNOSIS — I480 Paroxysmal atrial fibrillation: Secondary | ICD-10-CM | POA: Diagnosis not present

## 2019-03-06 LAB — CUP PACEART REMOTE DEVICE CHECK
Date Time Interrogation Session: 20201005193900
Implantable Pulse Generator Implant Date: 20190218

## 2019-03-07 ENCOUNTER — Telehealth: Payer: Self-pay

## 2019-03-07 NOTE — Telephone Encounter (Signed)
Spoke with patient about episode of "AF" she feels occurred from 1030 am to 1:30 pm today. She reports feeling her heart racing and then intermittent episodes of chest pressure , dizziness, and SOB  during the 3 hours she felt the elevated HR. She feels no symptoms and that her HR is at baseline at this time. She will contact [facility] tech support to assist with troubleshooting her monitor and notify the DC when she sends transmission to evaluate event.

## 2019-03-07 NOTE — Telephone Encounter (Signed)
Pt called stating she feels like she is in A-fib for several hours. Her monitor gave her the error code 3230. I told her to let the receiver charge for 30 minutes then I will give her a call back.

## 2019-03-07 NOTE — Telephone Encounter (Signed)
I tried to help the pt send a manual transmission again but the monitor gave 3230 again. I gave the pt the number to [facility] support to get additional help. I let her talk with Jenny Reichmann, RN.

## 2019-03-08 NOTE — Telephone Encounter (Signed)
Transmission received, pt with AF episode 10/7, duration 1 hour, 14 minutes. Per Dr Jackalyn Lombard last note, would re-consider anticoagulation for recurrent AF.  Will have her added on to Dr Allred's next clinic schedule as a virtual visit to discuss further.  Chanetta Marshall, NP 03/08/2019 7:12 AM

## 2019-03-08 NOTE — Telephone Encounter (Signed)
I told the pt per Luetta Nutting, NP note she did have an AF episode 10/7/, duration of 1 hour, 14 minutes. The pt states she feel like she was in AF a lot longer than that. I told her that Amber recommends she have a virtual visit with Dr. Rayann Heman next week. [place] spoke with the pt and set the appointment up. The pt thanked me for my help.

## 2019-03-12 NOTE — Progress Notes (Signed)
Carelink Summary Report / Loop Recorder 

## 2019-03-16 ENCOUNTER — Encounter: Payer: Self-pay | Admitting: Internal Medicine

## 2019-03-16 ENCOUNTER — Telehealth (INDEPENDENT_AMBULATORY_CARE_PROVIDER_SITE_OTHER): Payer: Medicare Other | Admitting: Internal Medicine

## 2019-03-16 ENCOUNTER — Other Ambulatory Visit: Payer: Self-pay

## 2019-03-16 VITALS — Ht 65.5 in | Wt 130.0 lb

## 2019-03-16 DIAGNOSIS — I480 Paroxysmal atrial fibrillation: Secondary | ICD-10-CM | POA: Diagnosis not present

## 2019-03-16 NOTE — Progress Notes (Signed)
Electrophysiology TeleHealth Note  Due to national recommendations of social distancing due to COVID 19, an audio telehealth visit is felt to be most appropriate for this patient at this time.  Verbal consent was obtained by me for the telehealth visit today.  The patient does not have capability for a virtual visit.  A phone visit is therefore required today.   Date:  03/16/2019   ID:  Jessica Fowler, DOB 1946-04-14, MRN [id number]  Location: patient's home  Provider location:  [facility]  Evaluation Performed: Follow-up visit  PCP:  [name], MD   Electrophysiologist:  Dr Johney Frame  Chief Complaint:  palpitations  History of Present Illness:    Jessica Fowler is a 73 y.o. female who presents via telehealth conferencing today.  Since last being seen in our clinic, the patient reports doing very well.  Today, she denies symptoms of chest pain, shortness of breath,  lower extremity edema, dizziness, presyncope, or syncope. She has occasional afib episodes.  She reports symptoms of palpitations.  She is very anxious during her afib. She has some dizziness.   Episodes last about an hour or so.   The patient is otherwise without complaint today.  The patient denies symptoms of fevers, chills, cough, or new SOB worrisome for COVID 19.  Past Medical History:  Diagnosis Date  . Allergic rhinitis   . Atrial fibrillation (HCC)    s/p ablation 02/21/10  . Hyperlipidemia   . Pneumonia   . S/P ablation of atrial flutter 02/21/2010    Past Surgical History:  Procedure Laterality Date  . CARDIAC CATHETERIZATION  04/18/2009   Normal coronary arteries, EF 60%, 1+ MR  . EP study  02/20/2010   CTI and PVI ablation by JA  . LOOP RECORDER INSERTION N/A 07/18/2017   Procedure: LOOP RECORDER INSERTION;  Surgeon: Hillis Range, MD;  Location: [facility];  Service: Cardiovascular;  Laterality: N/A;    Current Outpatient Medications  Medication Sig Dispense Refill  .  calcium-vitamin D (OSCAL 500/200 D-3) 500-200 MG-UNIT per tablet Take 1 tablet by mouth 3 (three) times a week.     . Cyanocobalamin (VITAMIN B-12 PO) Take 1 tablet by mouth daily.    . dorzolamide-timolol (COSOPT) 22.3-6.8 MG/ML ophthalmic solution Place 1 drop into both eyes 2 (two) times daily.    . fluticasone (FLONASE) 50 MCG/ACT nasal spray Place 2 sprays into both nostrils daily as needed for allergies.     Marland Kitchen latanoprost (XALATAN) 0.005 % ophthalmic solution Place 1 drop into both eyes at bedtime.     . metoprolol succinate (TOPROL-XL) 25 MG 24 hr tablet Take 1 tablet (25 mg total) by mouth every evening. 1st warning: Overdue for f/u. Must schedule/keep appt to continue refills. 30 tablet 0  . metoprolol tartrate (LOPRESSOR) 25 MG tablet TAKE 1 TABLET BY MOUTH EVERY MORNING 90 tablet 3  . nitroGLYCERIN (NITROSTAT) 0.4 MG SL tablet Place 1 tablet (0.4 mg total) under the tongue every 5 (five) minutes as needed for chest pain (MAX 3 TABLETS). 30 tablet 11  . omeprazole (PRILOSEC) 40 MG capsule Take 40 mg by mouth daily as needed (for acid reflux).   5   No current facility-administered medications for this visit.     Allergies:   Pollen extract and Brimonidine tartrate   Social History:  The patient  reports that she has never smoked. She has never used smokeless tobacco. She reports that she does not drink alcohol or use  drugs.   Family History:  The patient's family history includes Colon cancer in her father; Heart failure in her mother; Hyperthyroidism in her son; Hypothyroidism in her daughter.   ROS:  Please see the history of present illness.   All other systems are personally reviewed and negative.    Exam:    Vital Signs:  Ht 5' 5.5" (1.664 m)   Wt 130 lb (59 kg)   BMI 21.30 kg/m   Well sounding, alert and conversant   Labs/Other Tests and Data Reviewed:    Recent Labs: No results found for requested labs within last 8760 hours.   Wt Readings from Last 3 Encounters:   03/16/19 130 lb (59 kg)  12/09/17 124 lb (56.2 kg)  07/18/17 132 lb (59.9 kg)     Last device remote is reviewed from [facility] PDF which reveals normal device function, afib reviewed on ILR   ASSESSMENT & PLAN:    1.  Paroxysmal atrial fibrillation Overall burden is low chads2vasc score is 2.  Per guidelines, she does not require long term anticoagulation She is s/p PVI and CTI ablation by me 01/2010. The patient has symptomatic, recurrent atrial fibrillation. she has done great s/p prior ablation  Therapeutic strategies for afib including medicine (flecainide) and repeat ablation were discussed in detail with the patient today. At this time, she would prefer to continue watchful waiting.   Follow-up:  AF clinic in 3 months Continue remote monitoring of her ILR  Patient Risk:  after full review of this patients clinical status, I feel that they are at moderate risk at this time.  Today, I have spent 15 minutes with the patient with telehealth technology discussing arrhythmia management .    Army Fossa, MD  03/16/2019 10:01 AM     [facility] [address] [place] [address] [phone] (office) [phone] (fax)

## 2019-03-17 ENCOUNTER — Other Ambulatory Visit: Payer: Self-pay | Admitting: Internal Medicine

## 2019-03-25 NOTE — Progress Notes (Signed)
Cardiology Office Note Date:  03/25/2019  Patient ID:  Jessica, Fowler [birth year], MRN [id number] PCP:  Leone Haven, MD  Electrophysiologist: Dr. Rayann Heman    Chief Complaint:  AFib, palpitations  History of Present Illness: Jessica Fowler is a 73 y.o. female with history of PAFib, AFlutter s/p AFlutter and PVI ablation 02/20/10 with Dr. Rayann Heman, HLD, comes in today to be seen for Dr. Rayann Heman.  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  She was seen by myself in Feb 2018, she had noticed in the previous couple weeks increasing palpitations, she described days that all day she feels like she is having on/off skipped/extra beats, eventually felt she had become aware of every heart beat she has.  No CP or SOB.  She was recovering from her Whipple surgery and had become increasingly more active, no dizziness, near syncope or syncope.  No changes were made at that visit with plans for holter monitoring which she had (48 hours) with only rare PACs and nonsustained AT.    She saw Dr. Rayann Heman in October 2018, he notes that she had been struggling with inability to gain weight post Whipple surgery, otherwise without much in the way of palpitations, did get winded with exertion and an echo was ordered.   Planned for PA/NP visits Q 6 months.  Her echo noted LVEF 56-31%, normal diastolic function, no significant VHD or findings.  I saw her in jan 2019 as an add-on visit with an increase in her palpitations over the last week.  She stated though after calling and getting the appointment, thatthey seemed to simmer down quite a bit.  She denied CP, reported chronic problems with GERD, but no CP.  She explained over the last year she will feel intermittent skipped/extra beats but in the last week this has ramped up.  She denied any particular changes personally  this week other then the holidays.  No new medicines.  She was to have started on a new medicine for her pancrease to take with meals/snacks but was unable to afford it and had not started it, she planned to f/u with her GI about this.  She reportd 2 different feeling palpitations.  One is brief fast beats "my heart goes crazy" these last a few minutes and make her feel a little winded.  She has another that feels like a double heart beat the second is very strong and then stops a second.  These are particularly anxiety provoking for her, can happen every 2-3 beats for a while then intermittently.  No dizziness, near syncope or syncope.   She stated me she is frankly aware of every heart beat she has.  We planned for f/u monitoring (not done).  She saw Dr. Rayann Heman in f/u and underwent ILR implant to better evaluate her symptoms, and monitor for recurrent AFib and potential need to reconsider a/c if recurrent AF found.  At her wound check visit, 20 minute duration of AF noted, by RN note, no change recommended by Dr. Rayann Heman given minimal amount to date.  Subsequent transmission  noted short AF episode. Planned to monitor without changes unless duration exceed 28minutes.  SI saw her in clinic 11/2017 by myself, from a palpitations standpoint she was doing much better  she wasn't feel them like she had been earlier in the year.  She had  no CP, no near syncope or syncope.  She mdentioned occasionally feeling the need to take in a deep breath like she needs more air.  No SOB otherwise, doing her work outside as usual.  She c/o feeling like she had much energy and is having a hard time keeping weight on.  She continues to f/u with GI, was never able to get started on the regime of meds for her pancreas 2/2 cost restrictions.  03/16/2019 she had a virtual visit with Dr. Johney FrameAllred, was having some palpitations, these making her anxious, lasting about an hour. Given low risk score, not on 1/c, noting low burden as well.   Dr. Johney FrameAllred discussed starting Flecainide vs a repeat PVI ablation, though she preferred to hold off, maintaining a more conservative approach.   She kept her appointment today because she says Dr. Johney FrameAllred did not tell her to cancel it and when they called her she thought best to keep it in place since she had been having some AFib lately. Since her virtual visit with Dr. Johney FrameAllred she has not had any further episodes (by device interrogation or symptoms).  She is aware of every heart beat she has, even in regular rhythm, and when she has AFib she gets very anxious and worried.  The Afib on 10/7, eventually making her feel weak, lightheaded and near syncopal. Outside of her AFib though feeling well, no CP or SOB, no exertional intolerances.  No syncope   Device information: MDT ILR implanted 07/18/17, to monitor post PVI ablation/palpitations   Past Medical History:  Diagnosis Date  . Allergic rhinitis   . Atrial fibrillation (HCC)    s/p ablation 02/21/10  . Hyperlipidemia   . Pneumonia   . S/P ablation of atrial flutter 02/21/2010    Past Surgical History:  Procedure Laterality Date  . CARDIAC CATHETERIZATION  04/18/2009   Normal coronary arteries, EF 60%, 1+ MR  . EP study  02/20/2010   CTI and PVI ablation by JA  . LOOP RECORDER INSERTION N/A 07/18/2017   Procedure: LOOP RECORDER INSERTION;  Surgeon: Hillis RangeAllred, James, MD;  Location: [facility];  Service: Cardiovascular;  Laterality: N/A;    Current Outpatient Medications  Medication Sig Dispense Refill  . calcium-vitamin D (OSCAL 500/200 D-3) 500-200 MG-UNIT per tablet Take 1 tablet by mouth 3 (three) times a week.     . Cyanocobalamin (VITAMIN B-12 PO) Take 1 tablet by mouth daily.    . dorzolamide-timolol (COSOPT) 22.3-6.8 MG/ML ophthalmic solution Place 1 drop into both eyes 2 (two) times daily.    . fluticasone (FLONASE) 50 MCG/ACT nasal spray Place 2 sprays into both nostrils daily as needed for allergies.     Marland Kitchen.  latanoprost (XALATAN) 0.005 % ophthalmic solution Place 1 drop into both eyes at bedtime.     . metoprolol succinate (TOPROL-XL) 25 MG 24 hr tablet Take 1 tablet (25 mg total) by mouth every evening. 1st warning: Overdue for f/u. Must schedule/keep appt to continue refills. 30 tablet 0  . metoprolol tartrate (LOPRESSOR) 25 MG tablet TAKE 1 TABLET BY MOUTH EVERY DAY IN THE MORNING 90 tablet 3  . nitroGLYCERIN (NITROSTAT) 0.4 MG SL tablet Place 1 tablet (0.4 mg total) under the tongue every 5 (five) minutes as needed for chest pain (MAX 3 TABLETS). 30 tablet 11  . omeprazole (PRILOSEC) 40 MG capsule Take 40 mg by mouth daily as needed (for acid reflux).   5   No current facility-administered medications for this visit.  Allergies:   Pollen extract and Brimonidine tartrate   Social History:  The patient  reports that she has never smoked. She has never used smokeless tobacco. She reports that she does not drink alcohol or use drugs.   Family History:  The patient's family history includes Colon cancer in her father; Heart failure in her mother; Hyperthyroidism in her son; Hypothyroidism in her daughter.  ROS:  Please see the history of present illness.   All other systems are reviewed and otherwise negative.   PHYSICAL EXAM:  VS:  There were no vitals taken for this visit. BMI: There is no height or weight on file to calculate BMI. Well nourished, well developed, in no acute distress  HEENT: normocephalic, atraumatic  Neck: no JVD, carotid bruits or masses Cardiac: RRR; no significant murmurs, no rubs, or gallops Lungs: CTA b/l no wheezing, rhonchi or rales  Abd: soft, nontender MS: no deformity, age appropriate atrophy Ext:  no edema  Skin: warm and dry, no rash Neuro:  No gross deficits appreciated Psych: euthymic mood, full affect  EKG:  Done today and reviewed by myeslf: SB, 58bpm , normal otherwise ILR interrogation done today and reviewed by myself:  Battery status is good, R  waves 0.39 46 AF episodes, 4 tachy All EGMs reviewed, tachy episoeds appear to have the same morphology as native QRS and look like AFib/RVR AFib rates uncontrolled Longest episode was 10/7, 1 hour , and 2-2minute episodes Otherwise are brief, 2-8 minutes, most are shorter    03/16/17: TTE Study Conclusions - Left ventricle: The cavity size was normal. Systolic function was   normal. The estimated ejection fraction was in the range of 55%   to 60%. Wall motion was normal; there were no regional wall   motion abnormalities. Left ventricular diastolic function   parameters were normal. - Aortic valve: Transvalvular velocity was within the normal range.   There was no stenosis. There was no regurgitation. - Mitral valve: Mild prolapse, involving the anterior leaflet.   Transvalvular velocity was within the normal range. There was no   evidence for stenosis. There was trivial regurgitation. - Right ventricle: The cavity size was normal. Wall thickness was   normal. Systolic function was normal. - Atrial septum: No defect or patent foramen ovale was identified. - Tricuspid valve: There was mild regurgitation. - Pulmonary arteries: Systolic pressure was within the normal   range. PA peak pressure: 23 mm Hg (S). - Pericardium, extracardiac: A trivial pericardial effusion was   identified.  01/08/16: Coronary CTangio IMPRESSION: 1. Coronary calcium score of 6. This was 26 percentile for age and sex matched control. 2. Normal coronary origin.  Right dominance. 3. 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 4.  No incidental findings were seen.  12/29/15: Exercise stress myoview, reached target HR  Nuclear stress EF: 64%.  Blood pressure demonstrated a normal response to exercise.  Horizontal ST segment depression ST segment depression of 1 mm was noted during stress in the II, III, aVF, V5 and V6 leads, beginning at 4 minutes of stress, and  returning to baseline after 5-9 minutes of recovery.  This is an intermediate risk study.  The left ventricular ejection fraction is normal (55-65%).  1. No evidence for ischemia or infarction on perfusion imaging (normal).  2. There was ST depression lasting about 7 minutes into recovery in inferior leads + leads V5/V6 accompanied by chest pain during recovery (dyspnea limited exercise).  3. Normal LV systolic  function.  4. Discrepant findings.  Given angina and abnormal ETT, would rate an intermediate risk study.  However, perfusion images were normal.  Would consider further investigation, ?coronary CT angiogram  12/29/15: Echocardiogram Study Conclusions - Left ventricle: The cavity size was normal. Systolic function was   normal. The estimated ejection fraction was in the range of 55%   to 60%. Wall motion was normal; there were no regional wall   motion abnormalities. Left ventricular diastolic function   parameters were normal.  No significant VHD described  Recent Labs: No results found for requested labs within last 8760 hours.  No results found for requested labs within last 8760 hours.   CrCl cannot be calculated (Patient's most recent lab result is older than the maximum 21 days allowed.).   Wt Readings from Last 3 Encounters:  03/16/19 130 lb (59 kg)  12/09/17 124 lb (56.2 kg)  07/18/17 132 lb (59.9 kg)     Other studies reviewed: Additional studies/records reviewed today include: summarized above  ASSESSMENT AND PLAN:  1. Paroxysmal afib     H/o PVI ablation with Dr. Johney Frame in 01/2010     CHA2DS2Vasc is 1 (age), off a/c  AFib burden overall is low, <1% While she is symptomatic, she is very reluctant to consider medication changes or procedure We visited Dr. Jenel Lucks thoughts regarding Flecainide or repeat ablation. She does not want to consider ablation yet, and really does not want to consider "stronng" medicines  We will change her Toprol to 25mg  BID and  change her lopressor to PRN only for palpitations She would like to have a fairly close follow up visit and will see her back in 6 weeks or so, sooner if needed.    Disposition: as above    Current medicines are reviewed at length with the patient today.  The patient did not have any concerns regarding medicines.  , PA-C 03/25/2019 12:15 PM     [facility] [address] [place] [phone] (office)  [phone] (fax)

## 2019-03-26 ENCOUNTER — Encounter: Payer: Self-pay | Admitting: Physician Assistant

## 2019-03-27 ENCOUNTER — Other Ambulatory Visit: Payer: Self-pay

## 2019-03-27 ENCOUNTER — Encounter: Payer: Self-pay | Admitting: Physician Assistant

## 2019-03-27 ENCOUNTER — Telehealth: Payer: Self-pay | Admitting: Physician Assistant

## 2019-03-27 ENCOUNTER — Ambulatory Visit (INDEPENDENT_AMBULATORY_CARE_PROVIDER_SITE_OTHER): Payer: Medicare Other | Admitting: Physician Assistant

## 2019-03-27 VITALS — BP 124/66 | HR 58 | Ht 65.5 in | Wt 133.0 lb

## 2019-03-27 DIAGNOSIS — R002 Palpitations: Secondary | ICD-10-CM | POA: Diagnosis not present

## 2019-03-27 DIAGNOSIS — I4891 Unspecified atrial fibrillation: Secondary | ICD-10-CM | POA: Diagnosis not present

## 2019-03-27 MED ORDER — NITROGLYCERIN 0.4 MG SL SUBL: 0.4000 mg | SUBLINGUAL_TABLET | SUBLINGUAL | 11 refills | Status: DC | PRN

## 2019-03-27 MED ORDER — METOPROLOL SUCCINATE ER 25 MG PO TB24: 25.0000 mg | ORAL_TABLET | Freq: Two times a day (BID) | ORAL | 3 refills | Status: DC

## 2019-03-27 MED ORDER — METOPROLOL TARTRATE 25 MG PO TABS: 25.0000 mg | ORAL_TABLET | Freq: Two times a day (BID) | ORAL | 3 refills | Status: DC | PRN

## 2019-03-27 NOTE — Patient Instructions (Signed)
Medication Instructions:  Your physician has recommended you make the following change in your medication:  1. INCREASE TOPROL XL TO 25 MG TWICE DAILY.  2. TAKE LOPRESSOR 25 MG TWICE DAILY AS NEEDED FOR PALPITATIONS  Refills have been sent in to your pharmacy for your Cardiac medications.  *If you need a refill on your cardiac medications before your next appointment, please call your pharmacy*  Lab Work: NONE If you have labs (blood work) drawn today and your tests are completely normal, you will receive your results only by: Marland Kitchen MyChart Message (if you have MyChart) OR . A paper copy in the mail If you have any lab test that is abnormal or we need to change your treatment, we will call you to review the results.  Testing/Procedures: NONE  Follow-Up: At [facility], you and your health needs are our priority.  As part of our continuing mission to provide you with exceptional heart care, we have created designated Provider Care Teams.  These Care Teams include your primary Cardiologist (physician) and Advanced Practice Providers (APPs -  Physician Assistants and Nurse Practitioners) who all work together to provide you with the care you need, when you need it.  Your next appointment:   6 WEEKS  The format for your next appointment:   In Person  Provider:   Tommye Standard, PA-C

## 2019-03-27 NOTE — Telephone Encounter (Signed)
New Message:     Pt said she was just here to see Renee. She would like a call today please, she need clarification on what she was told about her medicine.

## 2019-04-09 ENCOUNTER — Ambulatory Visit (INDEPENDENT_AMBULATORY_CARE_PROVIDER_SITE_OTHER): Payer: Medicare Other | Admitting: *Deleted

## 2019-04-09 DIAGNOSIS — I480 Paroxysmal atrial fibrillation: Secondary | ICD-10-CM

## 2019-04-10 LAB — CUP PACEART REMOTE DEVICE CHECK
Date Time Interrogation Session: 20201107194202
Implantable Pulse Generator Implant Date: 20190218

## 2019-05-05 NOTE — Progress Notes (Signed)
Carelink Summary Report / Loop Recorder 

## 2019-05-10 ENCOUNTER — Ambulatory Visit (INDEPENDENT_AMBULATORY_CARE_PROVIDER_SITE_OTHER): Payer: Medicare Other | Admitting: *Deleted

## 2019-05-10 DIAGNOSIS — I480 Paroxysmal atrial fibrillation: Secondary | ICD-10-CM

## 2019-05-10 LAB — CUP PACEART REMOTE DEVICE CHECK
Date Time Interrogation Session: 20201210144511
Implantable Pulse Generator Implant Date: 20190218

## 2019-05-14 NOTE — Progress Notes (Deleted)
Cardiology Office Note Date:  05/14/2019  Patient ID:  Jessica Fowler, DOB 11/13/1945, MRN [id number] PCP:  Gaspar SkeetersStambaugh, Merris, MD  Electrophysiologist: Dr. Johney FrameAllred    Chief Complaint:  AFib, palpitations  History of Present Illness: Jessica Fowler is a 73 y.o. female with history of PAFib, AFlutter s/p AFlutter and PVI ablation 02/20/10 with Dr. Johney FrameAllred, HLD, comes in today to be seen for Dr. Johney FrameAllred.  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  She was seen by myself in Feb 2018, she had noticed in the previous couple weeks increasing palpitations, she described days that all day she feels like she is having on/off skipped/extra beats, eventually felt she had become aware of every heart beat she has.  No CP or SOB.  She was recovering from her Whipple surgery and had become increasingly more active, no dizziness, near syncope or syncope.  No changes were made at that visit with plans for holter monitoring which she had (48 hours) with only rare PACs and nonsustained AT.    She saw Dr. Johney FrameAllred in October 2018, he notes that she had been struggling with inability to gain weight post Whipple surgery, otherwise without much in the way of palpitations, did get winded with exertion and an echo was ordered.   Planned for PA/NP visits Q 6 months.  Her echo noted LVEF 55-60%, normal diastolic function, no significant VHD or findings.  I saw her in jan 2019 as an add-on visit with an increase in her palpitations over the last week.  She stated though after calling and getting the appointment, thatthey seemed to simmer down quite a bit.  She denied CP, reported chronic problems with GERD, but no CP.  She explained over the last year she will feel intermittent skipped/extra beats but in the last week this has ramped up.  She denied any particular changes personally  this week other then the holidays.  No new medicines.  She was to have started on a new medicine for her pancrease to take with meals/snacks but was unable to afford it and had not started it, she planned to f/u with her GI about this.  She reportd 2 different feeling palpitations.  One is brief fast beats "my heart goes crazy" these last a few minutes and make her feel a little winded.  She has another that feels like a double heart beat the second is very strong and then stops a second.  These are particularly anxiety provoking for her, can happen every 2-3 beats for a while then intermittently.  No dizziness, near syncope or syncope.   She stated me she is frankly aware of every heart beat she has.  We planned for f/u monitoring (not done).  She saw Dr. Johney FrameAllred in f/u and underwent ILR implant to better evaluate her symptoms, and monitor for recurrent AFib and potential need to reconsider a/c if recurrent AF found.  At her wound check visit, 20 minute duration of AF noted, by RN note, no change recommended by Dr. Johney FrameAllred given minimal amount to date.  Subsequent transmission  noted short AF episode. Planned to monitor without changes unless duration exceed 3minutes.  I saw her in clinic 11/2017 by myself, from a palpitations standpoint she was doing much better  she wasn't feel them like she had been earlier in the year.  She had  no CP, no near syncope or syncope.  She mdentioned occasionally feeling the need to take in a deep breath like she needs more air.  No SOB otherwise, doing her work outside as usual.  She c/o feeling like she had much energy and is having a hard time keeping weight on.  She continues to f/u with GI, was never able to get started on the regime of meds for her pancreas 2/2 cost restrictions.  03/16/2019 she had a virtual visit with Dr. Johney Frame, was having some palpitations, these making her anxious, lasting about an hour. Given low risk score, not on 1/c, noting low burden as well.   Dr. Johney Frame discussed starting Flecainide vs a repeat PVI ablation, though she preferred to hold off, maintaining a more conservative approach.   I saw her 03/27/2019, despite hr recent visit with Dr. Johney Frame, she kept her appointment because Dr. Johney Frame did not tell her to cancel it and when they called her she thought best to keep it in place since she had been having some AFib lately. Since her virtual visit with Dr. Johney Frame she had not had any further episodes (by device interrogation or symptoms).    Battery status is good, R waves 0.39 46 AF episodes, 4 tachy All EGMs reviewed, tachy episoeds appear to have the same morphology as native QRS and look like AFib/RVR AFib rates uncontrolled Longest episode was 10/7, 1 hour , and 2-84minute episodes Otherwise are brief, 2-8 minutes, most are shorter   Noted she is aware of every heart beat she has, even in regular rhythm, and when she has AFib she gets very anxious and worried.  The Afib on 10/7, eventually making her feel weak, lightheaded and near syncopal. Outside of her AFib though feeling well, no CP or SOB, no exertional intolerances.  No syncope. We discussed Dr. Jenel Lucks thoughts/recommendations to start Flecainide or repeat her ablation, she was not ready to do either. Her Toprol increased to BID and her lopressor changed to a PRN.  The patient requested early follow up and planned for 6 week f/u.  *** palps? *** tlerating toprol? BP, brady? *** use of prn lopressor? *** labs?   Device information: MDT ILR implanted 07/18/17, to monitor post PVI ablation/palpitations   Past Medical History:  Diagnosis Date  . Allergic rhinitis   . Atrial fibrillation (HCC)    s/p ablation 02/21/10  . Hyperlipidemia   . Pneumonia   . S/P ablation of atrial flutter 02/21/2010    Past Surgical History:  Procedure Laterality Date  . CARDIAC CATHETERIZATION  04/18/2009   Normal coronary arteries, EF 60%, 1+ MR  . EP study  02/20/2010    CTI and PVI ablation by [facility]  . LOOP RECORDER INSERTION N/A 07/18/2017   Procedure: LOOP RECORDER INSERTION;  Surgeon: Hillis Range, MD;  Location: [facility];  Service: Cardiovascular;  Laterality: N/A;    Current Outpatient Medications  Medication Sig Dispense Refill  . calcium-vitamin D (OSCAL 500/200 D-3) 500-200 MG-UNIT per tablet Take 1 tablet by mouth 3 (three) times a week.     . Cyanocobalamin (VITAMIN B-12 PO) Take 1 tablet by mouth daily.    . dorzolamide-timolol (COSOPT) 22.3-6.8 MG/ML ophthalmic solution Place 1 drop into both eyes 2 (two) times daily.    . fluticasone (FLONASE) 50 MCG/ACT nasal spray Place 2 sprays into both nostrils daily as needed for allergies.     Marland Kitchen latanoprost (XALATAN) 0.005 % ophthalmic solution Place 1 drop into both eyes  at bedtime.     . metoprolol succinate (TOPROL XL) 25 MG 24 hr tablet Take 1 tablet (25 mg total) by mouth 2 (two) times daily. 180 tablet 3  . metoprolol tartrate (LOPRESSOR) 25 MG tablet Take 1 tablet (25 mg total) by mouth 2 (two) times daily as needed (PALPITATIONS). 90 tablet 3  . nitroGLYCERIN (NITROSTAT) 0.4 MG SL tablet Place 1 tablet (0.4 mg total) under the tongue every 5 (five) minutes as needed for chest pain (MAX 3 TABLETS). 30 tablet 11  . omeprazole (PRILOSEC) 40 MG capsule Take 40 mg by mouth daily as needed (for acid reflux).   5   No current facility-administered medications for this visit.    Allergies:   Pollen extract and Brimonidine tartrate   Social History:  The patient  reports that she has never smoked. She has never used smokeless tobacco. She reports that she does not drink alcohol or use drugs.   Family History:  The patient's family history includes Colon cancer in her father; Heart failure in her mother; Hyperthyroidism in her son; Hypothyroidism in her daughter.  ROS:  Please see the history of present illness.   All other systems are reviewed and otherwise negative.   PHYSICAL EXAM:  VS:   There were no vitals taken for this visit. BMI: There is no height or weight on file to calculate BMI. Well nourished, well developed, in no acute distress  HEENT: normocephalic, atraumatic  Neck: no JVD, carotid bruits or masses Cardiac: *** RRR; no significant murmurs, no rubs, or gallops Lungs: *** CTA b/l no wheezing, rhonchi or rales  Abd: soft, nontender MS: no deformity, age appropriate atrophy Ext:  *** no edema  Skin: warm and dry, no rash Neuro:  No gross deficits appreciated Psych: euthymic mood, full affect  EKG:  Not done today ILR interrogation done today and reviewed by myself:  ***   03/16/17: TTE Study Conclusions - Left ventricle: The cavity size was normal. Systolic function was   normal. The estimated ejection fraction was in the range of 55%   to 60%. Wall motion was normal; there were no regional wall   motion abnormalities. Left ventricular diastolic function   parameters were normal. - Aortic valve: Transvalvular velocity was within the normal range.   There was no stenosis. There was no regurgitation. - Mitral valve: Mild prolapse, involving the anterior leaflet.   Transvalvular velocity was within the normal range. There was no   evidence for stenosis. There was trivial regurgitation. - Right ventricle: The cavity size was normal. Wall thickness was   normal. Systolic function was normal. - Atrial septum: No defect or patent foramen ovale was identified. - Tricuspid valve: There was mild regurgitation. - Pulmonary arteries: Systolic pressure was within the normal   range. PA peak pressure: 23 mm Hg (S). - Pericardium, extracardiac: A trivial pericardial effusion was   identified.  01/08/16: Coronary CTangio IMPRESSION: 1. Coronary calcium score of 6. This was 79 percentile for age and sex matched control. 2. Normal coronary origin.  Right dominance. 3. 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 4.  No incidental  findings were seen.  12/29/15: Exercise stress myoview, reached target HR  Nuclear stress EF: 64%.  Blood pressure demonstrated a normal response to exercise.  Horizontal ST segment depression ST segment depression of 1 mm was noted during stress in the II, III, aVF, V5 and V6 leads, beginning at 4 minutes of stress, and returning to  baseline after 5-9 minutes of recovery.  This is an intermediate risk study.  The left ventricular ejection fraction is normal (55-65%).  1. No evidence for ischemia or infarction on perfusion imaging (normal).  2. There was ST depression lasting about 7 minutes into recovery in inferior leads + leads V5/V6 accompanied by chest pain during recovery (dyspnea limited exercise).  3. Normal LV systolic function.  4. Discrepant findings.  Given angina and abnormal ETT, would rate an intermediate risk study.  However, perfusion images were normal.  Would consider further investigation, ?coronary CT angiogram  12/29/15: Echocardiogram Study Conclusions - Left ventricle: The cavity size was normal. Systolic function was   normal. The estimated ejection fraction was in the range of 55%   to 60%. Wall motion was normal; there were no regional wall   motion abnormalities. Left ventricular diastolic function   parameters were normal.  No significant VHD described  Recent Labs: No results found for requested labs within last 8760 hours.  No results found for requested labs within last 8760 hours.   CrCl cannot be calculated (Patient's most recent lab result is older than the maximum 21 days allowed.).   Wt Readings from Last 3 Encounters:  03/27/19 133 lb (60.3 kg)  03/16/19 130 lb (59 kg)  12/09/17 124 lb (56.2 kg)     Other studies reviewed: Additional studies/records reviewed today include: summarized above  ASSESSMENT AND PLAN:  1. Paroxysmal afib     H/o PVI ablation with Dr. Johney Frame in 01/2010     CHA2DS2Vasc is 1 (age), off a/c  ***    Disposition: ***    Current medicines are reviewed at length with the patient today.  The patient did not have any concerns regarding medicines.  Judith Blonder, PA-C 05/14/2019 5:06 AM     [facility] [address] [place] [address] [phone] (office)  [phone] (fax)

## 2019-05-15 ENCOUNTER — Ambulatory Visit: Payer: Medicare Other | Admitting: Physician Assistant

## 2019-06-12 ENCOUNTER — Ambulatory Visit (INDEPENDENT_AMBULATORY_CARE_PROVIDER_SITE_OTHER): Payer: Medicare Other | Admitting: *Deleted

## 2019-06-12 DIAGNOSIS — I480 Paroxysmal atrial fibrillation: Secondary | ICD-10-CM | POA: Diagnosis not present

## 2019-06-12 LAB — CUP PACEART REMOTE DEVICE CHECK
Date Time Interrogation Session: 20210112151302
Implantable Pulse Generator Implant Date: 20190218

## 2019-06-12 NOTE — Progress Notes (Signed)
ILR remote 

## 2019-06-25 ENCOUNTER — Ambulatory Visit: Payer: Medicare Other | Admitting: Physician Assistant

## 2019-07-13 ENCOUNTER — Ambulatory Visit (INDEPENDENT_AMBULATORY_CARE_PROVIDER_SITE_OTHER): Payer: Medicare Other | Admitting: *Deleted

## 2019-07-13 DIAGNOSIS — I480 Paroxysmal atrial fibrillation: Secondary | ICD-10-CM | POA: Diagnosis not present

## 2019-07-13 LAB — CUP PACEART REMOTE DEVICE CHECK
Date Time Interrogation Session: 20210212151502
Implantable Pulse Generator Implant Date: 20190218

## 2019-07-14 NOTE — Progress Notes (Signed)
ILR remote 

## 2019-07-25 ENCOUNTER — Ambulatory Visit: Payer: Medicare Other | Admitting: Physician Assistant

## 2019-08-12 LAB — CUP PACEART REMOTE DEVICE CHECK
Date Time Interrogation Session: 20210314000500
Implantable Pulse Generator Implant Date: 20190218

## 2019-08-13 ENCOUNTER — Ambulatory Visit (INDEPENDENT_AMBULATORY_CARE_PROVIDER_SITE_OTHER): Payer: Medicare Other | Admitting: *Deleted

## 2019-08-13 DIAGNOSIS — I480 Paroxysmal atrial fibrillation: Secondary | ICD-10-CM

## 2019-08-14 LAB — CUP PACEART REMOTE DEVICE CHECK
Date Time Interrogation Session: 20210315163557
Implantable Pulse Generator Implant Date: 20190218

## 2019-08-14 NOTE — Progress Notes (Signed)
ILR Remote 

## 2019-08-19 NOTE — Progress Notes (Signed)
Cardiology Office Note Date:  08/19/2019  Patient ID:  Jessica Fowler, Jessica Fowler [birth year], MRN [id number] PCP:  Leone Haven, MD  Electrophysiologist: Dr. Rayann Heman    Chief Complaint:  6 month visit  History of Present Illness: Jessica Fowler is a 74 y.o. female with history of PAFib, AFlutter s/p AFlutter and PVI ablation 02/20/10 with Dr. Rayann Heman, HLD, comes in today to be seen for Dr. Rayann Heman.  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  She was seen by myself in Feb 2018, she had noticed in the previous couple weeks increasing palpitations, she described days that all day she feels like she is having on/off skipped/extra beats, eventually felt she had become aware of every heart beat she has.  No CP or SOB.  She was recovering from her Whipple surgery and had become increasingly more active, no dizziness, near syncope or syncope.  No changes were made at that visit with plans for holter monitoring which she had (48 hours) with only rare PACs and nonsustained AT.    She saw Dr. Rayann Heman in October 2018, he notes that she had been struggling with inability to gain weight post Whipple surgery, otherwise without much in the way of palpitations, did get winded with exertion and an echo was ordered.   Planned for PA/NP visits Q 6 months.  Her echo noted LVEF 36-14%, normal diastolic function, no significant VHD or findings.  I saw her in jan 2019 as an add-on visit with an increase in her palpitations over the last week.  She stated though after calling and getting the appointment, that they seemed to simmer down quite a bit.  She denied CP, reported chronic problems with GERD, but no CP.  She explained over the last year she will feel intermittent skipped/extra beats but in the last week this has ramped up.  She denied any particular changes personally this  week other then the holidays.  No new medicines.  She was to have started on a new medicine for her pancrease to take with meals/snacks but was unable to afford it and had not started it, she planned to f/u with her GI about this.  She reportd 2 different feeling palpitations.  One is brief fast beats "my heart goes crazy" these last a few minutes and make her feel a little winded.  She has another that feels like a double heart beat the second is very strong and then stops a second.  These are particularly anxiety provoking for her, can happen every 2-3 beats for a while then intermittently.  No dizziness, near syncope or syncope.   She stated me she is frankly aware of every heart beat she has.  We planned for f/u monitoring (not done).  She saw Dr. Rayann Heman in f/u and underwent ILR implant to better evaluate her symptoms, and monitor for recurrent AFib and potential need to reconsider a/c if recurrent AF found.  At her wound check visit, 20 minute duration of AF noted, by RN note, no change recommended by Dr. Rayann Heman given minimal amount to date.  Subsequent transmission  noted short AF episode. Planned to monitor without changes unless duration exceed 108minutes.  I saw her in clinic 11/2017 by myself, from a palpitations standpoint she was doing much better  she wasn't feel them like she had been earlier in the year.  She had no CP, no near syncope or syncope.  She mdentioned occasionally feeling the need to take in a deep breath like she needs more air.  No SOB otherwise, doing her work outside as usual.  She c/o feeling like she had much energy and is having a hard time keeping weight on.  She continues to f/u with GI, was never able to get started on the regime of meds for her pancreas 2/2 cost restrictions.  03/16/2019 she had a virtual visit with Dr. Johney Frame, was having some palpitations, these making her anxious, lasting about an hour. Given low risk score, not on a/c, noting low burden as well.  Dr.  Johney Frame discussed starting Flecainide vs a repeat PVI ablation, though she preferred to hold off, maintaining a more conservative approach.   I saw her 03/27/2019, she reported that Dr. Johney Frame did not tell her to cancel it and when they called her she thought best to keep it in place since she had been having some AFib lately. Since her virtual visit with Dr. Johney Frame she had not had any further episodes (by device interrogation or symptoms). She is aware of every heart beat she has, even in regular rhythm, and when she has AFib she gets very anxious and worried. The Afib on 10/7, eventually making her feel weak, lightheaded and near syncopal. Outside of her AFib though feeling well, no CP or SOB, no exertional intolerances.  No syncope We discussed Dr. Jenel Lucks thoughts to start Flecainide vs repeat ablation, though she did not want to pursue either.  Her Toprol increased to BID  TODAY She is doing well.  Less aware of her heart beat, and never has used her PRN metoprolol in the last 6 months.  She feels like her Afib is well controlled.  Much more tolerable, does not think any changes need to be made. She denies any CP.  She is very active on her property, no SOB or DOE.  No dizzy spells, near syncope or syncope.   Device information: MDT ILR implanted 07/18/17, to monitor post PVI ablation/palpitations   Past Medical History:  Diagnosis Date  . Allergic rhinitis   . Atrial fibrillation (HCC)    s/p ablation 02/21/10  . Hyperlipidemia   . Pneumonia   . S/P ablation of atrial flutter 02/21/2010    Past Surgical History:  Procedure Laterality Date  . CARDIAC CATHETERIZATION  04/18/2009   Normal coronary arteries, EF 60%, 1+ MR  . EP study  02/20/2010   CTI and PVI ablation by [facility]  . LOOP RECORDER INSERTION N/A 07/18/2017   Procedure: LOOP RECORDER INSERTION;  Surgeon: Hillis Range, MD;  Location: [facility];  Service: Cardiovascular;  Laterality: N/A;    Current Outpatient  Medications  Medication Sig Dispense Refill  . calcium-vitamin D (OSCAL 500/200 D-3) 500-200 MG-UNIT per tablet Take 1 tablet by mouth 3 (three) times a week.     . Cyanocobalamin (VITAMIN B-12 PO) Take 1 tablet by mouth daily.    . dorzolamide-timolol (COSOPT) 22.3-6.8 MG/ML ophthalmic solution Place 1 drop into both eyes 2 (two) times daily.    . fluticasone (FLONASE) 50 MCG/ACT nasal spray Place 2 sprays into both nostrils daily as needed for allergies.     Marland Kitchen latanoprost (XALATAN) 0.005 % ophthalmic solution Place 1 drop into both eyes at bedtime.     . metoprolol succinate (TOPROL XL) 25 MG 24 hr tablet Take 1 tablet (25 mg total) by mouth 2 (two) times daily.  180 tablet 3  . metoprolol tartrate (LOPRESSOR) 25 MG tablet Take 1 tablet (25 mg total) by mouth 2 (two) times daily as needed (PALPITATIONS). 90 tablet 3  . nitroGLYCERIN (NITROSTAT) 0.4 MG SL tablet Place 1 tablet (0.4 mg total) under the tongue every 5 (five) minutes as needed for chest pain (MAX 3 TABLETS). 30 tablet 11  . omeprazole (PRILOSEC) 40 MG capsule Take 40 mg by mouth daily as needed (for acid reflux).   5   No current facility-administered medications for this visit.    Allergies:   Pollen extract and Brimonidine tartrate   Social History:  The patient  reports that she has never smoked. She has never used smokeless tobacco. She reports that she does not drink alcohol or use drugs.   Family History:  The patient's family history includes Colon cancer in her father; Heart failure in her mother; Hyperthyroidism in her son; Hypothyroidism in her daughter.  ROS:  Please see the history of present illness.   All other systems are reviewed and otherwise negative.   PHYSICAL EXAM:  VS:  There were no vitals taken for this visit. BMI: There is no height or weight on file to calculate BMI. Well nourished, well developed, in no acute distress  HEENT: normocephalic, atraumatic  Neck: no JVD, carotid bruits or  masses Cardiac: RRR; no significant murmurs, no rubs, or gallops Lungs: CTA b/l no wheezing, rhonchi or rales  Abd: soft, nontender MS: no deformity, age appropriate atrophy Ext:  no edema  Skin: warm and dry, no rash Neuro:  No gross deficits appreciated Psych: euthymic mood, full affect  EKG:  Not done today  ILR interrogation done today and reviewed by myself:  Battery is good R waves 0.3 24 AF episodes 2 tachy Tachy episodes are within AFib episodes, and likely RVR, no change in morphology Burden remains low <0.1% Longest episode is   03/16/17: TTE Study Conclusions - Left ventricle: The cavity size was normal. Systolic function was   normal. The estimated ejection fraction was in the range of 55%   to 60%. Wall motion was normal; there were no regional wall   motion abnormalities. Left ventricular diastolic function   parameters were normal. - Aortic valve: Transvalvular velocity was within the normal range.   There was no stenosis. There was no regurgitation. - Mitral valve: Mild prolapse, involving the anterior leaflet.   Transvalvular velocity was within the normal range. There was no   evidence for stenosis. There was trivial regurgitation. - Right ventricle: The cavity size was normal. Wall thickness was   normal. Systolic function was normal. - Atrial septum: No defect or patent foramen ovale was identified. - Tricuspid valve: There was mild regurgitation. - Pulmonary arteries: Systolic pressure was within the normal   range. PA peak pressure: 23 mm Hg (S). - Pericardium, extracardiac: A trivial pericardial effusion was   identified.  01/08/16: Coronary CTangio IMPRESSION: 1. Coronary calcium score of 6. This was 26 percentile for age and sex matched control. 2. Normal coronary origin.  Right dominance. 3. 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 4.  No incidental findings were seen.  12/29/15: Exercise stress myoview,  reached target HR  Nuclear stress EF: 64%.  Blood pressure demonstrated a normal response to exercise.  Horizontal ST segment depression ST segment depression of 1 mm was noted during stress in the II, III, aVF, V5 and V6 leads, beginning at 4 minutes of stress, and returning  to baseline after 5-9 minutes of recovery.  This is an intermediate risk study.  The left ventricular ejection fraction is normal (55-65%).  1. No evidence for ischemia or infarction on perfusion imaging (normal).  2. There was ST depression lasting about 7 minutes into recovery in inferior leads + leads V5/V6 accompanied by chest pain during recovery (dyspnea limited exercise).  3. Normal LV systolic function.  4. Discrepant findings.  Given angina and abnormal ETT, would rate an intermediate risk study.  However, perfusion images were normal.  Would consider further investigation, ?coronary CT angiogram  12/29/15: Echocardiogram Study Conclusions - Left ventricle: The cavity size was normal. Systolic function was   normal. The estimated ejection fraction was in the range of 55%   to 60%. Wall motion was normal; there were no regional wall   motion abnormalities. Left ventricular diastolic function   parameters were normal.  No significant VHD described  Recent Labs: No results found for requested labs within last 8760 hours.  No results found for requested labs within last 8760 hours.   CrCl cannot be calculated (Patient's most recent lab result is older than the maximum 21 days allowed.).   Wt Readings from Last 3 Encounters:  03/27/19 133 lb (60.3 kg)  03/16/19 130 lb (59 kg)  12/09/17 124 lb (56.2 kg)     Other studies reviewed: Additional studies/records reviewed today include: summarized above  ASSESSMENT AND PLAN:  1. Paroxysmal afib     H/o PVI ablation with Dr. Johney Frame in 01/2010     CHA2DS2Vasc is 1 for age, 2 with gender, off a/c     <0.1% burden     Disposition: No changes, continue  monthly remotes, see her back in 6 mo, sooner if needed  Current medicines are reviewed at length with the patient today.  The patient did not have any concerns regarding medicines.  Judith Blonder, PA-C 08/19/2019 3:57 PM     [facility] [address] [place] [address] [phone] (office)  [phone] (fax)

## 2019-08-20 ENCOUNTER — Ambulatory Visit (INDEPENDENT_AMBULATORY_CARE_PROVIDER_SITE_OTHER): Payer: Medicare Other | Admitting: Physician Assistant

## 2019-08-20 ENCOUNTER — Other Ambulatory Visit: Payer: Self-pay

## 2019-08-20 VITALS — BP 112/62 | HR 60 | Ht 65.0 in | Wt 136.0 lb

## 2019-08-20 DIAGNOSIS — I480 Paroxysmal atrial fibrillation: Secondary | ICD-10-CM

## 2019-08-20 DIAGNOSIS — Z4509 Encounter for adjustment and management of other cardiac device: Secondary | ICD-10-CM | POA: Diagnosis not present

## 2019-08-20 NOTE — Patient Instructions (Signed)
Medication Instructions:   Your physician recommends that you continue on your current medications as directed. Please refer to the Current Medication list given to you today.  *If you need a refill on your cardiac medications before your next appointment, please call your pharmacy*   Lab Work: NONE ORDERED  TODAY   If you have labs (blood work) drawn today and your tests are completely normal, you will receive your results only by: Marland Kitchen MyChart Message (if you have MyChart) OR . A paper copy in the mail If you have any lab test that is abnormal or we need to change your treatment, we will call you to review the results.   Testing/Procedures: NONE ORDERED  TODAY   Follow-Up: At [facility], you and your health needs are our priority.  As part of our continuing mission to provide you with exceptional heart care, we have created designated Provider Care Teams.  These Care Teams include your primary Cardiologist (physician) and Advanced Practice Providers (APPs -  Physician Assistants and Nurse Practitioners) who all work together to provide you with the care you need, when you need it.  We recommend signing up for the patient portal called "MyChart".  Sign up information is provided on this After Visit Summary.  MyChart is used to connect with patients for Virtual Visits (Telemedicine).  Patients are able to view lab/test results, encounter notes, upcoming appointments, etc.  Non-urgent messages can be sent to your provider as well.   To learn more about what you can do with MyChart, go to [email or web address].    Your next appointment:   6 month(s)  The format for your next appointment:   In Person  Provider:   You may see Keitha Butte  or one of the following Advanced Practice Providers on your designated Care Team:    Gypsy Balsam, NP  Francis Dowse, PA-C  Casimiro Needle "Mardelle Matte" Lanna Poche, [place]    Other Instructions

## 2019-09-13 ENCOUNTER — Ambulatory Visit (INDEPENDENT_AMBULATORY_CARE_PROVIDER_SITE_OTHER): Payer: Medicare Other | Admitting: *Deleted

## 2019-09-13 DIAGNOSIS — I480 Paroxysmal atrial fibrillation: Secondary | ICD-10-CM | POA: Diagnosis not present

## 2019-09-13 LAB — CUP PACEART REMOTE DEVICE CHECK
Date Time Interrogation Session: 20210415163802
Implantable Pulse Generator Implant Date: 20190218

## 2019-09-14 NOTE — Progress Notes (Signed)
ILR Remote 

## 2019-10-15 ENCOUNTER — Ambulatory Visit (INDEPENDENT_AMBULATORY_CARE_PROVIDER_SITE_OTHER): Payer: Medicare Other | Admitting: *Deleted

## 2019-10-15 DIAGNOSIS — I480 Paroxysmal atrial fibrillation: Secondary | ICD-10-CM | POA: Diagnosis not present

## 2019-10-15 LAB — CUP PACEART REMOTE DEVICE CHECK
Date Time Interrogation Session: 20210516164410
Implantable Pulse Generator Implant Date: 20190218

## 2019-10-15 NOTE — Progress Notes (Signed)
Carelink Summary Report / Loop Recorder 

## 2019-11-19 ENCOUNTER — Ambulatory Visit (INDEPENDENT_AMBULATORY_CARE_PROVIDER_SITE_OTHER): Payer: Medicare Other | Admitting: *Deleted

## 2019-11-19 DIAGNOSIS — I480 Paroxysmal atrial fibrillation: Secondary | ICD-10-CM | POA: Diagnosis not present

## 2019-11-19 LAB — CUP PACEART REMOTE DEVICE CHECK
Date Time Interrogation Session: 20210621004152
Implantable Pulse Generator Implant Date: 20190218

## 2019-11-19 NOTE — Progress Notes (Signed)
Carelink Summary Report / Loop Recorder 

## 2019-12-24 ENCOUNTER — Ambulatory Visit (INDEPENDENT_AMBULATORY_CARE_PROVIDER_SITE_OTHER): Payer: Medicare Other | Admitting: *Deleted

## 2019-12-24 DIAGNOSIS — I480 Paroxysmal atrial fibrillation: Secondary | ICD-10-CM

## 2019-12-24 LAB — CUP PACEART REMOTE DEVICE CHECK
Date Time Interrogation Session: 20210725232853
Implantable Pulse Generator Implant Date: 20190218

## 2019-12-25 NOTE — Progress Notes (Signed)
Carelink Summary Report / Loop Recorder 

## 2020-01-27 LAB — CUP PACEART REMOTE DEVICE CHECK
Date Time Interrogation Session: 20210827233848
Implantable Pulse Generator Implant Date: 20190218

## 2020-01-28 ENCOUNTER — Ambulatory Visit (INDEPENDENT_AMBULATORY_CARE_PROVIDER_SITE_OTHER): Payer: Medicare Other | Admitting: *Deleted

## 2020-01-28 DIAGNOSIS — I480 Paroxysmal atrial fibrillation: Secondary | ICD-10-CM | POA: Diagnosis not present

## 2020-01-29 NOTE — Progress Notes (Signed)
Carelink Summary Report / Loop Recorder 

## 2020-02-18 NOTE — Progress Notes (Signed)
Cardiology Office Note Date:  02/18/2020  Patient ID:  Jessica Fowler, Jessica Fowler 11-Apr-1946, MRN [id number] PCP:  [name], MD  Electrophysiologist: Dr. Johney Frame    Chief Complaint:  6 month visit  History of Present Illness: Jessica Fowler is a 74 y.o. female with history of PAFib, AFlutter s/p AFlutter and PVI ablation 02/20/10 with Dr. Johney Frame, HLD, comes in today to be seen for Dr. Johney Frame.  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  She was seen by myself in Feb 2018, she had noticed in the previous couple weeks increasing palpitations, she described days that all day she feels like she is having on/off skipped/extra beats, eventually felt she had become aware of every heart beat she has.  No CP or SOB.  She was recovering from her Whipple surgery and had become increasingly more active, no dizziness, near syncope or syncope.  No changes were made at that visit with plans for holter monitoring which she had (48 hours) with only rare PACs and nonsustained AT.    She saw Dr. Johney Frame in October 2018, he notes that she had been struggling with inability to gain weight post Whipple surgery, otherwise without much in the way of palpitations, did get winded with exertion and an echo was ordered.   Planned for PA/NP visits Q 6 months.  Her echo noted LVEF 55-60%, normal diastolic function, no significant VHD or findings.  I saw her in jan 2019 as an add-on visit with an increase in her palpitations over the last week.  She stated though after calling and getting the appointment, that they seemed to simmer down quite a bit.  She denied CP, reported chronic problems with GERD, but no CP.  She explained over the last year she will feel intermittent skipped/extra beats but in the last week this has ramped up.  She denied any particular changes personally this  week other then the holidays.  No new medicines.  She was to have started on a new medicine for her pancrease to take with meals/snacks but was unable to afford it and had not started it, she planned to f/u with her GI about this.  She reportd 2 different feeling palpitations.  One is brief fast beats "my heart goes crazy" these last a few minutes and make her feel a little winded.  She has another that feels like a double heart beat the second is very strong and then stops a second.  These are particularly anxiety provoking for her, can happen every 2-3 beats for a while then intermittently.  No dizziness, near syncope or syncope.   She stated me she is frankly aware of every heart beat she has.  We planned for f/u monitoring (not done).  She saw Dr. Johney Frame in f/u and underwent ILR implant to better evaluate her symptoms, and monitor for recurrent AFib and potential need to reconsider a/c if recurrent AF found.  At her wound check visit, 20 minute duration of AF noted, by RN note, no change recommended by Dr. Johney Frame given minimal amount to date.  Subsequent transmission  noted short AF episode. Planned to monitor without changes unless duration exceed .  I saw her in clinic 11/2017 by myself, from a palpitations standpoint she was doing much better  she wasn't feel them like she had been earlier in the year.  She had no CP, no near syncope or syncope.  She mdentioned occasionally feeling the need to take in a deep breath like she needs more air.  No SOB otherwise, doing her work outside as usual.  She c/o feeling like she had much energy and is having a hard time keeping weight on.  She continues to f/u with GI, was never able to get started on the regime of meds for her pancreas 2/2 cost restrictions.  03/16/2019 she had a virtual visit with Dr. Johney FrameAllred, was having some palpitations, these making her anxious, lasting about an hour. Given low risk score, not on a/c, noting low burden as well.  Dr.  Johney FrameAllred discussed starting Flecainide vs a repeat PVI ablation, though she preferred to hold off, maintaining a more conservative approach.   I saw her 03/27/2019, she reported that Dr. Johney FrameAllred did not tell her to cancel it and when they called her she thought best to keep it in place since she had been having some AFib lately. Since her virtual visit with Dr. Johney FrameAllred she had not had any further episodes (by device interrogation or symptoms). She is aware of every heart beat she has, even in regular rhythm, and when she has AFib she gets very anxious and worried. The Afib on 10/7, eventually making her feel weak, lightheaded and near syncopal. Outside of her AFib though feeling well, no CP or SOB, no exertional intolerances.  No syncope We discussed Dr. Jenel LucksAllred's thoughts to start Flecainide vs repeat ablation, though she did not want to pursue either.  Her Toprol increased to BID  I saw her 08/20/19 She is doing well.  Less aware of her heart beat, and never has used her PRN metoprolol in the last 6 months.  She feels like her Afib is well controlled.  Much more tolerable, does not think any changes need to be made. She denies any CP.  She is very active on her property, no SOB or DOE.  No dizzy spells, near syncope or syncope. Burden remained low ,0.1% Longest AFib episode was 8 min No changes were made, planned for [id number] visit   TODAY She has had more awareness of her Afib of late.  Once about 3 weeks ago longer lasting and made her lightheaded, and scared her, she took a "fast acting pill" and it simmered down.  Otherwise she had had awareness but not overly symptomatic with her palpitations. She self adjusted her metoprolol from succinate BID and tartrate PRN only to tartrate in the Am and succ in the evening to see if this would help.  No CP, no SOB, no syncope  She remains very active with no exertional intolerances.   Device information: MDT ILR implanted 07/18/17, to monitor post PVI  ablation/palpitations   Past Medical History:  Diagnosis Date  . Allergic rhinitis   . Atrial fibrillation (HCC)    s/p ablation 02/21/10  . Hyperlipidemia   . Pneumonia   . S/P ablation of atrial flutter 02/21/2010    Past Surgical History:  Procedure Laterality Date  . CARDIAC CATHETERIZATION  04/18/2009   Normal coronary arteries, EF 60%, 1+ MR  . EP study  02/20/2010   CTI and PVI ablation by JA  . LOOP RECORDER INSERTION N/A 07/18/2017   Procedure: LOOP RECORDER INSERTION;  Surgeon: Hillis RangeAllred, James, MD;  Location: [facility];  Service: Cardiovascular;  Laterality: N/A;    Current Outpatient Medications  Medication Sig Dispense Refill  . calcium-vitamin D (OSCAL 500/200  D-3) 500-200 MG-UNIT per tablet Take 1 tablet by mouth 3 (three) times a week.     . Cyanocobalamin (VITAMIN B-12 PO) Take 1 tablet by mouth daily.    . dorzolamide-timolol (COSOPT) 22.3-6.8 MG/ML ophthalmic solution Place 1 drop into both eyes 2 (two) times daily.    . fluticasone (FLONASE) 50 MCG/ACT nasal spray Place 2 sprays into both nostrils daily as needed for allergies.     Marland Kitchen latanoprost (XALATAN) 0.005 % ophthalmic solution Place 1 drop into both eyes at bedtime.     . metoprolol succinate (TOPROL XL) 25 MG 24 hr tablet Take 1 tablet (25 mg total) by mouth 2 (two) times daily. 180 tablet 3  . metoprolol tartrate (LOPRESSOR) 25 MG tablet Take 1 tablet (25 mg total) by mouth 2 (two) times daily as needed (PALPITATIONS). 90 tablet 3  . nitroGLYCERIN (NITROSTAT) 0.4 MG SL tablet Place 1 tablet (0.4 mg total) under the tongue every 5 (five) minutes as needed for chest pain (MAX 3 TABLETS). 30 tablet 11  . omeprazole (PRILOSEC) 40 MG capsule Take 40 mg by mouth daily as needed (for acid reflux).   5   No current facility-administered medications for this visit.    Allergies:   Pollen extract and Brimonidine tartrate   Social History:  The patient  reports that she has never smoked. She has never used  smokeless tobacco. She reports that she does not drink alcohol and does not use drugs.   Family History:  The patient's family history includes Colon cancer in her father; Heart failure in her mother; Hyperthyroidism in her son; Hypothyroidism in her daughter.  ROS:  Please see the history of present illness.   All other systems are reviewed and otherwise negative.   PHYSICAL EXAM:  VS:  There were no vitals taken for this visit. BMI: There is no height or weight on file to calculate BMI. Well nourished, well developed, in no acute distress  HEENT: normocephalic, atraumatic  Neck: no JVD, carotid bruits or masses Cardiac: RRR; no significant murmurs, no rubs, or gallops Lungs: CTA b/l no wheezing, rhonchi or rales  Abd: soft, nontender MS: no deformity, age appropriate atrophy Ext:   no edema  Skin: warm and dry, no rash Neuro:  No gross deficits appreciated Psych: euthymic mood, full affect  ILR site: no erythema, edema, tenderness   EKG:  Done today and reviewed by myself SB  52bpm  ILR interrogation done today and reviewed by myself:  Battery is good R waves 0.103mV Overall burden remains low <1%  Some of the AF episodes perhaps are PACs, others look like true Afib Longest , avg V rate140 with this One tachy episode looks rapid Af (41sec) Frequency and duration of her AF appear fairly stable  03/16/17: TTE Study Conclusions - Left ventricle: The cavity size was normal. Systolic function was   normal. The estimated ejection fraction was in the range of 55%   to 60%. Wall motion was normal; there were no regional wall   motion abnormalities. Left ventricular diastolic function   parameters were normal. - Aortic valve: Transvalvular velocity was within the normal range.   There was no stenosis. There was no regurgitation. - Mitral valve: Mild prolapse, involving the anterior leaflet.   Transvalvular velocity was within the normal range. There was no   evidence for  stenosis. There was trivial regurgitation. - Right ventricle: The cavity size was normal. Wall thickness was   normal. Systolic function was normal. -  Atrial septum: No defect or patent foramen ovale was identified. - Tricuspid valve: There was mild regurgitation. - Pulmonary arteries: Systolic pressure was within the normal   range. PA peak pressure: 23 mm Hg (S). - Pericardium, extracardiac: A trivial pericardial effusion was   identified.  01/08/16: Coronary CTangio IMPRESSION: 1. Coronary calcium score of 6. This was 26 percentile for age and sex matched control. 2. Normal coronary origin.  Right dominance. 3. 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 4.  No incidental findings were seen.  12/29/15: Exercise stress myoview, reached target HR  Nuclear stress EF: 64%.  Blood pressure demonstrated a normal response to exercise.  Horizontal ST segment depression ST segment depression of 1 mm was noted during stress in the II, III, aVF, V5 and V6 leads, beginning at 4 minutes of stress, and returning to baseline after 5-9 minutes of recovery.  This is an intermediate risk study.  The left ventricular ejection fraction is normal (55-65%).  1. No evidence for ischemia or infarction on perfusion imaging (normal).  2. There was ST depression lasting about 7 minutes into recovery in inferior leads + leads V5/V6 accompanied by chest pain during recovery (dyspnea limited exercise).  3. Normal LV systolic function.  4. Discrepant findings.  Given angina and abnormal ETT, would rate an intermediate risk study.  However, perfusion images were normal.  Would consider further investigation, ?coronary CT angiogram  12/29/15: Echocardiogram Study Conclusions - Left ventricle: The cavity size was normal. Systolic function was   normal. The estimated ejection fraction was in the range of 55%   to 60%. Wall motion was normal; there were no regional wall   motion  abnormalities. Left ventricular diastolic function   parameters were normal.  No significant VHD described  Recent Labs: No results found for requested labs within last 8760 hours.  No results found for requested labs within last 8760 hours.   CrCl cannot be calculated (Patient's most recent lab result is older than the maximum 21 days allowed.).   Wt Readings from Last 3 Encounters:  08/20/19 136 lb (61.7 kg)  03/27/19 133 lb (60.3 kg)  03/16/19 130 lb (59 kg)     Other studies reviewed: Additional studies/records reviewed today include: summarized above  ASSESSMENT AND PLAN:  1. Paroxysmal afib     H/o PVI ablation with Dr. Johney Frame in 01/2010     CHA2DS2Vasc is 1 for age, 2 with gender, off a/c      <0.1% burden  Revisited consideration of an AAD or perhaps repeat ablation, though again she is reluctant about either of these. Recommend she resume metoprolol succ 25mg  BID for a more event coverage of medicine through the day and use the tartrate PRN. She is agreeable to this Over all her burden, frequency and duration of her Af appears about the same as it has been for the last year. She has SB 52 here today, some reluctance to increase dose of her Toprol. HR histograms look good She care for her property as well as her brother's and enjoys being active and outside  Will have her see Dr. in a couple months and revisit her burden, symptoms, treatment strategies. She will be 75 next year, otherwise no other changes to her risk score.      Current medicines are reviewed at length with the patient today.  The patient did not have any concerns regarding medicines.  Johney Frame, PA-C 02/18/2020 12:15 PM  [address]  [place] [address] [phone] (office)  [phone] (fax)

## 2020-02-20 ENCOUNTER — Other Ambulatory Visit: Payer: Self-pay

## 2020-02-20 ENCOUNTER — Ambulatory Visit (INDEPENDENT_AMBULATORY_CARE_PROVIDER_SITE_OTHER): Payer: Medicare Other | Admitting: Physician Assistant

## 2020-02-20 VITALS — BP 110/56 | HR 52 | Ht 65.5 in | Wt 138.0 lb

## 2020-02-20 DIAGNOSIS — Z4509 Encounter for adjustment and management of other cardiac device: Secondary | ICD-10-CM | POA: Diagnosis not present

## 2020-02-20 DIAGNOSIS — I480 Paroxysmal atrial fibrillation: Secondary | ICD-10-CM | POA: Diagnosis not present

## 2020-02-20 NOTE — Patient Instructions (Signed)
Medication Instructions:  Your physician recommends that you continue on your current medications as directed. Please refer to the Current Medication list given to you today.  *If you need a refill on your cardiac medications before your next appointment, please call your pharmacy*   Lab Work: NONE ORDERED  TODAY   If you have labs (blood work) drawn today and your tests are completely normal, you will receive your results only by: Marland Kitchen MyChart Message (if you have MyChart) OR . A paper copy in the mail If you have any lab test that is abnormal or we need to change your treatment, we will call you to review the results.   Testing/Procedures: NONE ORDERED  TODAY   Follow-Up: At [facility], you and your health needs are our priority.  As part of our continuing mission to provide you with exceptional heart care, we have created designated Provider Care Teams.  These Care Teams include your primary Cardiologist (physician) and Advanced Practice Providers (APPs -  Physician Assistants and Nurse Practitioners) who all work together to provide you with the care you need, when you need it.  We recommend signing up for the patient portal called "MyChart".  Sign up information is provided on this After Visit Summary.  MyChart is used to connect with patients for Virtual Visits (Telemedicine).  Patients are able to view lab/test results, encounter notes, upcoming appointments, etc.  Non-urgent messages can be sent to your provider as well.   To learn more about what you can do with MyChart, go to [email or web address].    Your next appointment:   2 month(s)  The format for your next appointment:   In Person  Provider:   You may see  Dr. Johney Frame   Other Instructions

## 2020-02-28 LAB — CUP PACEART REMOTE DEVICE CHECK
Date Time Interrogation Session: 20210929233656
Implantable Pulse Generator Implant Date: 20190218

## 2020-03-03 ENCOUNTER — Ambulatory Visit (INDEPENDENT_AMBULATORY_CARE_PROVIDER_SITE_OTHER): Payer: Medicare Other

## 2020-03-03 DIAGNOSIS — I480 Paroxysmal atrial fibrillation: Secondary | ICD-10-CM | POA: Diagnosis not present

## 2020-03-04 NOTE — Progress Notes (Signed)
Carelink Summary Report / Loop Recorder 

## 2020-03-22 ENCOUNTER — Other Ambulatory Visit: Payer: Self-pay | Admitting: Physician Assistant

## 2020-04-02 LAB — CUP PACEART REMOTE DEVICE CHECK
Date Time Interrogation Session: 20211101233732
Implantable Pulse Generator Implant Date: 20190218

## 2020-04-07 ENCOUNTER — Ambulatory Visit (INDEPENDENT_AMBULATORY_CARE_PROVIDER_SITE_OTHER): Payer: Medicare Other

## 2020-04-07 DIAGNOSIS — I480 Paroxysmal atrial fibrillation: Secondary | ICD-10-CM | POA: Diagnosis not present

## 2020-04-07 NOTE — Progress Notes (Signed)
Carelink Summary Report / Loop Recorder 

## 2020-04-15 ENCOUNTER — Other Ambulatory Visit: Payer: Self-pay | Admitting: Physician Assistant

## 2020-04-21 ENCOUNTER — Encounter: Payer: Medicare Other | Admitting: Internal Medicine

## 2020-05-10 LAB — CUP PACEART REMOTE DEVICE CHECK
Date Time Interrogation Session: 20211204224241
Implantable Pulse Generator Implant Date: 20190218

## 2020-05-12 ENCOUNTER — Ambulatory Visit (INDEPENDENT_AMBULATORY_CARE_PROVIDER_SITE_OTHER): Payer: Medicare Other

## 2020-05-12 DIAGNOSIS — I480 Paroxysmal atrial fibrillation: Secondary | ICD-10-CM | POA: Diagnosis not present

## 2020-05-19 ENCOUNTER — Telehealth: Payer: Self-pay

## 2020-05-19 NOTE — Telephone Encounter (Signed)
Error

## 2020-05-26 NOTE — Progress Notes (Signed)
Carelink Summary Report / Loop Recorder 

## 2020-06-16 ENCOUNTER — Ambulatory Visit (INDEPENDENT_AMBULATORY_CARE_PROVIDER_SITE_OTHER): Payer: Medicare Other

## 2020-06-16 DIAGNOSIS — I480 Paroxysmal atrial fibrillation: Secondary | ICD-10-CM

## 2020-06-17 LAB — CUP PACEART REMOTE DEVICE CHECK
Date Time Interrogation Session: 20220115235548
Implantable Pulse Generator Implant Date: 20190218

## 2020-06-28 NOTE — Progress Notes (Signed)
Carelink Summary Report / Loop Recorder 

## 2020-07-21 ENCOUNTER — Ambulatory Visit (INDEPENDENT_AMBULATORY_CARE_PROVIDER_SITE_OTHER): Payer: Medicare Other

## 2020-07-21 DIAGNOSIS — I480 Paroxysmal atrial fibrillation: Secondary | ICD-10-CM | POA: Diagnosis not present

## 2020-07-21 LAB — CUP PACEART REMOTE DEVICE CHECK
Date Time Interrogation Session: 20220217235847
Implantable Pulse Generator Implant Date: 20190218

## 2020-07-25 NOTE — Progress Notes (Signed)
Carelink Summary Report / Loop Recorder 

## 2020-08-23 LAB — CUP PACEART REMOTE DEVICE CHECK
Date Time Interrogation Session: 20220323005958
Implantable Pulse Generator Implant Date: 20190218

## 2020-08-25 ENCOUNTER — Ambulatory Visit (INDEPENDENT_AMBULATORY_CARE_PROVIDER_SITE_OTHER): Payer: Medicare Other

## 2020-08-25 DIAGNOSIS — I480 Paroxysmal atrial fibrillation: Secondary | ICD-10-CM | POA: Diagnosis not present

## 2020-09-04 NOTE — Progress Notes (Signed)
Carelink Summary Report / Loop Recorder 

## 2020-09-22 ENCOUNTER — Ambulatory Visit (INDEPENDENT_AMBULATORY_CARE_PROVIDER_SITE_OTHER): Payer: Medicare Other

## 2020-09-22 DIAGNOSIS — I480 Paroxysmal atrial fibrillation: Secondary | ICD-10-CM

## 2020-09-24 LAB — CUP PACEART REMOTE DEVICE CHECK
Date Time Interrogation Session: 20220425010123
Implantable Pulse Generator Implant Date: 20190218

## 2020-10-08 ENCOUNTER — Telehealth: Payer: Self-pay | Admitting: Emergency Medicine

## 2020-10-08 ENCOUNTER — Telehealth: Payer: Self-pay

## 2020-10-08 ENCOUNTER — Other Ambulatory Visit: Payer: Self-pay | Admitting: Internal Medicine

## 2020-10-08 NOTE — Telephone Encounter (Signed)
Successful telephone encounter to Jessica Fowler to follow up on [facility] alert for increased Afib burden from 0.1% to 0.5%, most recent episode recorded 10/06/20 for 28 min. No OAC. EP notes reviewed. Per Francis Dowse, PA, patient was to follow up with Dr. Johney Frame in a couple of months from 02/18/21 to discuss increased symptoms with longer episode duration.  Per patient, she was very much aware of the 10/06/20 episode. States it lasted approx 3-4 hours instead of recorded 28 min. Described symptoms of palpitations, mild shortness of breath, mild chest pressure with "some dizziness". States she was "scared" and contemplated calling 911. Two hours into her symptoms she took an extra lopressor and symptoms resolved within two hours after additional medication. She has not had an episode since.  Reviewed with patient s/s of cardiac emergency and when to call 911. Encouraged patient to take additional dose of BB closer to onset of palpitations/tachycardia. Will send to scheduling for in-clinic appt with Dr. Johney Frame to discuss patient concerns for increased symptoms.

## 2020-10-08 NOTE — Telephone Encounter (Signed)
Patient unable to send transmission and error code was received. CMA Kem had patient on the phone trying to assist with transmission. During the process patient stated that she felt like she was going to pass out and was having increased shortness of breath. Patient immediately told to dial 911 per RN instructions and go to the nearest hospital due to symptoms. Unable to get a transmission from patient. Patient stated that she was immediately going to dial 911. See previous encounter notes from 10/08/20.

## 2020-10-08 NOTE — Telephone Encounter (Signed)
Patient did not call 911 as directed. Patient states she is back in rhythm and not having symptoms at this time.  I advised patient that someone from scheduling would be contacting her to set up an appointment with Dr. Johney Frame. Patient unable to send a transmission at this time due to an error code. Evans Lance is ordering patient a new hand held monitor for patient on 10/08/2020. I advised patient that if she should have any shortness of breath, chest pain , dizziness, or in general not feeling well to go to the ED in an emergent situation. She was also advised that we have no way of getting a transmission if her hand held monitor is showing an error code. I advised patient that when her new hand held arrives to contact the Device [facility] and we will assist her in sending a new transmission.

## 2020-10-08 NOTE — Telephone Encounter (Signed)
New hand held monitor ordered. Patient will call when it arrives.

## 2020-10-08 NOTE — Telephone Encounter (Signed)
The patient called back stating she is back in rhythm. She did not call 911. I let her speak with Shanda Bumps, rn.

## 2020-10-08 NOTE — Telephone Encounter (Signed)
The patient called back to let us know she is back out of rhythm. I tried to get a transmission but received error code 34. As I was helping her she stated she felt like she was going to pass out. Then she states it felt like her heart was beating faster. I asked her was she having chest pains and she denies chest pains. She also states she was not getting enough oxygen. Per Shanda Bumps, rn who was listening to the conversation, told me to tell the patient to call 911. I told the patient to call 911 and get to the nearest hospital. She lives in [place]. She agreed to call 911 and go to the ER.

## 2020-10-09 NOTE — Progress Notes (Signed)
Carelink Summary Report / Loop Recorder 

## 2020-10-14 NOTE — Telephone Encounter (Signed)
Handheld ordered. It will take 7-10 business days.

## 2020-10-27 LAB — CUP PACEART REMOTE DEVICE CHECK
Date Time Interrogation Session: 20220528011121
Implantable Pulse Generator Implant Date: 20190218

## 2020-10-28 ENCOUNTER — Ambulatory Visit (INDEPENDENT_AMBULATORY_CARE_PROVIDER_SITE_OTHER): Payer: Medicare Other

## 2020-10-28 DIAGNOSIS — I480 Paroxysmal atrial fibrillation: Secondary | ICD-10-CM

## 2020-10-29 NOTE — Telephone Encounter (Signed)
LMOVM

## 2020-10-30 ENCOUNTER — Telehealth: Payer: Self-pay

## 2020-10-30 NOTE — Telephone Encounter (Signed)
Spoke with Jessica Fowler and advised her that her transmission was received on 10/30/20. Advised patient that on may 30th she had some elevated heart rates. Patient states that she felt fine and is not symptomatic today. I advised patient if she had any questions or concerns to contact the Device Clinic. Presenting rhythm on 10/30/20 appears normal.

## 2020-10-30 NOTE — Telephone Encounter (Signed)
The patient let me know she did receive her new handheld. I helped her send a transmission with it. I also let her speak with Shanda Bumps, rn.

## 2020-10-31 ENCOUNTER — Other Ambulatory Visit: Payer: Self-pay | Admitting: Internal Medicine

## 2020-10-31 LAB — CUP PACEART REMOTE DEVICE CHECK
Date Time Interrogation Session: 20220602150041
Implantable Pulse Generator Implant Date: 20190218

## 2020-11-07 ENCOUNTER — Ambulatory Visit (INDEPENDENT_AMBULATORY_CARE_PROVIDER_SITE_OTHER): Payer: Medicare Other | Admitting: Internal Medicine

## 2020-11-07 ENCOUNTER — Other Ambulatory Visit: Payer: Self-pay

## 2020-11-07 VITALS — BP 124/60 | HR 64 | Ht 65.5 in | Wt 140.8 lb

## 2020-11-07 DIAGNOSIS — I4891 Unspecified atrial fibrillation: Secondary | ICD-10-CM | POA: Diagnosis not present

## 2020-11-07 NOTE — Progress Notes (Signed)
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 female who presents today for routine electrophysiology followup.  Since last being seen in our clinic, the patient reports doing reasonably well.  + frequent afib with palpitations and fatigue.  + associated anxiety.  + recent dizziness for which she was seen in ED and told that she was dehydrated  Today, she denies symptoms of chest pain, shortness of breath,  lower extremity edema,  or syncope.  The patient is otherwise without complaint today.   Past Medical History:  Diagnosis Date   Allergic rhinitis    Atrial fibrillation (HCC)    s/p ablation 02/21/10   Hyperlipidemia    Pneumonia    S/P ablation of atrial flutter 02/21/2010   Past Surgical History:  Procedure Laterality Date   CARDIAC CATHETERIZATION  04/18/2009   Normal coronary arteries, EF 60%, 1+ MR   EP study  02/20/2010   CTI and PVI ablation by JA   LOOP RECORDER INSERTION N/A 07/18/2017   Procedure: LOOP RECORDER INSERTION;  Surgeon: Hillis Range, MD;  Location: [facility];  Service: Cardiovascular;  Laterality: N/A;    ROS- all systems are reviewed and negatives except as per HPI above  Current Outpatient Medications  Medication Sig Dispense Refill   calcium-vitamin D (OSCAL WITH D) 500-200 MG-UNIT tablet Take 1 tablet by mouth 3 (three) times a week.      Cyanocobalamin (VITAMIN B-12 PO) Take 1 tablet by mouth daily.     dorzolamide-timolol (COSOPT) 22.3-6.8 MG/ML ophthalmic solution Place 1 drop into both eyes 2 (two) times daily.     fluticasone (FLONASE) 50 MCG/ACT nasal spray Place 2 sprays into both nostrils daily as needed for allergies.      latanoprost (XALATAN) 0.005 % ophthalmic solution Place 1 drop into both eyes at bedtime.      meclizine (ANTIVERT) 25 MG tablet Take 1 tablet by mouth as needed for dizziness.     metoprolol succinate (TOPROL-XL) 25 MG 24 hr tablet TAKE 1 TABLET BY MOUTH TWICE A DAY 180 tablet 3    metoprolol tartrate (LOPRESSOR) 25 MG tablet TAKE 1 TABLET 2 TIMES DAILY AS NEEDED (PALPITATIONS). PT NEEDS TO MAKE APPT 60 tablet 0   nitroGLYCERIN (NITROSTAT) 0.4 MG SL tablet Place 1 tablet (0.4 mg total) under the tongue every 5 (five) minutes as needed for chest pain (MAX 3 TABLETS). 30 tablet 11   omeprazole (PRILOSEC) 40 MG capsule Take 40 mg by mouth daily as needed (for acid reflux).   5   ondansetron (ZOFRAN-ODT) 4 MG disintegrating tablet Take 4 mg by mouth every 8 (eight) hours as needed for nausea.     No current facility-administered medications for this visit.    Physical Exam: Vitals:   11/07/20 1519  BP: 124/60  Pulse: 64  SpO2: 93%  Weight: 140 lb 12.8 oz (63.9 kg)  Height: 5' 5.5" (1.664 m)    GEN- The patient is well appearing, alert and oriented x 3 today.   Head- normocephalic, atraumatic Eyes-  Sclera clear, conjunctiva pink Ears- hearing intact Oropharynx- clear Lungs- Clear to ausculation bilaterally, normal work of breathing Heart- Regular rate and rhythm, no murmurs, rubs or gallops, PMI not laterally displaced GI- soft, NT, ND, + BS Extremities- no clubbing, cyanosis, or edema  Wt Readings from Last 3 Encounters:  11/07/20 140 lb 12.8 oz (63.9 kg)  02/20/20 138 lb (62.6 kg)  08/20/19 136 lb (  61.7 kg)    EKG tracing ordered today is personally reviewed and shows sinus  Assessment and Plan:  Paroxysmal atrial fibrillation She has frequent short episodes of afib S/p prior ablation in 2011 Chads2vasc score is now 3.  Given low burden, she declines OAC at this time. I have advised repeat ablation.  She will consider this but is currently not ready to proceed.  She does not wish to start [facility] or Ic AAD either.  2. Dizziness Recently seen in ED and told that she was dehydrated Pause and brady detections have been turned on today on her ILR  Follow-up in AF clinic in 3 months  Hillis Range MD, [facility] 11/07/2020 3:38 PM

## 2020-11-07 NOTE — Patient Instructions (Signed)
Medication Instructions:  Continue all current medications.  Labwork: none  Testing/Procedures: Your physician has requested that you have an echocardiogram. Echocardiography is a painless test that uses sound waves to create images of your heart. It provides your doctor with information about the size and shape of your heart and how well your heart's chambers and valves are working. This procedure takes approximately one hour. There are no restrictions for this procedure. Office will contact with results via phone or letter.    Follow-Up: 3 months - AFib clinic   Any Other Special Instructions Will Be Listed Below (If Applicable).  If you need a refill on your cardiac medications before your next appointment, please call your pharmacy.

## 2020-11-19 NOTE — Progress Notes (Signed)
Carelink Summary Report / Loop Recorder 

## 2020-11-26 ENCOUNTER — Other Ambulatory Visit: Payer: Self-pay | Admitting: Internal Medicine

## 2020-11-26 NOTE — Telephone Encounter (Signed)
This is a [place] pt °

## 2020-11-28 ENCOUNTER — Ambulatory Visit (INDEPENDENT_AMBULATORY_CARE_PROVIDER_SITE_OTHER): Payer: Medicare Other

## 2020-11-28 DIAGNOSIS — I4891 Unspecified atrial fibrillation: Secondary | ICD-10-CM | POA: Diagnosis not present

## 2020-11-28 LAB — CUP PACEART REMOTE DEVICE CHECK
Date Time Interrogation Session: 20220630011527
Implantable Pulse Generator Implant Date: 20190218

## 2020-12-10 ENCOUNTER — Ambulatory Visit (INDEPENDENT_AMBULATORY_CARE_PROVIDER_SITE_OTHER): Payer: Medicare Other

## 2020-12-10 DIAGNOSIS — I4891 Unspecified atrial fibrillation: Secondary | ICD-10-CM | POA: Diagnosis not present

## 2020-12-10 LAB — ECHOCARDIOGRAM COMPLETE
Area-P 1/2: 6.12 cm2
Calc EF: 65.7 %
MV M vel: 2.45 m/s
MV Peak grad: 23.9 mmHg
S' Lateral: 3.12 cm
Single Plane A2C EF: 68.4 %
Single Plane A4C EF: 61.5 %

## 2020-12-17 NOTE — Progress Notes (Signed)
Carelink Summary Report / Loop Recorder 

## 2020-12-26 ENCOUNTER — Telehealth: Payer: Self-pay | Admitting: *Deleted

## 2020-12-26 NOTE — Telephone Encounter (Signed)
Lesle Chris, LPN  01/29/5175  1:47 PM EDT Back to Top     Notified, copy to pcp.    Lesle Chris, LPN  1/60/7371 06:26 PM EDT      Left message to return call.   Lesle Chris, LPN  9/48/5462  4:42 PM EDT      No answer.    Hillis Range, MD  12/18/2020  5:24 PM EDT      Results reviewed.  Inetta Fermo, please inform pt of result. I will route to primary care also.

## 2020-12-30 LAB — CUP PACEART REMOTE DEVICE CHECK
Date Time Interrogation Session: 20220802011956
Implantable Pulse Generator Implant Date: 20190218

## 2020-12-31 ENCOUNTER — Ambulatory Visit (INDEPENDENT_AMBULATORY_CARE_PROVIDER_SITE_OTHER): Payer: Medicare Other

## 2020-12-31 DIAGNOSIS — I480 Paroxysmal atrial fibrillation: Secondary | ICD-10-CM

## 2021-01-05 ENCOUNTER — Telehealth: Payer: Self-pay

## 2021-01-05 NOTE — Telephone Encounter (Signed)
The patient thought she had an in-office appointment but it was a home remote pacer check. I explained to her that Remote pacer means it is with the bedside monitor. All she has to do is sleep by the monitor and it will send [place] the data. The patient states the lady at the front desk was rude and impatient with her. She traveled from [place] and it takes her an hour to come here. I apologized that she had the experience. I told her whoever wrote down her appointments might have assume she knew what remote pacer check mean since she had the loop since 2019. I apologize that the person did not explain remote pacer check. I let her know that she do not have press the button unless we ask her to. I told her she can just sleep by the monitor and the monitor will work automatically. The patient verbalized understanding and thanked me for my help.

## 2021-01-26 NOTE — Progress Notes (Signed)
Carelink Summary Report 

## 2021-02-03 ENCOUNTER — Ambulatory Visit (INDEPENDENT_AMBULATORY_CARE_PROVIDER_SITE_OTHER): Payer: Medicare Other

## 2021-02-03 DIAGNOSIS — I480 Paroxysmal atrial fibrillation: Secondary | ICD-10-CM | POA: Diagnosis not present

## 2021-02-03 LAB — CUP PACEART REMOTE DEVICE CHECK
Date Time Interrogation Session: 20220904012140
Implantable Pulse Generator Implant Date: 20190218

## 2021-02-11 NOTE — Progress Notes (Signed)
Carelink Summary Report / Loop Recorder 

## 2021-02-23 ENCOUNTER — Telehealth: Payer: Self-pay

## 2021-02-23 NOTE — Telephone Encounter (Signed)
LINQ alert received.  Device has reached RRT 9/25.  Attempted to contact patient to advise. No answer, LMTCB.

## 2021-02-24 NOTE — Telephone Encounter (Signed)
2nd attempt to contact patient. No answer, LMTCB. 

## 2021-02-25 NOTE — Telephone Encounter (Signed)
Pt returned phone call.  Advised of RRT status.  Patient indicates she would like to have device remoed, advised it must be scheduled with Dr. Johney Frame collegues  at [address] office.

## 2021-02-27 ENCOUNTER — Telehealth: Payer: Self-pay

## 2021-02-27 NOTE — Telephone Encounter (Signed)
After review of EMR including patient's last visit with Dr. Johney Frame 11/07/20, informed patient that Dr. Johney Frame referred her to AF clinic. Discussed rational for referral and how AF clinic can assist patient with AF management. Patient appreciative of explanation. Patient also stressed she was ready to have loop recorder removed. Discussed with patient that scheduler was aware and would give her a call next week.

## 2021-02-27 NOTE — Telephone Encounter (Signed)
The patient called asking for [facility] so she can schedule her loop explant.   Pt had questions on why she was scheduled to see the A-fib clinic. She states no one ever discuss with her about going to the A-fib clinic. She would like to speak with someone about this appointment. I let her speak with Portia, rn.

## 2021-02-28 ENCOUNTER — Other Ambulatory Visit: Payer: Self-pay | Admitting: Internal Medicine

## 2021-03-19 ENCOUNTER — Ambulatory Visit (HOSPITAL_COMMUNITY): Payer: Medicare Other | Admitting: Nurse Practitioner

## 2021-04-08 ENCOUNTER — Ambulatory Visit (HOSPITAL_COMMUNITY)
Admission: RE | Admit: 2021-04-08 | Discharge: 2021-04-08 | Disposition: A | Discharge status: Home / Self Care | Payer: Medicare Other | Source: Ambulatory Visit | Attending: Nurse Practitioner | Admitting: Nurse Practitioner

## 2021-04-08 ENCOUNTER — Other Ambulatory Visit: Payer: Self-pay

## 2021-04-08 ENCOUNTER — Encounter (HOSPITAL_COMMUNITY): Payer: Self-pay | Admitting: Nurse Practitioner

## 2021-04-08 VITALS — BP 144/68 | HR 51 | Ht 65.5 in | Wt 140.6 lb

## 2021-04-08 DIAGNOSIS — I480 Paroxysmal atrial fibrillation: Secondary | ICD-10-CM | POA: Insufficient documentation

## 2021-04-08 MED ORDER — DILTIAZEM HCL 30 MG PO TABS: ORAL_TABLET | ORAL | 1 refills | Status: DC

## 2021-04-08 NOTE — Progress Notes (Addendum)
Primary Care Physician: Leone Haven, MD Referring Physician: Dr. Hassell Halim is a 75 y.o. female with a h/o afib that is in the afib clinic for f/u. See last by Dr. Rayann Heman in June 2022.   She states that has about one episode a week. Will take short acting BB and the spell will be over in about one week.  Does not drink alcohol, no tobacco use, no excessive caffeine, stays busy taking of several yards for family members.   Today, she denies symptoms of palpitations, chest pain, shortness of breath, orthopnea, PND, lower extremity edema, dizziness, presyncope, syncope, or neurologic sequela. The patient is tolerating medications without difficulties and is otherwise without complaint today.   Past Medical History:  Diagnosis Date   Allergic rhinitis    Atrial fibrillation ([place])    s/p ablation 02/21/10   Hyperlipidemia    Pneumonia    S/P ablation of atrial flutter 02/21/2010   Past Surgical History:  Procedure Laterality Date   CARDIAC CATHETERIZATION  04/18/2009   Normal coronary arteries, EF 60%, 1+ MR   EP study  02/20/2010   CTI and PVI ablation by [place] N/A 07/18/2017   Procedure: LOOP RECORDER INSERTION;  Surgeon: Thompson Grayer, MD;  Location: [facility];  Service: Cardiovascular;  Laterality: N/A;    Current Outpatient Medications  Medication Sig Dispense Refill   calcium-vitamin D (OSCAL WITH D) 500-200 MG-UNIT tablet Take 1 tablet by mouth 3 (three) times a week.      Cyanocobalamin (VITAMIN B-12 PO) Take 1 tablet by mouth daily.     dorzolamide-timolol (COSOPT) 22.3-6.8 MG/ML ophthalmic solution Place 1 drop into both eyes 2 (two) times daily.     fluticasone (FLONASE) 50 MCG/ACT nasal spray Place 2 sprays into both nostrils daily as needed for allergies.      latanoprost (XALATAN) 0.005 % ophthalmic solution Place 1 drop into both eyes at bedtime.      meclizine (ANTIVERT) 25 MG tablet Take 1 tablet by mouth as  needed for dizziness.     metoprolol succinate (TOPROL-XL) 25 MG 24 hr tablet TAKE 1 TABLET BY MOUTH TWICE A DAY 180 tablet 3   metoprolol tartrate (LOPRESSOR) 25 MG tablet TAKE 1 TABLET BY MOUTH TWICE DAILY AS NEEDED FOR PALPITATIONS *NEEDS APPT* 180 tablet 0   nitroGLYCERIN (NITROSTAT) 0.4 MG SL tablet Place 1 tablet (0.4 mg total) under the tongue every 5 (five) minutes as needed for chest pain (MAX 3 TABLETS). 30 tablet 11   omeprazole (PRILOSEC) 40 MG capsule Take 40 mg by mouth daily as needed (for acid reflux).   5   ondansetron (ZOFRAN-ODT) 4 MG disintegrating tablet Take 4 mg by mouth every 8 (eight) hours as needed for nausea.     No current facility-administered medications for this encounter.    Allergies  Allergen Reactions   Pollen Extract Itching   Brimonidine Tartrate Rash    Other reaction(s): redness    Social History   Socioeconomic History   Marital status: Married    Spouse name: Not on file   Number of children: Not on file   Years of education: Not on file   Highest education level: Not on file  Occupational History   Occupation: Part time house cleaner    Employer: RETIRED  Tobacco Use   Smoking status: Never   Smokeless tobacco: Never  Vaping Use   Vaping Use: Never used  Substance and  Sexual Activity   Alcohol use: No   Drug use: No   Sexual activity: Not on file  Other Topics Concern   Not on file  Social History Narrative   Married   Lives with spouse in [place], [place]   Has 2 grown children   Social Determinants of Health   Financial Resource Strain: Not on file  Food Insecurity: Not on file  Transportation Needs: Not on file  Physical Activity: Not on file  Stress: Not on file  Social Connections: Not on file  Intimate Partner Violence: Not on file    Family History  Problem Relation Age of Onset   Heart failure Mother    Colon cancer Father    Hyperthyroidism Son    Hypothyroidism Daughter    Coronary artery disease Neg Hx      ROS- All systems are reviewed and negative except as per the HPI above  Physical Exam: Vitals:   04/08/21 1316  Weight: 63.8 kg  Height: 5' 5.5" (1.664 m)   Wt Readings from Last 3 Encounters:  04/08/21 63.8 kg  11/07/20 63.9 kg  02/20/20 62.6 kg    Labs: Lab Results  Component Value Date   NA 142 06/02/2017   K 4.3 06/02/2017   CL 104 06/02/2017   CO2 23 06/02/2017   GLUCOSE 97 06/02/2017   BUN 10 06/02/2017   CREATININE 0.80 06/02/2017   CALCIUM 9.4 06/02/2017   MG 2.1 06/02/2017   Lab Results  Component Value Date   INR 2.3 08/10/2011   No results found for: CHOL, HDL, LDLCALC, TRIG   GEN- The patient is well appearing, alert and oriented x 3 today.   Head- normocephalic, atraumatic Eyes-  Sclera clear, conjunctiva pink Ears- hearing intact Oropharynx- clear Neck- supple, no JVP Lymph- no cervical lymphadenopathy Lungs- Clear to ausculation bilaterally, normal work of breathing Heart- Regular rate and rhythm, no murmurs, rubs or gallops, PMI not laterally displaced GI- soft, NT, ND, + BS Extremities- no clubbing, cyanosis, or edema MS- no significant deformity or atrophy Skin- no rash or lesion Psych- euthymic mood, full affect Neuro- strength and sensation are intact  EKG-    Assessment and Plan:  1.  Paroxysmal atrial fibrillation She has frequent short episodes of afib, possibly one a week that lasts less than 40 mins S/p prior ablation in 2011 Dr. Johney Frame in the past  advised repeat ablation.  She  is currently not ready to proceed.  She does not wish to start  AAD  yet I will RX 30 mg cardizem to use for breakthrough afib   2 Linq Now with dead battery  Pt wants device to be removed  She has an appointment with Dr. Johney Frame 11/23 for removal   3. CHA2DS2VASc  score of 3 Chads2vasc score is now 3.  Given low burden, she declined OAC with Dr. Johney Frame on last visit.  However, with weekly episodes of afib, I discussed with pt again today if  she is interested in starting  drug to reduce risk of stroke She is now interested in starting drug No bleeding history Bleeding precautions reviewed .  Start eliquis 5 mg bid and I will get get a cbc/bmet thru her PCP office in one month since she lives in [place]   Afib clinic in 6 months   Lupita Leash C. Matthew Folks [facility] [address] [place], [place] [address] [phone]

## 2021-04-09 ENCOUNTER — Other Ambulatory Visit (HOSPITAL_COMMUNITY): Payer: Self-pay | Admitting: *Deleted

## 2021-04-09 DIAGNOSIS — I480 Paroxysmal atrial fibrillation: Secondary | ICD-10-CM

## 2021-04-09 MED ORDER — APIXABAN 5 MG PO TABS
5.0000 mg | ORAL_TABLET | Freq: Two times a day (BID) | ORAL | 3 refills | Status: DC
Start: 2021-04-09 — End: 2021-12-04

## 2021-04-09 NOTE — Addendum Note (Signed)
Encounter addended by: Newman Nip, NP on: 04/09/2021 9:10 AM  Actions taken: Clinical Note Signed

## 2021-04-22 ENCOUNTER — Other Ambulatory Visit: Payer: Self-pay

## 2021-04-22 ENCOUNTER — Ambulatory Visit (INDEPENDENT_AMBULATORY_CARE_PROVIDER_SITE_OTHER): Payer: Medicare Other | Admitting: Internal Medicine

## 2021-04-22 ENCOUNTER — Encounter: Payer: Self-pay | Admitting: Internal Medicine

## 2021-04-22 VITALS — BP 126/68 | HR 52 | Ht 65.0 in | Wt 140.8 lb

## 2021-04-22 DIAGNOSIS — R42 Dizziness and giddiness: Secondary | ICD-10-CM

## 2021-04-22 DIAGNOSIS — I480 Paroxysmal atrial fibrillation: Secondary | ICD-10-CM | POA: Diagnosis not present

## 2021-04-22 HISTORY — PX: OTHER SURGICAL HISTORY: SHX169

## 2021-04-22 NOTE — Progress Notes (Signed)
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 female who presents today for routine electrophysiology followup.  Since last being seen in our clinic, the patient reports doing very well.  She has rare episodes of tachypalpitations with dizziness.  Her most recent episode was 04/15/21.  She had abrupt onset of afib lasting about 30 minutes which terminated with prn diltiazem.  Today, she denies symptoms of palpitations, chest pain, shortness of breath,  lower extremity edema, dizziness, presyncope, or syncope.  The patient is otherwise without complaint today.   Past Medical History:  Diagnosis Date   Allergic rhinitis    Atrial fibrillation ([facility])    s/p ablation 02/21/10   Hyperlipidemia    Pneumonia    S/P ablation of atrial flutter 02/21/2010   Past Surgical History:  Procedure Laterality Date   CARDIAC CATHETERIZATION  04/18/2009   Normal coronary arteries, EF 60%, 1+ MR   EP study  02/20/2010   CTI and PVI ablation by [facility]   LOOP RECORDER INSERTION N/A 07/18/2017   Procedure: LOOP RECORDER INSERTION;  Surgeon: Hillis Range, MD;  Location: [facility];  Service: Cardiovascular;  Laterality: N/A;    ROS- all systems are reviewed and negatives except as per HPI above  Current Outpatient Medications  Medication Sig Dispense Refill   apixaban (ELIQUIS) 5 MG TABS tablet Take 1 tablet (5 mg total) by mouth 2 (two) times daily. 60 tablet 3   calcium-vitamin D (OSCAL WITH D) 500-200 MG-UNIT tablet Take 1 tablet by mouth 3 (three) times a week.      Cyanocobalamin (VITAMIN B-12 PO) Take 1 tablet by mouth daily.     diltiazem (CARDIZEM) 30 MG tablet Take 1 tablet every 4 hours AS NEEDED for heart rate >100 30 tablet 1   dorzolamide-timolol (COSOPT) 22.3-6.8 MG/ML ophthalmic solution Place 1 drop into both eyes 2 (two) times daily.     fluticasone (FLONASE) 50 MCG/ACT nasal spray Place 2 sprays into both nostrils daily as needed for allergies.      latanoprost (XALATAN) 0.005  % ophthalmic solution Place 1 drop into both eyes at bedtime.      meclizine (ANTIVERT) 25 MG tablet Take 1 tablet by mouth as needed for dizziness.     metoprolol succinate (TOPROL-XL) 25 MG 24 hr tablet TAKE 1 TABLET BY MOUTH TWICE A DAY 180 tablet 3   metoprolol tartrate (LOPRESSOR) 25 MG tablet TAKE 1 TABLET BY MOUTH TWICE DAILY AS NEEDED FOR PALPITATIONS *NEEDS APPT* 180 tablet 0   nitroGLYCERIN (NITROSTAT) 0.4 MG SL tablet Place 1 tablet (0.4 mg total) under the tongue every 5 (five) minutes as needed for chest pain (MAX 3 TABLETS). 30 tablet 11   omeprazole (PRILOSEC) 40 MG capsule Take 40 mg by mouth daily as needed (for acid reflux).   5   ondansetron (ZOFRAN-ODT) 4 MG disintegrating tablet Take 4 mg by mouth every 8 (eight) hours as needed for nausea.     No current facility-administered medications for this visit.    Physical Exam: Vitals:   04/22/21 1104  BP: 126/68  Pulse: (!) 52  SpO2: 99%  Weight: 140 lb 12.8 oz (63.9 kg)  Height: 5\' 5"  (1.651 m)    GEN- The patient is well appearing, alert and oriented x 3 today.   Head- normocephalic, atraumatic Eyes-  Sclera clear, conjunctiva pink Ears- hearing intact Oropharynx- clear Lungs- Clear to ausculation bilaterally, normal work of breathing Heart- Regular rate and rhythm,  no murmurs, rubs or gallops, PMI not laterally displaced GI- soft, NT, ND, + BS Extremities- no clubbing, cyanosis, or edema  Wt Readings from Last 3 Encounters:  04/22/21 140 lb 12.8 oz (63.9 kg)  04/08/21 140 lb 9.6 oz (63.8 kg)  11/07/20 140 lb 12.8 oz (63.9 kg)    EKG tracing ordered today is personally reviewed and shows sinus bradycardia  Assessment and Plan:  Paroxysmal atrial fibrillation S/p ablation 2011 She has done well She has declined OAC but will reconsider if her AF burden increases ILR interrogation today reveals afib with RVR on 04/15/21 lasting 30 minutes.  She had similar episode of afib 01/31/21 and 02/10/21 Her overall  afib burden is 0.2% For now, she will use prn diltiazem and metoprolol.  Her ILR is at RRT battery status.  We discussed options of removing and even replacing her device at length.  She is very clear that she wishes to have this removed without a new device implanted.  Risks of ILR removal including but not limited to bleeding and infection were discussed with the patient who wishes to proceed.  Return to see me in [place] in 3 months  Hillis Range MD, [facility] 04/22/2021 11:27 AM      PROCEDURES:   1. Implantable loop recorder explantation    DESCRIPTION OF PROCEDURE:  Informed written consent was obtained.  The patient required no sedation for the procedure today.   The patients left chest was therefore prepped and draped in the usual sterile fashion.  The skin overlying the ILR monitor was infiltrated with lidocaine for local analgesia.  A 0.5-cm incision was made over the site.  The previously implanted ILR was exposed and removed using a combination of sharp and blunt dissection.  Steri- Strips and a sterile dressing were then applied. EBL<46ml.  There were no early apparent complications.     CONCLUSIONS:   1. Successful explantation of a [employer]onic Reveal LINQ implantable loop recorder   2. No early apparent complications.        Hillis Range MD, [facility] 04/22/2021 11:48 AM

## 2021-04-22 NOTE — Patient Instructions (Addendum)
Medication Instructions:  Your physician recommends that you continue on your current medications as directed. Please refer to the Current Medication list given to you today.  Labwork: None ordered.  Testing/Procedures: None ordered.  Follow-Up:  Your physician wants you to follow-up in: 07/03/21 at 3:30 pm with Dr. Johney Frame in [place].     Implantable Loop Recorder Removal, Care After This sheet gives you information about how to care for yourself after your procedure. Your health care provider may also give you more specific instructions. If you have problems or questions, contact your health care provider. What can I expect after the procedure? After the procedure, it is common to have: Soreness or discomfort near the incision. Some swelling or bruising near the incision.  Follow these instructions at home: Incision care   Leave your outer dressing on for 24 hours.  After 24 hours you can remove your outer dressing and shower. Leave adhesive strips in place. These skin closures may need to stay in place for 1-2 weeks. If adhesive strip edges start to loosen and curl up, you may trim the loose edges.  You may remove the strips if they have not fallen off after 2 weeks. Check your incision area every day for signs of infection. Check for: Redness, swelling, or pain. Fluid or blood. Warmth. Pus or a bad smell. Do not take baths, swim, or use a hot tub until your incision is completely healed. If your wound site starts to bleed apply pressure.      If you have any questions/concerns please call the device clinic at [phone].  Activity  Return to your normal activities.  Contact a health care provider if: You have redness, swelling, or pain around your incision. You have a fever.    Patient Assistance Programs  If you cannot afford one of the medications listed on the next page, please contact the patient assistance program at the phone number listed beside the medication  you need assistance with.  Please ask any questions you have concerning their medication assistance program, and your eligibility for their assistance program.  In most cases, if you are approved you will receive your medication free of charge for the remainder of the year that you are applying in.  If it seems likely you would be approved, please ask them to mail an application to your home address (not faxed to the office).  You may also obtain an application by visiting the patient assistance program at the website listed beside the medication you need assistance with.  Once you receive the application, please complete your part of the application, obtain any documents required by the assistance program you are applying with (please see instruction page of the application for required documents), bring all paperwork to your Cardiology Provider's office for drop off and will we take care of the provider page of your application and fax to the appropriate patient assistance program you are applying with.  If you have any questions for [place] along the way, please reach out to [place] and we will assist you to the best of our ability.  We do ask that you allow [place] an adequate amount of time to get your application ready to be faxed to the [place] you are applying with.  You may call the Foundation you applied with to check the progress of your application at any time.  If you decide you do not want to apply for patient assistance, or if you are denied for patient assistance and  cannot afford your medication, please contact your cardiologist's office so we can discuss switching you to a medication that is more affordable for you.  Taking your medications as prescribed/directed by your Cardiologist for your heart condition is extremely important for your heart health.  ELIQUIS: [place] (BMSPAF) [phone] or online at [facility] [place]  [email or web address]) to print an application (Requirements and instructions will be included with the application).

## 2021-04-27 ENCOUNTER — Other Ambulatory Visit: Payer: Self-pay | Admitting: Internal Medicine

## 2021-05-28 ENCOUNTER — Other Ambulatory Visit: Payer: Self-pay | Admitting: Internal Medicine

## 2021-07-03 ENCOUNTER — Ambulatory Visit: Payer: Medicare Other | Admitting: Internal Medicine

## 2021-07-27 ENCOUNTER — Ambulatory Visit: Payer: Medicare Other | Admitting: Cardiology

## 2021-08-07 ENCOUNTER — Encounter: Payer: Self-pay | Admitting: Internal Medicine

## 2021-08-07 ENCOUNTER — Ambulatory Visit (INDEPENDENT_AMBULATORY_CARE_PROVIDER_SITE_OTHER): Payer: Medicare Other | Admitting: Internal Medicine

## 2021-08-07 ENCOUNTER — Other Ambulatory Visit: Payer: Self-pay

## 2021-08-07 VITALS — BP 120/64 | HR 59 | Ht 65.0 in | Wt 138.4 lb

## 2021-08-07 DIAGNOSIS — I480 Paroxysmal atrial fibrillation: Secondary | ICD-10-CM

## 2021-08-07 NOTE — Progress Notes (Signed)
? ?PCP: Leone Haven, MD ?  ?Primary EP: Dr Rayann Heman ? ?Jessica Fowler is a 76 y.o. female who presents today for routine electrophysiology followup.  Since last being seen in our clinic, the patient reports doing very well.  She continues to have episodes of tachypalpitations.  These are rarely associated with dizziness.  She has been found previously by ILR to have both atrial tachycardia and afib though her burden was < 1% and episodes were mostly quite short.  Today, she denies symptoms of chest pain, shortness of breath,  lower extremity edema, dizziness, presyncope, or syncope.  The patient is otherwise without complaint today.  ? ?Past Medical History:  ?Diagnosis Date  ? Allergic rhinitis   ? Atrial fibrillation [facility])   ? s/p ablation 02/21/10  ? Hyperlipidemia   ? Pneumonia   ? S/P ablation of atrial flutter 02/21/2010  ? ?Past Surgical History:  ?Procedure Laterality Date  ? CARDIAC CATHETERIZATION  04/18/2009  ? Normal coronary arteries, EF 60%, 1+ MR  ? EP study  02/20/2010  ? CTI and PVI ablation by [facility]  ? implantable loop recorder removal  04/22/2021  ? MDT LINQ removed by Dr Rayann Heman for RRT battery status  ? LOOP RECORDER INSERTION N/A 07/18/2017  ? Procedure: LOOP RECORDER INSERTION;  Surgeon: Thompson Grayer, MD;  Location: [facility];  Service: Cardiovascular;  Laterality: N/A;  ? ? ?ROS- all systems are reviewed and negatives except as per HPI above ? ?Current Outpatient Medications  ?Medication Sig Dispense Refill  ? apixaban (ELIQUIS) 5 MG TABS tablet Take 1 tablet (5 mg total) by mouth 2 (two) times daily. 60 tablet 3  ? calcium-vitamin D (OSCAL WITH D) 500-200 MG-UNIT tablet Take 1 tablet by mouth 3 (three) times a week.     ? Cyanocobalamin (VITAMIN B-12 PO) Take 1 tablet by mouth daily.    ? diltiazem (CARDIZEM) 30 MG tablet Take 1 tablet every 4 hours AS NEEDED for heart rate >100 30 tablet 1  ? dorzolamide-timolol (COSOPT) 22.3-6.8 MG/ML ophthalmic solution Place 1 drop into both  eyes 2 (two) times daily.    ? fluticasone (FLONASE) 50 MCG/ACT nasal spray Place 2 sprays into both nostrils daily as needed for allergies.     ? latanoprost (XALATAN) 0.005 % ophthalmic solution Place 1 drop into both eyes at bedtime.     ? meclizine (ANTIVERT) 25 MG tablet Take 1 tablet by mouth as needed for dizziness.    ? metoprolol succinate (TOPROL-XL) 25 MG 24 hr tablet TAKE 1 TABLET BY MOUTH TWICE A DAY (Patient taking differently: 25 mg in the morning.) 180 tablet 3  ? metoprolol tartrate (LOPRESSOR) 25 MG tablet TAKE 1 TABLET BY MOUTH TWICE DAILY AS NEEDED FOR PALPITATIONS *NEEDS APPT* (Patient taking differently: Take 25 mg by mouth at bedtime.) 180 tablet 3  ? nitroGLYCERIN (NITROSTAT) 0.4 MG SL tablet Place 1 tablet (0.4 mg total) under the tongue every 5 (five) minutes as needed for chest pain (MAX 3 TABLETS). 30 tablet 11  ? omeprazole (PRILOSEC) 40 MG capsule Take 40 mg by mouth daily as needed (for acid reflux).   5  ? ondansetron (ZOFRAN-ODT) 4 MG disintegrating tablet Take 4 mg by mouth every 8 (eight) hours as needed for nausea. (Patient not taking: Reported on 08/07/2021)    ? ?No current facility-administered medications for this visit.  ? ? ?Physical Exam: ?Vitals:  ? 08/07/21 1357  ?BP: 120/64  ?Pulse: (!) 59  ?SpO2: 99%  ?Weight:  138 lb 6.4 oz (62.8 kg)  ?Height: 5\' 5"  (1.651 m)  ? ? ?GEN- The patient is well appearing, alert and oriented x 3 today.   ?Head- normocephalic, atraumatic ?Eyes-  Sclera clear, conjunctiva pink ?Ears- hearing intact ?Oropharynx- clear ?Lungs- Clear to ausculation bilaterally, normal work of breathing ?Heart- Regular rate and rhythm, no murmurs, rubs or gallops, PMI not laterally displaced ?GI- soft, NT, ND, + BS ?Extremities- no clubbing, cyanosis, or edema ? ?Wt Readings from Last 3 Encounters:  ?08/07/21 138 lb 6.4 oz (62.8 kg)  ?04/22/21 140 lb 12.8 oz (63.9 kg)  ?04/08/21 140 lb 9.6 oz (63.8 kg)  ? ? ? ?Assessment and Plan: ? ?Paroxysmal atrial  fibrillation ?S/p ablation 2011 ?She has done well with low burden since ?She declines [facility] but will reconsider if her AF burden increases.  Burden previously by ILR was <1% ?If episodes worsen, we could also consider repeat ablation. ? ?Risks, benefits and potential toxicities for medications prescribed and/or refilled reviewed with patient today.  ? ?Follow-up in AF clinic in 6 months ? ?Thompson Grayer MD, FACC ?08/07/2021 ?2:08 PM ? ? ? ? ?

## 2021-08-07 NOTE — Patient Instructions (Signed)
Medication Instructions:  ?Continue all current medications. ? ?Labwork: ?none ? ?Testing/Procedures: ?none ? ?Follow-Up: ?6 mo - afib clinic ? ?Any Other Special Instructions Will Be Listed Below (If Applicable). ? ? ?If you need a refill on your cardiac medications before your next appointment, please call your pharmacy. ? ?

## 2021-11-13 ENCOUNTER — Ambulatory Visit: Payer: Medicare Other | Admitting: Internal Medicine

## 2021-12-04 ENCOUNTER — Ambulatory Visit (INDEPENDENT_AMBULATORY_CARE_PROVIDER_SITE_OTHER): Payer: Medicare Other | Admitting: Internal Medicine

## 2021-12-04 ENCOUNTER — Encounter: Payer: Self-pay | Admitting: Internal Medicine

## 2021-12-04 VITALS — BP 108/58 | HR 63 | Ht 65.5 in | Wt 138.4 lb

## 2021-12-04 DIAGNOSIS — I480 Paroxysmal atrial fibrillation: Secondary | ICD-10-CM

## 2021-12-04 MED ORDER — APIXABAN 5 MG PO TABS: 5.0000 mg | ORAL_TABLET | Freq: Two times a day (BID) | ORAL | 6 refills | Status: DC

## 2021-12-04 NOTE — Patient Instructions (Signed)
Medication Instructions:  Restart Eliquis 5mg  twice a day  Continue all other medications.     Labwork: none  Testing/Procedures: none  Follow-Up: 2 months - Dr. in [place] office   Any Other Special Instructions Will Be Listed Below (If Applicable).   If you need a refill on your cardiac medications before your next appointment, please call your pharmacy.

## 2021-12-04 NOTE — Progress Notes (Signed)
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  Since last being seen in our clinic, the patient reports doing reasonably well.  She continues to have occasional palpitations and afib which produce significant anxiety.  She has not been taking OAC.  Today, she denies symptoms of chest pain, shortness of breath,  lower extremity edema, dizziness, presyncope, or syncope.  The patient is otherwise without complaint today.   Past Medical History:  Diagnosis Date   Allergic rhinitis    Atrial fibrillation (HCC)    s/p ablation 02/21/10   Hyperlipidemia    Pneumonia    S/P ablation of atrial flutter 02/21/2010   Past Surgical History:  Procedure Laterality Date   CARDIAC CATHETERIZATION  04/18/2009   Normal coronary arteries, EF 60%, 1+ MR   EP study  02/20/2010   [facility]I and PVI ablation by [facility]   implantable loop recorder removal  04/22/2021   MDT LINQ removed by Dr Johney Frame for RRT battery status   LOOP RECORDER INSERTION N/A 07/18/2017   Procedure: LOOP RECORDER INSERTION;  Surgeon: Hillis Range, MD;  Location: [facility];  Service: Cardiovascular;  Laterality: N/A;    ROS- all systems are reviewed and negatives except as per HPI above  Current Outpatient Medications  Medication Sig Dispense Refill   calcium-vitamin D (OSCAL WITH D) 500-200 MG-UNIT tablet Take 1 tablet by mouth 3 (three) times a week.      diltiazem (CARDIZEM) 30 MG tablet Take 1 tablet every 4 hours AS NEEDED for heart rate >100 30 tablet 1   dorzolamide-timolol (COSOPT) 22.3-6.8 MG/ML ophthalmic solution Place 1 drop into both eyes 2 (two) times daily.     fluticasone (FLONASE) 50 MCG/ACT nasal spray Place 2 sprays into both nostrils daily as needed for allergies.      latanoprost (XALATAN) 0.005 % ophthalmic solution Place 1 drop into both eyes at bedtime.      metoprolol succinate (TOPROL-XL) 25 MG 24 hr  tablet TAKE 1 TABLET BY MOUTH TWICE A DAY (Patient taking differently: 25 mg in the morning.) 180 tablet 3   metoprolol tartrate (LOPRESSOR) 25 MG tablet TAKE 1 TABLET BY MOUTH TWICE DAILY AS NEEDED FOR PALPITATIONS *NEEDS APPT* (Patient taking differently: Take 25 mg by mouth at bedtime.) 180 tablet 3   nitroGLYCERIN (NITROSTAT) 0.4 MG SL tablet Place 1 tablet (0.4 mg total) under the tongue every 5 (five) minutes as needed for chest pain (MAX 3 TABLETS). 30 tablet 11   apixaban (ELIQUIS) 5 MG TABS tablet Take 1 tablet (5 mg total) by mouth 2 (two) times daily. 60 tablet 3   Cyanocobalamin (VITAMIN B-12 PO) Take 1 tablet by mouth daily. (Patient not taking: Reported on 12/04/2021)     meclizine (ANTIVERT) 25 MG tablet Take 1 tablet by mouth as needed for dizziness. (Patient not taking: Reported on 12/04/2021)     omeprazole (PRILOSEC) 40 MG capsule Take 40 mg by mouth daily as needed (for acid reflux).  (Patient not taking: Reported on 12/04/2021)  5   ondansetron (ZOFRAN-ODT) 4 MG disintegrating tablet Take 4 mg by mouth every 8 (eight) hours as needed for nausea. (Patient not taking: Reported on 08/07/2021)     No current facility-administered medications for this visit.    Physical Exam: Vitals:   12/04/21 1252  BP: (!) 108/58  Pulse: 63  SpO2: 96%  Weight: 138 lb 6.4 oz (  62.8 kg)  Height: 5' 5.5" (1.664 m)    GEN- The patient is well appearing, alert and oriented x 3 today.   Head- normocephalic, atraumatic Eyes-  Sclera clear, conjunctiva pink Ears- hearing intact Oropharynx- clear Lungs- Clear to ausculation bilaterally, normal work of breathing Heart- Regular rate and rhythm, no murmurs, rubs or gallops, PMI not laterally displaced GI- soft, NT, ND, + BS Extremities- no clubbing, cyanosis, or edema  Wt Readings from Last 3 Encounters:  12/04/21 138 lb 6.4 oz (62.8 kg)  08/07/21 138 lb 6.4 oz (62.8 kg)  04/22/21 140 lb 12.8 oz (63.9 kg)   Assessment and Plan:  Paroxysmal atrial  fibrillation She has done very well s/p ablation 2011.   Restart eliquis She has for years stated that her afib "is getting worse".  She is very anxious with palpitations.  She had an ILR implanted which during similar symptoms revealed burden of <1% with short episodes of afib/ atach. I am not convinced that she would benefit from additional ablation, however she wishes to consider this. I will have her see Dr Nelly Laurence when he establishes in [place] to consider repeat ablation vs repeat ILR implantation to further evaluate her arrhythmia burden.   Hillis Range MD, [facility] 12/04/2021 1:01 PM

## 2022-05-07 ENCOUNTER — Encounter: Payer: Self-pay | Admitting: Cardiovascular Disease

## 2022-05-07 ENCOUNTER — Telehealth: Payer: Self-pay | Admitting: Cardiovascular Disease

## 2022-05-07 ENCOUNTER — Ambulatory Visit: Payer: Medicare Other | Attending: Cardiovascular Disease | Admitting: Cardiovascular Disease

## 2022-05-07 VITALS — BP 124/72 | HR 81 | Ht 65.0 in | Wt 136.8 lb

## 2022-05-07 DIAGNOSIS — I480 Paroxysmal atrial fibrillation: Secondary | ICD-10-CM | POA: Diagnosis present

## 2022-05-07 MED ORDER — DILTIAZEM HCL 30 MG PO TABS: ORAL_TABLET | ORAL | 6 refills | Status: DC

## 2022-05-07 NOTE — Progress Notes (Signed)
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  Since last being seen in our clinic, the patient reports doing reasonably well.    She continues to have occasional palpitations and afib which produce significant anxiety.    When I mentioned it would be helpful to replace the loop recorder, she mentioned that she didn't think it did anything because no one called her when she had a fast heart rate. She reports that she has been having episodes of rapid rates about every other day. She states that she has had to go to the ER several times.  Today, she denies symptoms of chest pain, shortness of breath,  lower extremity edema, dizziness, presyncope, or syncope.  The patient is otherwise without complaint today.   Past Medical History:  Diagnosis Date   Allergic rhinitis    Atrial fibrillation (HCC)    s/p ablation 02/21/10   Hyperlipidemia    Pneumonia    S/P ablation of atrial flutter 02/21/2010   Past Surgical History:  Procedure Laterality Date   CARDIAC CATHETERIZATION  04/18/2009   Normal coronary arteries, EF 60%, 1+ MR   EP study  02/20/2010   [facility]I and PVI ablation by [facility]   implantable loop recorder removal  04/22/2021   MDT LINQ removed by Dr Johney Frame for RRT battery status   LOOP RECORDER INSERTION N/A 07/18/2017   Procedure: LOOP RECORDER INSERTION;  Surgeon: Hillis Range, MD;  Location: [facility];  Service: Cardiovascular;  Laterality: N/A;    ROS- all systems are reviewed and negatives except as per HPI above  Current Outpatient Medications  Medication Sig Dispense Refill   apixaban (ELIQUIS) 5 MG TABS tablet Take 1 tablet (5 mg total) by mouth 2 (two) times daily. 60 tablet 6   calcium-vitamin D (OSCAL WITH D) 500-200 MG-UNIT tablet Take 1 tablet by mouth 3 (three) times a week.      Cyanocobalamin (VITAMIN B-12 PO) Take 1 tablet by mouth daily.     diltiazem  (CARDIZEM) 30 MG tablet Take 1 tablet every 4 hours AS NEEDED for heart rate >100 30 tablet 1   dorzolamide-timolol (COSOPT) 22.3-6.8 MG/ML ophthalmic solution Place 1 drop into both eyes 2 (two) times daily.     fluticasone (FLONASE) 50 MCG/ACT nasal spray Place 2 sprays into both nostrils daily as needed for allergies.      latanoprost (XALATAN) 0.005 % ophthalmic solution Place 1 drop into both eyes at bedtime.      meclizine (ANTIVERT) 25 MG tablet Take 1 tablet by mouth as needed for dizziness.     metoprolol succinate (TOPROL-XL) 25 MG 24 hr tablet Take 25 mg by mouth every morning.     metoprolol tartrate (LOPRESSOR) 25 MG tablet Take 25 mg by mouth at bedtime.     nitroGLYCERIN (NITROSTAT) 0.4 MG SL tablet Place 1 tablet (0.4 mg total) under the tongue every 5 (five) minutes as needed for chest pain (MAX 3 TABLETS). 30 tablet 11   No current facility-administered medications for this visit.    Physical Exam: Vitals:   05/07/22 1301  Weight: 136 lb 12.8 oz (62.1 kg)  Height: 5\' 5"  (1.651 m)    GEN- The patient is well appearing, alert and oriented x 3 today.   Head- normocephalic, atraumatic Eyes-  Sclera clear, conjunctiva pink Ears- hearing intact Oropharynx- clear Lungs- Clear to ausculation bilaterally, normal work  of breathing Heart- Regular rate and rhythm, no murmurs, rubs or gallops, PMI not laterally displaced GI- soft, NT, ND, + BS Extremities- no clubbing, cyanosis, or edema  Wt Readings from Last 3 Encounters:  05/07/22 136 lb 12.8 oz (62.1 kg)  12/04/21 138 lb 6.4 oz (62.8 kg)  08/07/21 138 lb 6.4 oz (62.8 kg)   Assessment and Plan:  Paroxysmal atrial fibrillation She has done very well s/p ablation 2011.   Continue eliquis  eliquis She has for years stated that her afib "is getting worse".  She is very anxious with palpitations.  She had an ILR implanted which during similar symptoms revealed burden of <1% with short episodes of afib/ atach. She reiterates  that her symptoms are getting worse. I think replacement of the ILR is reasonable, but she is hesitant. Will place 30 day monitor.   2. Secondary hypercoagulable state: continue apixaban   [name], MD  05/07/2022 1:10 PM

## 2022-05-07 NOTE — Telephone Encounter (Signed)
[employer]  30 day [employer] monitor - afib 

## 2022-05-07 NOTE — Patient Instructions (Signed)
Medication Instructions:  Continue all current medications.  Labwork: none  Testing/Procedures: Your physician has recommended that you wear a 30 day event monitor. Event monitors are medical devices that record the heart's electrical activity. Doctors most often [place] these monitors to diagnose arrhythmias. Arrhythmias are problems with the speed or rhythm of the heartbeat. The monitor is a small, portable device. You can wear one while you do your normal daily activities. This is usually used to diagnose what is causing palpitations/syncope (passing out).  Follow-Up: Next available after monitor   Any Other Special Instructions Will Be Listed Below (If Applicable).   If you need a refill on your cardiac medications before your next appointment, please call your pharmacy.

## 2022-06-10 ENCOUNTER — Telehealth: Payer: Self-pay | Admitting: *Deleted

## 2022-06-10 NOTE — Telephone Encounter (Signed)
Fax received from [employer] stating "cancellation notification".    Call placed to daughter Maudie Mercury) - states they never could get the monitor to work.  Says she does not have internet at her house so she sent back to [employer].    Daughter states that she may be open to another loop recorder but would have to be discussed.    Will send to Dr. Myles Gip for further suggestions.

## 2022-06-14 NOTE — Telephone Encounter (Signed)
Left message to return call 

## 2022-06-17 NOTE — Telephone Encounter (Signed)
Left message to return call 

## 2022-06-22 NOTE — Telephone Encounter (Signed)
Spoke with daughter Miguel Dibble) - states that she will be seeing her tomorrow & will discuss with her if she would like to move forward with this.

## 2022-06-28 NOTE — Telephone Encounter (Signed)
No call back yet.  Patient already has follow up with Dr. Myles Gip for 07/15/2022 - she can discuss further at that time.

## 2022-07-08 ENCOUNTER — Encounter (HOSPITAL_COMMUNITY): Payer: Self-pay | Admitting: *Deleted

## 2022-07-15 ENCOUNTER — Encounter: Payer: Self-pay | Admitting: Cardiovascular Disease

## 2022-07-15 ENCOUNTER — Ambulatory Visit: Payer: Medicare Other | Attending: Cardiovascular Disease | Admitting: Cardiovascular Disease

## 2022-07-15 VITALS — BP 130/60 | HR 52 | Ht 65.5 in | Wt 138.2 lb

## 2022-07-15 DIAGNOSIS — I480 Paroxysmal atrial fibrillation: Secondary | ICD-10-CM | POA: Insufficient documentation

## 2022-07-15 NOTE — Patient Instructions (Signed)
Medication Instructions:  Continue all current medications.  Labwork: none  Testing/Procedures: none  Follow-Up: 1 year - Dr.  Mealor    Any Other Special Instructions Will Be Listed Below (If Applicable).   If you need a refill on your cardiac medications before your next appointment, please call your pharmacy.  

## 2022-07-15 NOTE — Progress Notes (Signed)
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  Since last being seen in our clinic, the patient reports doing reasonably well.    She continues to have occasional palpitations and afib which produce significant anxiety.    When I mentioned it would be helpful to replace the loop recorder, she mentioned that she didn't think it did anything because no one called her when she had a fast heart rate. She reports that she has been having episodes of rapid rates about every other day. She states that she has had to go to the ER several times.  07/15/2021 She presents today to review data from a wearable monitor. Unfortunately, she did not wear the monitor even though she received it in the mail.  Today, she denies symptoms of chest pain, shortness of breath,  lower extremity edema, dizziness, presyncope, or syncope. She continues to have palpitations. The patient is otherwise without complaint today.   Past Medical History:  Diagnosis Date   Allergic rhinitis    Atrial fibrillation ([place])    s/p ablation 02/21/10   Hyperlipidemia    Pneumonia    S/P ablation of atrial flutter 02/21/2010   Past Surgical History:  Procedure Laterality Date   CARDIAC CATHETERIZATION  04/18/2009   Normal coronary arteries, EF 60%, 1+ MR   EP study  02/20/2010   CTI and PVI ablation by [facility]   implantable loop recorder removal  04/22/2021   MDT LINQ removed by Dr Rayann Heman for RRT battery status   LOOP RECORDER INSERTION N/A 07/18/2017   Procedure: LOOP RECORDER INSERTION;  Surgeon: Thompson Grayer, MD;  Location: [facility];  Service: Cardiovascular;  Laterality: N/A;    ROS- all systems are reviewed and negatives except as per HPI above  Current Outpatient Medications  Medication Sig Dispense Refill   apixaban (ELIQUIS) 5 MG TABS tablet Take 1 tablet (5 mg total) by mouth 2 (two) times daily. 60 tablet  6   calcium-vitamin D (OSCAL WITH D) 500-200 MG-UNIT tablet Take 1 tablet by mouth 3 (three) times a week.      Cyanocobalamin (VITAMIN B-12 PO) Take 1 tablet by mouth daily.     diltiazem (CARDIZEM) 30 MG tablet Take 1 tablet every 4 hours AS NEEDED for heart rate >100 30 tablet 6   dorzolamide-timolol (COSOPT) 22.3-6.8 MG/ML ophthalmic solution Place 1 drop into both eyes 2 (two) times daily.     fluticasone (FLONASE) 50 MCG/ACT nasal spray Place 2 sprays into both nostrils daily as needed for allergies.      latanoprost (XALATAN) 0.005 % ophthalmic solution Place 1 drop into both eyes at bedtime.      meclizine (ANTIVERT) 25 MG tablet Take 1 tablet by mouth as needed for dizziness.     metoprolol succinate (TOPROL-XL) 25 MG 24 hr tablet Take 25 mg by mouth every morning.     metoprolol tartrate (LOPRESSOR) 25 MG tablet Take 25 mg by mouth at bedtime.     nitroGLYCERIN (NITROSTAT) 0.4 MG SL tablet Place 1 tablet (0.4 mg total) under the tongue every 5 (five) minutes as needed for chest pain (MAX 3 TABLETS). 30 tablet 11   No current facility-administered medications for this visit.    Physical Exam: Vitals:   07/15/22 1036  BP: 130/60  Pulse: (!) 52  SpO2: 95%  Weight: 138 lb 3.2 oz (62.7 kg)  Height: 5'  5.5" (1.664 m)     Gen: Appears comfortable, well-nourished CV: RRR, no dependent edema Pulm: breathing easily   Wt Readings from Last 3 Encounters:  05/07/22 136 lb 12.8 oz (62.1 kg)  12/04/21 138 lb 6.4 oz (62.8 kg)  08/07/21 138 lb 6.4 oz (62.8 kg)   Assessment and Plan:  Paroxysmal atrial fibrillation She has done very well s/p ablation 2011.   Continue eliquis   She has for years stated that her afib "is getting worse".  She is very anxious with palpitations.  She had an ILR implanted which during similar symptoms revealed burden of <1% with short episodes of afib/ a tach. She reiterates that her symptoms are getting worse. I recommended replacement of ILR.   2.  Secondary hypercoagulable state: continue apixaban   Melida Quitter, MD  07/15/2022 8:53 AM

## 2022-09-01 ENCOUNTER — Telehealth: Payer: Self-pay | Admitting: Cardiovascular Disease

## 2022-09-01 NOTE — Telephone Encounter (Signed)
Patient states that for the past 2 months, she has been going in and out of afib. Went to the ER almost 2 weeks ago and while at the ER, went back into rhythum. States that nothing was really done. Denies chest pains or SOB but has dome intermittent dizziness. Requesting to come in for appointment. Appointment scheduled for Monday, 4/8 @ 1:30 pm.

## 2022-09-01 NOTE — Telephone Encounter (Signed)
Patient c/o Palpitations:  High priority if patient c/o lightheadedness, shortness of breath, or chest pain  How long have you had palpitations/irregular HR/ Afib? Are you having the symptoms now?  Heart have been out of rythm  Are you currently experiencing lightheadedness, SOB or CP? Having dizziness, not at this time  Do you have a history of afib (atrial fibrillation) or irregular heart rhythm?   Have you checked your BP or HR? (document readings if available):  do not know what her blood pressure is  Are you experiencing any other symptoms? Feel like she is going to pass out at times- wants to be seen- first available is 09-15-22

## 2022-09-06 ENCOUNTER — Ambulatory Visit: Payer: Medicare Other | Attending: Nurse Practitioner | Admitting: Nurse Practitioner

## 2022-09-06 ENCOUNTER — Encounter: Payer: Self-pay | Admitting: Nurse Practitioner

## 2022-09-06 VITALS — BP 126/78 | HR 58 | Ht 65.0 in | Wt 139.6 lb

## 2022-09-06 DIAGNOSIS — Z79899 Other long term (current) drug therapy: Secondary | ICD-10-CM | POA: Diagnosis present

## 2022-09-06 DIAGNOSIS — I480 Paroxysmal atrial fibrillation: Secondary | ICD-10-CM | POA: Insufficient documentation

## 2022-09-06 DIAGNOSIS — Z7901 Long term (current) use of anticoagulants: Secondary | ICD-10-CM | POA: Insufficient documentation

## 2022-09-06 MED ORDER — DILTIAZEM HCL 30 MG PO TABS: ORAL_TABLET | ORAL | 6 refills | Status: DC

## 2022-09-06 MED ORDER — APIXABAN 5 MG PO TABS: 5.0000 mg | ORAL_TABLET | Freq: Two times a day (BID) | ORAL | 6 refills | Status: DC

## 2022-09-06 NOTE — Patient Instructions (Addendum)
Medication Instructions:  Your physician has recommended you make the following change in your medication:  START Eliquis 5 mg one tablet twice a day Continue all other medications as directed  Labwork: CBC, BET (due in 2 weeks (4/22) @ [facility])  Testing/Procedures: none  Follow-Up:  Your physician recommends that you schedule a follow-up appointment in: 1 month  Any Other Special Instructions Will Be Listed Below (If Applicable).  You have been referred to Electrophysiology   Please let us know IMMEDIATELY if you develop blood in your urine or stool.  If you need a refill on your cardiac medications before your next appointment, please call your pharmacy

## 2022-09-06 NOTE — Progress Notes (Unsigned)
Office Visit    Patient Name: Jessica Fowler Date of Encounter: 09/06/2022  PCP:  [name], MD   [facility]  Cardiologist:  [name], MD  Advanced Practice Provider:  Sharlene Dory, NP Electrophysiologist:  [name], MD   Chief Complaint    Jessica Fowler is a 77 y.o. female with a hx of PAF, s/p ablation in 2011, hyperlipidemia, and allergies, who presents today for initial evaluation.  Past Medical History    Past Medical History:  Diagnosis Date   Allergic rhinitis    Atrial fibrillation    s/p ablation 02/21/10   Hyperlipidemia    Pneumonia    S/P ablation of atrial flutter 02/21/2010   Past Surgical History:  Procedure Laterality Date   CARDIAC CATHETERIZATION  04/18/2009   Normal coronary arteries, EF 60%, 1+ MR   EP study  02/20/2010   CTI and PVI ablation by [facility]   implantable loop recorder removal  04/22/2021   MDT LINQ removed by Dr Johney Frame for RRT battery status   LOOP RECORDER INSERTION N/A 07/18/2017   Procedure: LOOP RECORDER INSERTION;  Surgeon: Hillis Range, MD;  [facility]: [facility];  Service: Cardiovascular;  Laterality: N/A;    Allergies  Allergies  Allergen Reactions   Pollen Extract Itching   Brimonidine Tartrate Rash    Other reaction(s): redness    History of Present Illness    Jessica Fowler is a 77 y.o. female with a PMH as mentioned above.  It appears patient has not seen general cardiology but has been followed by EP.  Last seen by Dr. Nelly Laurence on July 15, 2022.  Longstanding history of palpitations and A-fib that produced significant anxiety.  Had ILR implanted, revealed A-fib burden of less than 1% with short episodes of A-fib/A. tach.  At this office visit with EP, stated that her symptoms are getting worse, was recommended to have ILR replaced.  She recently contacted our office to be seen as she has been going in and out of A-fib for the past 2 months.  Went to the  ED, shortly went back to sinus rhythm.  Also notes some intermittent dizziness.  Today she presents for initial evaluation.  She states she has been having "spells of A-fib" for the past 2 weeks. States episodes last around all day, denies any episodes today. Cardizem helps some. Denies any chest pain, shortness of breath, syncope, presyncope, dizziness, orthopnea, PND, swelling or significant weight changes, acute bleeding, or claudication. When reviewing her medications, states she has never taken Eliquis and decided not to take it after reading about the medication.   EKGs/Labs/Other Studies Reviewed:   The following studies were reviewed today:  EKG:  EKG is not ordered today.    Echo 12/2020:  1. Left ventricular ejection fraction, by estimation, is 55 to 60%. The  left ventricle has normal function. The left ventricle has no regional  wall motion abnormalities. Left ventricular diastolic parameters were  normal. Normal global longitudinal  strain of -21.5%.   2. Right ventricular systolic function is normal. The right ventricular  size is normal. There is normal pulmonary artery systolic pressure. The  estimated right ventricular systolic pressure is 26.0 mmHg.   3. There is a trivial pericardial effusion posterior to the left  ventricle.   4. The mitral valve is grossly normal. Mild mitral valve regurgitation.   5. Tricuspid valve regurgitation is mild to moderate.   6. The aortic  valve is tricuspid. Aortic valve regurgitation is not  visualized.   7. The inferior vena cava is normal in size with greater than 50%  respiratory variability, suggesting right atrial pressure of 3 mmHg.   Comparison(s): Echocardiogram done 03/16/17 showed an EF of 55-60%.  Recent Labs: No results found for requested labs within last 365 days.  Recent Lipid Panel No results found for: "CHOL", "TRIG", "HDL", "CHOLHDL", "VLDL", "LDLCALC", "LDLDIRECT"  Risk Assessment/Calculations:   CHA2DS2-VASc  Score = 3  This indicates a 3.2% annual risk of stroke. The patient's score is based upon: CHF History: 0 HTN History: 0 Diabetes History: 0 Stroke History: 0 Vascular Disease History: 0 Age Score: 2 Gender Score: 1   Home Medications   Current Meds  Medication Sig   calcium-vitamin D (OSCAL WITH D) 500-200 MG-UNIT tablet Take 1 tablet by mouth 3 (three) times a week.    Cyanocobalamin (VITAMIN B-12 PO) Take 1 tablet by mouth daily.   dorzolamide-timolol (COSOPT) 22.3-6.8 MG/ML ophthalmic solution Place 1 drop into both eyes 2 (two) times daily.   fluticasone (FLONASE) 50 MCG/ACT nasal spray Place 2 sprays into both nostrils daily as needed for allergies.    latanoprost (XALATAN) 0.005 % ophthalmic solution Place 1 drop into both eyes at bedtime.    meclizine (ANTIVERT) 25 MG tablet Take 1 tablet by mouth as needed for dizziness.   metoprolol succinate (TOPROL-XL) 25 MG 24 hr tablet Take 25 mg by mouth every morning.   metoprolol tartrate (LOPRESSOR) 25 MG tablet Take 25 mg by mouth at bedtime.   nitroGLYCERIN (NITROSTAT) 0.4 MG SL tablet Place 1 tablet (0.4 mg total) under the tongue every 5 (five) minutes as needed for chest pain (MAX 3 TABLETS).   apixaban (ELIQUIS) 5 MG TABS tablet Take 1 tablet (5 mg total) by mouth 2 (two) times daily.   diltiazem (CARDIZEM) 30 MG tablet Take 1 tablet every 4 hours AS NEEDED for heart rate >100     Review of Systems    All other systems reviewed and are otherwise negative except as noted above.  Physical Exam    VS:  BP 126/78   Pulse (!) 58   Ht 5\' 5"  (1.651 m)   Wt 139 lb 9.6 oz (63.3 kg)   SpO2 98%   BMI 23.23 kg/m  , BMI Body mass index is 23.23 kg/m.  Wt Readings from Last 3 Encounters:  09/06/22 139 lb 9.6 oz (63.3 kg)  07/15/22 138 lb 3.2 oz (62.7 kg)  05/07/22 136 lb 12.8 oz (62.1 kg)     GEN: Thin, 77 y.o. female in no acute distress. HEENT: normal. Neck: Supple, no JVD, carotid bruits, or masses. Cardiac: S1/S2,  RRR, no murmurs, rubs, or gallops. No clubbing, cyanosis, edema.  Radials/PT 2+ and equal bilaterally.  Respiratory:  Respirations regular and unlabored, clear to auscultation bilaterally. MS: No deformity or atrophy. Skin: Warm and dry, no rash. Neuro:  Strength and sensation are intact. Psych: Normal affect.  Assessment & Plan    PAF, s/p ablation, chronic anticoagulation, medication management Denies any tachycardia or palpitations. She is in NSR on exam today, however, has noticed constant A-fib "spells" over the past 2 weeks. Closely followed by EP. Saw Dr. Nelly Laurence in 07/2022 who recommended ILR. Thorough discussion regarding monitoring devices, she declines zio monitor, recommended follow-up with EP regarding ILR insertion, will refer to EP. Continue Lopressor. Discussed stroke risk and stroke prophylaxis regarding CHA2DS2-VASc score 3. Wasn't taking Eliquis that was previously prescribed  due to concerns about medication. Discussed risks vs benefits of anticoagulation and patient is agreeable to start Eliquis 5 mg BID, will start Eliquis and in 2 weeks check CBC and BMET. Recommended Kardia mobile app. Heart healthy diet and regular cardiovascular exercise encouraged. Will refill Diltiazem per her request.  Disposition: Follow up in 1 month(s) with Vishnu P Mallipeddi, MD or APP.  Signed, Sharlene DoryElizabeth Jerol Rufener, NP 09/08/2022, 9:53 AM [facility]

## 2022-09-15 ENCOUNTER — Ambulatory Visit: Payer: Medicare Other | Admitting: Nurse Practitioner

## 2022-10-01 ENCOUNTER — Telehealth: Payer: Self-pay

## 2022-10-01 ENCOUNTER — Ambulatory Visit: Payer: Medicare Other | Admitting: Cardiovascular Disease

## 2022-10-01 NOTE — Telephone Encounter (Signed)
-----   Message from Sharlene Dory, NP sent at 09/29/2022 11:27 PM EDT ----- Labs look good.   Sharlene Dory, AGNP-C

## 2022-10-01 NOTE — Telephone Encounter (Signed)
Patient notified and verbalized understanding. Patient had no questions or concerns at this time. PCP copied 

## 2022-10-04 ENCOUNTER — Telehealth: Payer: Self-pay | Admitting: Internal Medicine

## 2022-10-04 NOTE — Telephone Encounter (Signed)
STAT if HR is under 50 or over 120 (normal HR is 60-100 beats per minute)  What is your heart rate? 58  Do you have a log of your heart rate readings (document readings)? No  Do you have any other symptoms? Pt states that HR has been low for the last 3 days. Pt says it has gotten as low as 40. She would like a callback regarding this matter

## 2022-10-04 NOTE — Telephone Encounter (Signed)
Spoke with patient who called requesting an appointment for low HR. Says she is using a pulse Ox and HR's are fluctuating between 70-110. Denies dizziness, chest pain or SOB. Medications reviewed. Gave first available appointment to see Mallipeddi 10/05/2022 and advised if she develops worsening symptoms, to go to the ED for an evaluation. Verbalized understanding of plan.

## 2022-10-05 ENCOUNTER — Encounter: Payer: Self-pay | Admitting: Internal Medicine

## 2022-10-05 ENCOUNTER — Ambulatory Visit: Payer: Medicare Other | Attending: Internal Medicine | Admitting: Internal Medicine

## 2022-10-05 VITALS — BP 114/68 | HR 75 | Ht 65.5 in | Wt 136.0 lb

## 2022-10-05 DIAGNOSIS — I480 Paroxysmal atrial fibrillation: Secondary | ICD-10-CM | POA: Insufficient documentation

## 2022-10-05 DIAGNOSIS — F419 Anxiety disorder, unspecified: Secondary | ICD-10-CM | POA: Diagnosis not present

## 2022-10-05 NOTE — Progress Notes (Signed)
Cardiology Office Note  Date: 10/05/2022   ID: Jessica Fowler 1945-07-30, MRN [id number]  PCP:  [name], MD  Cardiologist:  [name], MD Electrophysiologist:  [name], MD   Reason for Office Visit: Follow-up of A-fib   History of Present Illness: Jessica Fowler is a 77 y.o. female known to have paroxysmal A-fib status post ablation in 2011, HLD, nonobstructive CAD presents to the cardiology clinic for follow-up visit.  Patient had elevated heart rates of 170 to 180 bpm yesterday and had to call EMS.  By the time EMS arrived, she spontaneously converted to NSR.  Patient's daughter showed me the EMS strips that showed NSR. Patient had loop recorder in the past that was removed recently.  I am not sure if she has anxiety provoking palpitations from sinus tachycardia versus atrial fibrillation. No other symptoms of angina, syncope or leg swelling.  Past Medical History:  Diagnosis Date   Allergic rhinitis    Atrial fibrillation (HCC)    s/p ablation 02/21/10   Hyperlipidemia    Pneumonia    S/P ablation of atrial flutter 02/21/2010    Past Surgical History:  Procedure Laterality Date   CARDIAC CATHETERIZATION  04/18/2009   Normal coronary arteries, EF 60%, 1+ MR   EP study  02/20/2010   CTI and PVI ablation by [facility]   implantable loop recorder removal  04/22/2021   MDT LINQ removed by Dr Johney Frame for RRT battery status   LOOP RECORDER INSERTION N/A 07/18/2017   Procedure: LOOP RECORDER INSERTION;  Surgeon: Hillis Range, MD;  Location: [facility];  Service: Cardiovascular;  Laterality: N/A;    Current Outpatient Medications  Medication Sig Dispense Refill   calcium-vitamin D (OSCAL WITH D) 500-200 MG-UNIT tablet Take 1 tablet by mouth 3 (three) times a week.      Cyanocobalamin (VITAMIN B-12 PO) Take 1 tablet by mouth daily.     diltiazem (CARDIZEM) 30 MG tablet Take 1 tablet every 4 hours AS NEEDED for heart rate >100 30 tablet 6    dorzolamide-timolol (COSOPT) 22.3-6.8 MG/ML ophthalmic solution Place 1 drop into both eyes 2 (two) times daily.     fluticasone (FLONASE) 50 MCG/ACT nasal spray Place 2 sprays into both nostrils daily as needed for allergies.      latanoprost (XALATAN) 0.005 % ophthalmic solution Place 1 drop into both eyes at bedtime.      meclizine (ANTIVERT) 25 MG tablet Take 1 tablet by mouth as needed for dizziness.     metoprolol succinate (TOPROL-XL) 25 MG 24 hr tablet Take 25 mg by mouth every morning.     metoprolol tartrate (LOPRESSOR) 25 MG tablet Take 25 mg by mouth at bedtime.     nitroGLYCERIN (NITROSTAT) 0.4 MG SL tablet Place 1 tablet (0.4 mg total) under the tongue every 5 (five) minutes as needed for chest pain (MAX 3 TABLETS). 30 tablet 11   apixaban (ELIQUIS) 5 MG TABS tablet Take 1 tablet (5 mg total) by mouth 2 (two) times daily. (Patient not taking: Reported on 10/05/2022) 60 tablet 6   No current facility-administered medications for this visit.   Allergies:  Pollen extract and Brimonidine tartrate   Social History: The patient  reports that she has never smoked. She has never used smokeless tobacco. She reports that she does not drink alcohol and does not use drugs.   Family History: The patient's family history includes Colon cancer in her father; Heart failure in her  mother; Hyperthyroidism in her son; Hypothyroidism in her daughter.   ROS:  Please see the history of present illness. Otherwise, complete review of systems is positive for none  All other systems are reviewed and negative.   Physical Exam: VS:  BP 114/68   Pulse 75   Ht 5' 5.5" (1.664 m)   Wt 136 lb (61.7 kg)   SpO2 96%   BMI 22.29 kg/m , BMI Body mass index is 22.29 kg/m.  Wt Readings from Last 3 Encounters:  10/05/22 136 lb (61.7 kg)  09/06/22 139 lb 9.6 oz (63.3 kg)  07/15/22 138 lb 3.2 oz (62.7 kg)    General: Patient appears comfortable at rest. HEENT: Conjunctiva and lids normal, oropharynx clear with  moist mucosa. Neck: Supple, no elevated JVP or carotid bruits, no thyromegaly. Lungs: Clear to auscultation, nonlabored breathing at rest. Cardiac: Regular rate and rhythm, no S3 or significant systolic murmur, no pericardial rub. Abdomen: Soft, nontender, no hepatomegaly, bowel sounds present, no guarding or rebound. Extremities: No pitting edema, distal pulses 2+. Skin: Warm and dry. Musculoskeletal: No kyphosis. Neuropsychiatric: Alert and oriented x3, affect grossly appropriate.  Recent Labwork: No results found for requested labs within last 365 days.  No results found for: "CHOL", "TRIG", "HDL", "CHOLHDL", "VLDL", "LDLCALC", "LDLDIRECT"  Other Studies Reviewed Today:   Assessment and Plan: Patient is a 77 year old F known to have nonobstructive CAD, paroxysmal A-fib status post ablation in 2011, HLD is here for follow-up visit.  # Paroxysmal A-fib s/p ablation in 2011 # Anxiety -Patient called EMS yesterday for palpitation from HR 170 to 180 bpm. By the time he arrived, she spontaneously converted to NSR. I reviewed the EMS strips that showed NSR and no evidence of atrial fibrillation. She also reported having low heart rates, as low as 30 bpm on 2 occasions in the last few weeks. She checks HR with a pulse oximetry device. She reported worsening of symptoms recently. She was seen by EP in 07/2022 who recommended ILR placement. Patient is agreeable to ILR placement now. Will let EP know.  -EKG today showed NSR and all EKGs post ablation showed NSR. -Continue metoprolol. Continue diltiazem as needed for palpitations.  Not taking Eliquis due to concerns regarding the medication.   I have spent a total of 30 minutes with patient reviewing chart, EKGs, labs and examining patient as well as establishing an assessment and plan that was discussed with the patient.  > 50% of time was spent in direct patient care.    Medication Adjustments/Labs and Tests Ordered: Current medicines are  reviewed at length with the patient today.  Concerns regarding medicines are outlined above.   Tests Ordered: Orders Placed This Encounter  Procedures   EKG 12-Lead    Medication Changes: No orders of the defined types were placed in this encounter.   Disposition:  Follow up  one year  Signed Ysabela Keisler Verne Spurr, MD, 10/05/2022 2:56 PM    [facility] at [facility] [address] [place], [place], [place] [address]

## 2022-10-05 NOTE — Telephone Encounter (Signed)
HR was 170-180 on yesterday and she called EMS Reports taking her medications as prescribed and reviewed with patient. Request to keep visit for this morning with Mallipeddi.

## 2022-10-05 NOTE — Patient Instructions (Addendum)
Medication Instructions:  Your physician recommends that you continue on your current medications as directed. Please refer to the Current Medication list given to you today.  Labwork: none  Testing/Procedures: none  Follow-Up: Your physician recommends that you schedule a follow-up appointment in: 1 year. You will receive a reminder call in the mail in about 10 months reminding you to call and schedule your appointment. If you don't receive this call, please contact our office.  Any Other Special Instructions Will Be Listed Below (If Applicable). We are sending Dr. Nelly Laurence a message about your ILR.  If you need a refill on your cardiac medications before your next appointment, please call your pharmacy.

## 2022-10-26 ENCOUNTER — Ambulatory Visit: Payer: Medicare Other | Admitting: Internal Medicine

## 2022-10-26 ENCOUNTER — Ambulatory Visit: Payer: Medicare Other | Attending: Cardiovascular Disease | Admitting: Cardiovascular Disease

## 2022-10-26 ENCOUNTER — Encounter: Payer: Self-pay | Admitting: Cardiovascular Disease

## 2022-10-26 VITALS — BP 122/66 | HR 54 | Ht 65.0 in | Wt 141.2 lb

## 2022-10-26 DIAGNOSIS — I480 Paroxysmal atrial fibrillation: Secondary | ICD-10-CM | POA: Insufficient documentation

## 2022-10-26 NOTE — Progress Notes (Signed)
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  Since last being seen in our clinic, the patient reports doing reasonably well.    She continues to have occasional palpitations and afib which produce significant anxiety.    When I mentioned it would be helpful to replace the loop recorder, she mentioned that she didn't think it did anything because no one called her when she had a fast heart rate. She reports that she has been having episodes of rapid rates about every other day. She states that she has had to go to the ER several times.  07/15/2021 She presents today to review data from a wearable monitor. Unfortunately, she did not wear the monitor even though she received it in the mail.  10/26/2022 We discussed ILR placement at our last visit. Today, she has additional questions about the device and the point of monitoring. Today, she denies symptoms of chest pain, shortness of breath,  lower extremity edema, dizziness, presyncope, or syncope. She continues to have palpitations. The patient is otherwise without complaint today.   Past Medical History:  Diagnosis Date   Allergic rhinitis    Atrial fibrillation (HCC)    s/p ablation 02/21/10   Hyperlipidemia    Pneumonia    S/P ablation of atrial flutter 02/21/2010   Past Surgical History:  Procedure Laterality Date   CARDIAC CATHETERIZATION  04/18/2009   Normal coronary arteries, EF 60%, 1+ MR   EP study  02/20/2010   CTI and PVI ablation by [facility]   implantable loop recorder removal  04/22/2021   MDT LINQ removed by Dr Johney Frame for RRT battery status   LOOP RECORDER INSERTION N/A 07/18/2017   Procedure: LOOP RECORDER INSERTION;  Surgeon: Hillis Range, MD;  [facility];  Service: Cardiovascular;  Laterality: N/A;    ROS- all systems are reviewed and negatives except as per HPI above  Current Outpatient Medications   Medication Sig Dispense Refill   apixaban (ELIQUIS) 5 MG TABS tablet Take 1 tablet (5 mg total) by mouth 2 (two) times daily. (Patient not taking: Reported on 10/05/2022) 60 tablet 6   calcium-vitamin D (OSCAL WITH D) 500-200 MG-UNIT tablet Take 1 tablet by mouth 3 (three) times a week.      Cyanocobalamin (VITAMIN B-12 PO) Take 1 tablet by mouth daily.     diltiazem (CARDIZEM) 30 MG tablet Take 1 tablet every 4 hours AS NEEDED for heart rate >100 30 tablet 6   dorzolamide-timolol (COSOPT) 22.3-6.8 MG/ML ophthalmic solution Place 1 drop into both eyes 2 (two) times daily.     fluticasone (FLONASE) 50 MCG/ACT nasal spray Place 2 sprays into both nostrils daily as needed for allergies.      latanoprost (XALATAN) 0.005 % ophthalmic solution Place 1 drop into both eyes at bedtime.      meclizine (ANTIVERT) 25 MG tablet Take 1 tablet by mouth as needed for dizziness.     metoprolol succinate (TOPROL-XL) 25 MG 24 hr tablet Take 25 mg by mouth every morning.     metoprolol tartrate (LOPRESSOR) 25 MG tablet Take 25 mg by mouth at bedtime.     nitroGLYCERIN (NITROSTAT) 0.4 MG SL tablet Place 1 tablet (0.4 mg total) under the tongue every 5 (five) minutes as needed for chest pain (MAX 3 TABLETS). 30 tablet 11   No current facility-administered medications for this visit.  Physical Exam: Vitals:   10/26/22 1223  BP: 122/66  Pulse: (!) 54  SpO2: 98%  Weight: 141 lb 3.2 oz (64 kg)  Height: 5\' 5"  (1.651 m)     Gen: Appears comfortable, well-nourished CV: RRR, no dependent edema Pulm: breathing easily   Wt Readings from Last 3 Encounters:  10/26/22 141 lb 3.2 oz (64 kg)  10/05/22 136 lb (61.7 kg)  09/06/22 139 lb 9.6 oz (63.3 kg)   ECG - today sinus bradycardia, 54 bpm  Assessment and Plan:  Paroxysmal atrial fibrillation She has done very well s/p ablation 2011.   Continue eliquis   She has for years stated that her afib "is getting worse".  She is very anxious with palpitations.  She  had an ILR implanted which during similar symptoms revealed burden of <1% with short episodes of afib/ a tach. She reiterates that her symptoms are getting worse. I recommended replacement of ILR. She was not able to wear a monitor. She presents for ILR today.  I discussed with her the importance of indicating symptom episodes with her device.  2. Secondary hypercoagulable state: continue apixaban       PROCEDURES:   1. Implantable loop recorder implantation    INTRODUCTION:  KRYSTYL KIEPER presents with a history of palpitations The costs of loop recorder monitoring have been discussed with the patient.    DESCRIPTION OF PROCEDURE:  Informed written consent was obtained.  A timeout was performed. The patient required no sedation for the procedure today. The patients left chest was prepped and draped in the usual sterile fashion. The skin overlying the left parasternal region was infiltrated with lidocaine for local analgesia.  A 0.5-cm incision was made over the left parasternal region over the 3rd intercostal space.  A subcutaneous ILR pocket was fashioned using a combination of sharp and blunt dissection.  A [employer] Reveal LINQ 2 implantable loop recorder was then placed into the pocket  R waves were very prominent and measured >0.37mV.  Steri- Strips and a sterile dressing were then applied.  There were no early apparent complications.     CONCLUSIONS:   1. Successful implantation of a implantable loop recorder for a history of atrial fibrillation, atrial tachycardia, and palpitations.  2. No early apparent complications.   [name], MD  Cardiac Electrophysiology   10/26/2022 12:26 PM

## 2022-10-26 NOTE — Patient Instructions (Signed)
Medication Instructions:  Your physician recommends that you continue on your current medications as directed. Please refer to the Current Medication list given to you today.  Labwork: None ordered.  Testing/Procedures: None ordered.  Follow-Up:  Your physician wants you to follow-up in: 6 months with Dr York Pellant.  You will receive a reminder letter in the mail two months in advance. If you don't receive a letter, please call our office to schedule the follow-up appointment.    Implantable Loop Recorder Placement, Care After This sheet gives you information about how to care for yourself after your procedure. Your health care provider may also give you more specific instructions. If you have problems or questions, contact your health care provider. What can I expect after the procedure? After the procedure, it is common to have: Soreness or discomfort near the incision. Some swelling or bruising near the incision.  Follow these instructions at home: Incision care  Monitor your cardiac device site for redness, swelling, and drainage. Call the device clinic at [phone] if you experience these symptoms or fever/chills.  Keep the large square bandage on your site for 24 hours and then you may remove it yourself. Keep the steri-strips underneath in place.   You may shower after 72 hours / 3 days from your procedure with the steri-strips in place. They will usually fall off on their own, or may be removed after 10 days. Pat dry.   Avoid lotions, ointments, or perfumes over your incision until it is well-healed.  Please do not submerge in water until your site is completely healed.   Your device is MRI compatible.   Remote monitoring is used to monitor your cardiac device from home. This monitoring is scheduled every month by our office. It allows [place] to keep an eye on the function of your device to ensure it is working properly.  If your wound site starts to bleed apply  pressure.    For help with the monitor please call [employer] Monitor Support Specialist directly at [phone].    If you have any questions/concerns please call the device clinic at [phone].  Activity  Return to your normal activities.  General instructions Follow instructions from your health care provider about how to manage your implantable loop recorder and transmit the information. Learn how to activate a recording if this is necessary for your type of device. You may go through a metal detection gate, and you may let someone hold a metal detector over your chest. Show your ID card if needed. Do not have an MRI unless you check with your health care provider first. Take over-the-counter and prescription medicines only as told by your health care provider. Keep all follow-up visits as told by your health care provider. This is important. Contact a health care provider if: You have redness, swelling, or pain around your incision. You have a fever. You have pain that is not relieved by your pain medicine. You have triggered your device because of fainting (syncope) or because of a heartbeat that feels like it is racing, slow, fluttering, or skipping (palpitations). Get help right away if you have: Chest pain. Difficulty breathing. Summary After the procedure, it is common to have soreness or discomfort near the incision. Change your dressing as told by your health care provider. Follow instructions from your health care provider about how to manage your implantable loop recorder and transmit the information. Keep all follow-up visits as told by your health care provider. This is important. This  information is not intended to replace advice given to you by your health care provider. Make sure you discuss any questions you have with your health care provider. Document Released: 04/28/2015 Document Revised: 07/02/2017 Document Reviewed: 07/02/2017 Elsevier Patient Education   2020 [employer].

## 2022-11-05 LAB — LAB REPORT - SCANNED: EGFR: 71

## 2022-11-12 ENCOUNTER — Telehealth: Payer: Self-pay | Admitting: Internal Medicine

## 2022-11-12 NOTE — Telephone Encounter (Signed)
Patient stated she had an afib episode last Friday (6/7) and is concerned regarding follow-up care.

## 2022-11-15 ENCOUNTER — Encounter: Payer: Self-pay | Admitting: *Deleted

## 2022-11-15 NOTE — Telephone Encounter (Signed)
The patient has a linq 2. Her monitor does not send manual transmission. It sends automatically everyday. She has a transmission in [facility].

## 2022-11-15 NOTE — Telephone Encounter (Signed)
Reports on 11/05/2022 she was seen in ED at [facility] for A-fib. Sent request for records from [facility]. Denies a-fib episodes since 11/05/2022. Reports she is better now and has no symptoms. Patient is asking if her transmissions are sent automatically or manually. Will send to device clinic to give clear instructions for sending transmissions or if they are done automatically.

## 2022-11-16 NOTE — Telephone Encounter (Signed)
Spoke with patient informed her that the loop recorder in her chest stores all the data and when she gets around her remote monitor at night it sends data if if needs and sends auto updates. Reviewed patients transmission, patient  had multiple tachy episodes that correlated with her reports of palpitations on 11/05/2022   Episodes reviewed with Dr. Nelly Laurence, he recommended patient, stop taking the Metoprolol tartrate and increase the Metoprolol succinate to 50mg  at night, patient would like to discuss with Dr. Nelly Laurence before making this change as she feels that the tartrate is doing more to help with her heart than the succinate. Apt scheduled in [place] on 12/03/22 at 11:30

## 2022-11-16 NOTE — Telephone Encounter (Signed)
LVM for patient to call device clinic back, patient has LINQ 2, monitor updates nightly, updated this am 11/16/22, monitor is automatic.

## 2022-11-20 ENCOUNTER — Inpatient Hospital Stay (HOSPITAL_COMMUNITY)
Admission: EM | Admit: 2022-11-20 | Discharge: 2022-11-25 | DRG: 853 | Disposition: A | Discharge status: Home Health | Payer: Medicare Other | Attending: Internal Medicine | Admitting: Internal Medicine

## 2022-11-20 ENCOUNTER — Other Ambulatory Visit: Payer: Self-pay

## 2022-11-20 DIAGNOSIS — I10 Essential (primary) hypertension: Secondary | ICD-10-CM | POA: Diagnosis present

## 2022-11-20 DIAGNOSIS — Z888 Allergy status to other drugs, medicaments and biological substances status: Secondary | ICD-10-CM | POA: Diagnosis not present

## 2022-11-20 DIAGNOSIS — J302 Other seasonal allergic rhinitis: Secondary | ICD-10-CM | POA: Diagnosis not present

## 2022-11-20 DIAGNOSIS — Z8249 Family history of ischemic heart disease and other diseases of the circulatory system: Secondary | ICD-10-CM

## 2022-11-20 DIAGNOSIS — T45516A Underdosing of anticoagulants, initial encounter: Secondary | ICD-10-CM | POA: Diagnosis present

## 2022-11-20 DIAGNOSIS — Z91120 Patient's intentional underdosing of medication regimen due to financial hardship: Secondary | ICD-10-CM

## 2022-11-20 DIAGNOSIS — I480 Paroxysmal atrial fibrillation: Secondary | ICD-10-CM | POA: Diagnosis not present

## 2022-11-20 DIAGNOSIS — K3580 Unspecified acute appendicitis: Secondary | ICD-10-CM

## 2022-11-20 DIAGNOSIS — R188 Other ascites: Secondary | ICD-10-CM | POA: Diagnosis present

## 2022-11-20 DIAGNOSIS — E785 Hyperlipidemia, unspecified: Secondary | ICD-10-CM | POA: Diagnosis present

## 2022-11-20 DIAGNOSIS — I4891 Unspecified atrial fibrillation: Secondary | ICD-10-CM | POA: Diagnosis present

## 2022-11-20 DIAGNOSIS — Z79899 Other long term (current) drug therapy: Secondary | ICD-10-CM

## 2022-11-20 DIAGNOSIS — A419 Sepsis, unspecified organism: Secondary | ICD-10-CM | POA: Diagnosis not present

## 2022-11-20 DIAGNOSIS — Z7901 Long term (current) use of anticoagulants: Secondary | ICD-10-CM

## 2022-11-20 DIAGNOSIS — K3532 Acute appendicitis with perforation and localized peritonitis, without abscess: Secondary | ICD-10-CM | POA: Diagnosis present

## 2022-11-20 HISTORY — DX: Cardiac arrhythmia, unspecified: I49.9

## 2022-11-20 LAB — COMPREHENSIVE METABOLIC PANEL
ALT: 13 U/L (ref 0–44)
AST: 20 U/L (ref 15–41)
Albumin: 3 g/dL — ABNORMAL LOW (ref 3.5–5.0)
Alkaline Phosphatase: 87 U/L (ref 38–126)
Anion gap: 10 (ref 5–15)
BUN: 8 mg/dL (ref 8–23)
CO2: 18 mmol/L — ABNORMAL LOW (ref 22–32)
Calcium: 8.4 mg/dL — ABNORMAL LOW (ref 8.9–10.3)
Chloride: 108 mmol/L (ref 98–111)
Creatinine, Ser: 0.83 mg/dL (ref 0.44–1.00)
GFR, Estimated: >60 mL/min (ref >60)
Glucose, Bld: 134 mg/dL — ABNORMAL HIGH (ref 70–99)
Potassium: 3.1 mmol/L — ABNORMAL LOW (ref 3.5–5.1)
Sodium: 136 mmol/L (ref 135–145)
Total Bilirubin: 1.8 mg/dL — ABNORMAL HIGH (ref 0.3–1.2)
Total Protein: 5.9 g/dL — ABNORMAL LOW (ref 6.5–8.1)

## 2022-11-20 LAB — CBC WITH DIFFERENTIAL/PLATELET
Abs Immature Granulocytes: 0.08 10*3/uL — ABNORMAL HIGH (ref 0.00–0.07)
Basophils Absolute: 0 10*3/uL (ref 0.0–0.1)
Basophils Relative: 0 %
Eosinophils Absolute: 0.1 10*3/uL (ref 0.0–0.5)
Eosinophils Relative: 1 %
HCT: 38.1 % (ref 36.0–46.0)
Hemoglobin: 12.3 g/dL (ref 12.0–15.0)
Immature Granulocytes: 1 %
Lymphocytes Relative: 4 %
Lymphs Abs: 0.8 10*3/uL (ref 0.7–4.0)
MCH: 29.4 pg (ref 26.0–34.0)
MCHC: 32.3 g/dL (ref 30.0–36.0)
MCV: 91.1 fL (ref 80.0–100.0)
Monocytes Absolute: 0.6 10*3/uL (ref 0.1–1.0)
Monocytes Relative: 4 %
Neutro Abs: 16.1 10*3/uL — ABNORMAL HIGH (ref 1.7–7.7)
Neutrophils Relative %: 90 %
Platelets: 236 10*3/uL (ref 150–400)
RBC: 4.18 MIL/uL (ref 3.87–5.11)
RDW: 12.6 % (ref 11.5–15.5)
WBC: 17.7 10*3/uL — ABNORMAL HIGH (ref 4.0–10.5)
nRBC: 0 % (ref 0.0–0.2)

## 2022-11-20 LAB — I-STAT CHEM 8, ED
BUN: 7 mg/dL — ABNORMAL LOW (ref 8–23)
Calcium, Ion: 1.13 mmol/L — ABNORMAL LOW (ref 1.15–1.40)
Chloride: 107 mmol/L (ref 98–111)
Creatinine, Ser: 0.8 mg/dL (ref 0.44–1.00)
Glucose, Bld: 132 mg/dL — ABNORMAL HIGH (ref 70–99)
HCT: 37 % (ref 36.0–46.0)
Hemoglobin: 12.6 g/dL (ref 12.0–15.0)
Potassium: 3.2 mmol/L — ABNORMAL LOW (ref 3.5–5.1)
Sodium: 137 mmol/L (ref 135–145)
TCO2: 18 mmol/L — ABNORMAL LOW (ref 22–32)

## 2022-11-20 LAB — PROTIME-INR
INR: 1.4 — ABNORMAL HIGH (ref 0.8–1.2)
Prothrombin Time: 17.1 seconds — ABNORMAL HIGH (ref 11.4–15.2)

## 2022-11-20 LAB — LACTIC ACID, PLASMA: Lactic Acid, Venous: 2 mmol/L (ref 0.5–1.9)

## 2022-11-20 LAB — APTT: aPTT: 29 seconds (ref 24–36)

## 2022-11-20 NOTE — ED Provider Notes (Signed)
[place] EMERGENCY DEPARTMENT AT [facility] Provider Note   CSN: [id number] Arrival date & time: 11/20/22  2207     History  Chief Complaint  Patient presents with   Abdominal Pain R/O Appendicitis    Jessica Fowler is a 77 y.o. female who presents in transfer from [facility] for CT scan of the abdomen pelvis to rule out appendicitis.  The ED provider.  Upon review of her records from their facility patient presented with some confusion lower abdominal pain.  EKG with normal sinus rhythm patient has a history of atrial fibrillation.  Workup at this facility revealed leukocytosis of 20.5, lactic acid 1.7.  I do not have CT imaging available patient was transferred to our facility.  Prior to arrival she received Zosyn 4.5 g, Tylenol suppository, morphine and Zofran.  Patient was septic with fever 39.7 Celsius/23.4 Fahrenheit at arrival to the preceding facility.  At this time patient does seem somewhat disoriented to this provider, daughter at the bedside states that she has some episodes of confusion at baseline but that she has been more confused than normal today. Patient is supposed to be on eliquis for afib, but does not take it due to cost.   HPI     Home Medications Prior to Admission medications   Medication Sig Start Date End Date Taking? Authorizing Provider  calcium-vitamin D (OSCAL WITH D) 500-200 MG-UNIT tablet Take 1 tablet by mouth 3 (three) times a week.    Yes [provider]  Cyanocobalamin (VITAMIN B-12 PO) Take 1 tablet by mouth daily.   Yes [provider]  diltiazem (CARDIZEM) 30 MG tablet Take 1 tablet every 4 hours AS NEEDED for heart rate >100 09/06/22  Yes Sharlene Dory, NP  dorzolamide-timolol (COSOPT) 22.3-6.8 MG/ML ophthalmic solution Place 1 drop into both eyes 2 (two) times daily.   Yes [provider]  latanoprost (XALATAN) 0.005 % ophthalmic solution Place 1 drop into both eyes at bedtime.    Yes [provider]  metoprolol succinate (TOPROL-XL) 25 MG 24 hr tablet Take 25 mg by mouth every morning.   Yes [provider]  metoprolol tartrate (LOPRESSOR) 25 MG tablet Take 25 mg by mouth at bedtime.   Yes [provider]  apixaban (ELIQUIS) 5 MG TABS tablet Take 1 tablet (5 mg total) by mouth 2 (two) times daily. Patient not taking: Reported on 10/05/2022 09/06/22   Sharlene Dory, NP  nitroGLYCERIN (NITROSTAT) 0.4 MG SL tablet Place 1 tablet (0.4 mg total) under the tongue every 5 (five) minutes as needed for chest pain (MAX 3 TABLETS). Patient not taking: Reported on 11/21/2022 03/27/19   Sheilah Pigeon, PA-C      Allergies    Pollen extract and Brimonidine tartrate    Review of Systems   Review of Systems  Unable to perform ROS: Mental status change  Gastrointestinal:  Positive for abdominal pain and nausea.    Physical Exam Updated Vital Signs BP (!) 102/52   Pulse 78   Temp 98.2 F (36.8 C)   Resp 20   SpO2 98%  Physical Exam Vitals and nursing note reviewed.  Constitutional:      Appearance: She is not ill-appearing or toxic-appearing.  HENT:     Head: Normocephalic and atraumatic.     Mouth/Throat:     Mouth: Mucous membranes are moist.     Pharynx: No oropharyngeal exudate or posterior oropharyngeal erythema.  Eyes:     General:  Right eye: No discharge.        Left eye: No discharge.     Extraocular Movements: Extraocular movements intact.     Conjunctiva/sclera: Conjunctivae normal.     Pupils: Pupils are equal, round, and reactive to light.  Cardiovascular:     Rate and Rhythm: Normal rate and regular rhythm.     Pulses: Normal pulses.     Heart sounds: Normal heart sounds. No murmur heard. Pulmonary:     Effort: Pulmonary effort is normal. No respiratory distress.     Breath sounds: Normal breath sounds. No wheezing or rales.  Abdominal:     General: Bowel sounds are normal. There is no distension.     Palpations: Abdomen is  soft.     Tenderness: There is abdominal tenderness in the right lower quadrant, periumbilical area and suprapubic area. There is no right CVA tenderness, left CVA tenderness, guarding or rebound.  Musculoskeletal:        General: No deformity.     Cervical back: Neck supple.     Right lower leg: No edema.     Left lower leg: No edema.  Skin:    General: Skin is warm and dry.     Capillary Refill: Capillary refill takes less than 2 seconds.  Neurological:     General: No focal deficit present.     Mental Status: She is alert and oriented to person, place, and time. Mental status is at baseline.  Psychiatric:        Mood and Affect: Mood normal.     ED Results / Procedures / Treatments   Labs (all labs ordered are listed, but only abnormal results are displayed) Labs Reviewed  LACTIC ACID, PLASMA - Abnormal; Notable for the following components:      Result Value   Lactic Acid, Venous 2.0 (*)    All other components within normal limits  LACTIC ACID, PLASMA - Abnormal; Notable for the following components:   Lactic Acid, Venous 3.2 (*)    All other components within normal limits  COMPREHENSIVE METABOLIC PANEL - Abnormal; Notable for the following components:   Potassium 3.1 (*)    CO2 18 (*)    Glucose, Bld 134 (*)    Calcium 8.4 (*)    Total Protein 5.9 (*)    Albumin 3.0 (*)    Total Bilirubin 1.8 (*)    All other components within normal limits  CBC WITH DIFFERENTIAL/PLATELET - Abnormal; Notable for the following components:   WBC 17.7 (*)    Neutro Abs 16.1 (*)    Abs Immature Granulocytes 0.08 (*)    All other components within normal limits  PROTIME-INR - Abnormal; Notable for the following components:   Prothrombin Time 17.1 (*)    INR 1.4 (*)    All other components within normal limits  I-STAT CHEM 8, ED - Abnormal; Notable for the following components:   Potassium 3.2 (*)    BUN 7 (*)    Glucose, Bld 132 (*)    Calcium, Ion 1.13 (*)    TCO2 18 (*)    All  other components within normal limits  CULTURE, BLOOD (ROUTINE X 2)  CULTURE, BLOOD (ROUTINE X 2)  APTT  MAGNESIUM  LACTIC ACID, PLASMA  LACTIC ACID, PLASMA    EKG EKG Interpretation  Date/Time:  Saturday November 20 2022 22:17:57 EDT Ventricular Rate:  87 PR Interval:  136 QRS Duration: 92 QT Interval:  366 QTC Calculation: 441 R Axis:   -  35 Text Interpretation: Sinus rhythm Atrial premature complex Left axis deviation Low voltage, precordial leads Consider anterior infarct Borderline ST depression, anterolateral leads , new since last tracing Confirmed by Linwood Dibbles [phone]) on 11/20/2022 10:21:54 PM  Radiology CT ABDOMEN PELVIS W CONTRAST  Result Date: 11/21/2022 CLINICAL DATA:  RLQ abdominal pain EXAM: CT ABDOMEN AND PELVIS WITH CONTRAST TECHNIQUE: Multidetector CT imaging of the abdomen and pelvis was performed using the standard protocol following bolus administration of intravenous contrast. RADIATION DOSE REDUCTION: This exam was performed according to the departmental dose-optimization program which includes automated exposure control, adjustment of the mA and/or kV according to patient size and/or use of iterative reconstruction technique. CONTRAST:  75mL OMNIPAQUE IOHEXOL 350 MG/ML SOLN COMPARISON:  None Available. FINDINGS: Lower chest: Left base atelectasis. Hepatobiliary: No focal liver abnormality. Status post cholecystectomy. No biliary dilatation. Pneumobilia. Pancreas: Diffusely atrophic. No focal lesion. Otherwise normal pancreatic contour. No surrounding inflammatory changes. No main pancreatic ductal dilatation. Spleen: Normal in size without focal abnormality. Adrenals/Urinary Tract: No adrenal nodule bilaterally. Bilateral kidneys enhance symmetrically. No hydronephrosis. No hydroureter. Subcentimeter hypodensities too small To characterize. The urinary bladder is unremarkable. On delayed imaging, there is no urothelial wall thickening and there are no filling defects in the  opacified portions of the bilateral collecting systems or ureters. Stomach/Bowel: Possible small bowel malrotation with no volvulus. Stomach is within normal limits. No evidence of bowel wall thickening or dilatation. Reactive changes of the small bowel within the right lower quadrant. Right lower quadrant inflammatory changes with fat stranding and trace free fluid. Question inflamed appendix (6:26) best seen on coronal with possible appendiceal wall discontinuity. Question appendicolith. Vascular/Lymphatic: Bilateral phleboliths along the ovaries. Stomach is within normal limits. No evidence of bowel wall thickening or dilatation. Appendix appears normal. Reproductive: Uterus and bilateral adnexa are unremarkable. Other: No intraperitoneal free fluid. No intraperitoneal free gas. No organized fluid collection. Musculoskeletal: No abdominal wall hernia or abnormality. No suspicious lytic or blastic osseous lesions. No acute displaced fracture. Multilevel degenerative changes of the spine. IMPRESSION: 1. Acute appendicitis-likely ruptured. Question appendicoliths. No abscess formation. 2. Colonic diverticulosis with no acute diverticulitis. Electronically Signed   By: Tish Frederickson M.D.   On: 11/21/2022 00:43    Procedures .Critical Care  Performed by: Paris Lore, PA-C Authorized by: Paris Lore, PA-C   Critical care provider statement:    Critical care time (minutes):  45   Critical care was time spent personally by me on the following activities:  Development of treatment plan with patient or surrogate, discussions with consultants, evaluation of patient's response to treatment, examination of patient, obtaining history from patient or surrogate, ordering and performing treatments and interventions, ordering and review of laboratory studies, ordering and review of radiographic studies, pulse oximetry and re-evaluation of patient's condition     Medications Ordered in  ED Medications  diltiazem (CARDIZEM) 1 mg/mL load via infusion 10 mg (10 mg Intravenous Bolus from Bag 11/21/22 0131)    And  diltiazem (CARDIZEM) 125 mg in dextrose 5% 125 mL (1 mg/mL) infusion (15 mg/hr Intravenous Rate/Dose Change 11/21/22 0149)  lactated ringers infusion ( Intravenous New Bag/Given 11/21/22 0351)  fentaNYL (SUBLIMAZE) injection 50 mcg (has no administration in time range)  lactated ringers bolus 1,000 mL (0 mLs Intravenous Stopped 11/21/22 0122)  iohexol (OMNIPAQUE) 350 MG/ML injection 75 mL (75 mLs Intravenous Contrast Given 11/21/22 0021)  lactated ringers bolus 500 mL (0 mLs Intravenous Stopped 11/21/22 0432)    ED Course/ Medical Decision  Making/ A&P Clinical Course as of 11/21/22 8299  Wynelle Link Nov 21, 2022  0106 I evaluated patient at bedside, patient now in A-fib with RVR, diltiazem ordered.  Consult called to Dr. Sheliah Hatch received, he will consult on the patient but requests that she be admitted to medicine due to A-fib with RVR.  Appreciate his collaboration with this patient. [RS]  0201 Consult to Dr. Janalyn Shy, hospital medicine, who states that the patient will likely be a carryover until the morning team.  [RS]  0514 Good response to diltiazem., patient now resting comfortably with heart rate in the 70s, borderline blood pressure with systolic blood pressure around 100.  Maintenance fluids ordered.  IV analgesia ordered.  Patient still awaiting hospitalist consult.  [RS]    Clinical Course User Index [RS] Tykesha Konicki, Eugene Gavia, PA-C                             Medical Decision Making 77 year old female who presented with septic presentation of abdominal pain to outside ED now and transferred to our facility for CT imaging.  Mildly tensive with systolic blood pressure in the 90s in our facility, fever has resolved following Tylenol suppository at preceding ED.    Ddx includes but is not limited to appendicitis, diverticulitis, mesenteric adenitis, mesenteric ischemia,  psoas abscess, cholelithiasis/choledocholithiasis.   Amount and/or Complexity of Data Reviewed Labs: ordered.    Details: CBC with leukocytosis 17.7, CMP with hypokalemia of 3.1, elevated total bilirubin 1.8.  Creatinine is normal at 0.8.  INR elevated to 1.4.  Lactic acid elevated to 2, blood cultures pending. Radiology: ordered.    Details: CT with ruptured appendicitis, I have personally reviewed.   Risk Prescription drug management. Decision regarding hospitalization.   Patient with onset of afib with RVR, dilt ordered.  Good response to diltiazem.  Patient admitted to hospital medicine service.  General surgery consulting.  Jessica Fowler and her daughter  voiced understanding of her medical evaluation and treatment plan. Each of their questions answered to their expressed satisfaction. They are amenable to plan for admission at this time.   This chart was dictated using voice recognition software, Dragon. Despite the best efforts of this provider to proofread and correct errors, errors may still occur which can change documentation meaning.     Final Clinical Impression(s) / ED Diagnoses Final diagnoses:  Atrial fibrillation with rapid ventricular response (HCC)  Ruptured appendicitis    Rx / DC Orders ED Discharge Orders          Ordered    Amb referral to AFIB Clinic        11/21/22 0108              Khamauri Bauernfeind, Eugene Gavia, PA-C 11/21/22 3716    Linwood Dibbles, MD 11/21/22 [phone]

## 2022-11-20 NOTE — ED Triage Notes (Signed)
Patient arrived with [facility] from [facility] , sent here for CT scan of abdomen to rule out appendicitis , pain across lower abdomen for several days with fever/leukocytosis , received Zosyn IV antibiotic this evening prior to arrival .

## 2022-11-20 NOTE — ED Provider Notes (Incomplete)
[place] EMERGENCY DEPARTMENT AT [facility] Provider Note   CSN: [id number] Arrival date & time: 11/20/22  2207     History {Add pertinent medical, surgical, social history, OB history to HPI:1} Chief Complaint  Patient presents with  . Abdominal Pain R/O Appendicitis    Jessica Fowler is a 77 y.o. female who presents in transfer from [facility] for CT scan of the abdomen pelvis to rule out appendicitis.  The ED provider.  Upon review of her records from their facility patient presented with some confusion lower abdominal pain.  EKG with normal sinus rhythm patient has a history of atrial fibrillation.  Workup at this facility revealed leukocytosis of 20.5, lactic acid 1.7.  I do not have CT imaging available patient was transferred to our facility.  Prior to arrival she received Zosyn 4.5 g, Tylenol suppository, morphine and Zofran.  Patient was septic with fever 39.7 Celsius/23.4 Fahrenheit at arrival to the preceding facility.  At this time patient does seem somewhat disoriented to this provider, daughter at the bedside states that she has some episodes of confusion at baseline but that she has been more confused than normal today.  Question of possible anticoagulation with Eliquis, will confirm with patient's family once they arrive at the bedside***.  HPI     Home Medications Prior to Admission medications   Medication Sig Start Date End Date Taking? Authorizing Provider  apixaban (ELIQUIS) 5 MG TABS tablet Take 1 tablet (5 mg total) by mouth 2 (two) times daily. Patient not taking: Reported on 10/05/2022 09/06/22   Sharlene Dory, NP  calcium-vitamin D (OSCAL WITH D) 500-200 MG-UNIT tablet Take 1 tablet by mouth 3 (three) times a week.     [provider]  Cyanocobalamin (VITAMIN B-12 PO) Take 1 tablet by mouth daily.    [provider]  diltiazem (CARDIZEM) 30 MG tablet Take 1 tablet every 4 hours AS NEEDED for heart rate >100 09/06/22   Sharlene Dory, NP  dorzolamide-timolol (COSOPT) 22.3-6.8 MG/ML ophthalmic solution Place 1 drop into both eyes 2 (two) times daily.    [provider]  fluticasone (FLONASE) 50 MCG/ACT nasal spray Place 2 sprays into both nostrils daily as needed for allergies.  09/09/15   [provider]  latanoprost (XALATAN) 0.005 % ophthalmic solution Place 1 drop into both eyes at bedtime.     [provider]  meclizine (ANTIVERT) 25 MG tablet Take 1 tablet by mouth as needed for dizziness. 11/03/20   [provider]  metoprolol succinate (TOPROL-XL) 25 MG 24 hr tablet Take 25 mg by mouth every morning.    [provider]  metoprolol tartrate (LOPRESSOR) 25 MG tablet Take 25 mg by mouth at bedtime.    [provider]  nitroGLYCERIN (NITROSTAT) 0.4 MG SL tablet Place 1 tablet (0.4 mg total) under the tongue every 5 (five) minutes as needed for chest pain (MAX 3 TABLETS). 03/27/19   Sheilah Pigeon, PA-C      Allergies    Pollen extract and Brimonidine tartrate    Review of Systems   Review of Systems  Unable to perform ROS: Mental status change  Gastrointestinal:  Positive for abdominal pain and nausea.    Physical Exam Updated Vital Signs There were no vitals taken for this visit. Physical Exam Vitals and nursing note reviewed.  Constitutional:      Appearance: She is not ill-appearing or toxic-appearing.  HENT:     Head: Normocephalic and atraumatic.  Mouth/Throat:     Mouth: Mucous membranes are moist.     Pharynx: No oropharyngeal exudate or posterior oropharyngeal erythema.  Eyes:     General:        Right eye: No discharge.        Left eye: No discharge.     Extraocular Movements: Extraocular movements intact.     Conjunctiva/sclera: Conjunctivae normal.     Pupils: Pupils are equal, round, and reactive to light.  Cardiovascular:     Rate and Rhythm: Normal rate and regular rhythm.     Pulses: Normal pulses.     Heart sounds:  Normal heart sounds. No murmur heard. Pulmonary:     Effort: Pulmonary effort is normal. No respiratory distress.     Breath sounds: Normal breath sounds. No wheezing or rales.  Abdominal:     General: Bowel sounds are normal. There is no distension.     Palpations: Abdomen is soft.     Tenderness: There is abdominal tenderness in the right lower quadrant, periumbilical area and suprapubic area. There is no right CVA tenderness, left CVA tenderness, guarding or rebound.  Musculoskeletal:        General: No deformity.     Cervical back: Neck supple.     Right lower leg: No edema.     Left lower leg: No edema.  Skin:    General: Skin is warm and dry.     Capillary Refill: Capillary refill takes less than 2 seconds.  Neurological:     General: No focal deficit present.     Mental Status: She is alert and oriented to person, place, and time. Mental status is at baseline.  Psychiatric:        Mood and Affect: Mood normal.     ED Results / Procedures / Treatments   Labs (all labs ordered are listed, but only abnormal results are displayed) Labs Reviewed - No data to display  EKG None  Radiology No results found.  Procedures Procedures  {Document cardiac monitor, telemetry assessment procedure when appropriate:1}  Medications Ordered in ED Medications - No data to display  ED Course/ Medical Decision Making/ A&P   {   Click here for ABCD2, HEART and other calculatorsREFRESH Note before signing :1}                          Medical Decision Making 77 year old female who presented with septic presentation of abdominal pain to outside ED now and transferred to our facility for CT imaging.  Mildly tensive with systolic blood pressure in the 90s in our facility, fever has resolved following Tylenol suppository at preceding ED.    Ddx includes but is not limited to appendicitis, diverticulitis, mesenteric adenitis, mesenteric ischemia, psoas abscess,  cholelithiasis/choledocholithiasis.   Amount and/or Complexity of Data Reviewed Labs: ordered.    Details: CBC with leukocytosis 17.7, CMP with hypokalemia of 3.1, elevated total bilirubin 1.8.  Creatinine is normal at 0.8.  INR elevated to 1.4.  Lactic acid elevated to 2, blood cultures pending. Radiology: ordered.   ***  {Document critical care time when appropriate:1} {Document review of labs and clinical decision tools ie heart score, Chads2Vasc2 etc:1}  {Document your independent review of radiology images, and any outside records:1} {Document your discussion with family members, caretakers, and with consultants:1} {Document social determinants of health affecting pt's care:1} {Document your decision making why or why not admission, treatments were needed:1} Final Clinical Impression(s) / ED Diagnoses  Final diagnoses:  None    Rx / DC Orders ED Discharge Orders     None

## 2022-11-21 ENCOUNTER — Encounter (HOSPITAL_COMMUNITY): Payer: Self-pay | Admitting: Family Medicine

## 2022-11-21 ENCOUNTER — Emergency Department (HOSPITAL_COMMUNITY): Payer: Medicare Other | Admitting: Anesthesiology

## 2022-11-21 ENCOUNTER — Encounter (HOSPITAL_COMMUNITY)
Admission: EM | Disposition: A | Discharge status: Home Health | Payer: Self-pay | Source: Home / Self Care | Attending: Internal Medicine

## 2022-11-21 ENCOUNTER — Emergency Department (HOSPITAL_COMMUNITY): Payer: Medicare Other

## 2022-11-21 ENCOUNTER — Other Ambulatory Visit: Payer: Self-pay

## 2022-11-21 DIAGNOSIS — I4891 Unspecified atrial fibrillation: Secondary | ICD-10-CM | POA: Diagnosis not present

## 2022-11-21 DIAGNOSIS — Z7901 Long term (current) use of anticoagulants: Secondary | ICD-10-CM | POA: Diagnosis not present

## 2022-11-21 DIAGNOSIS — K3532 Acute appendicitis with perforation and localized peritonitis, without abscess: Secondary | ICD-10-CM

## 2022-11-21 DIAGNOSIS — J302 Other seasonal allergic rhinitis: Secondary | ICD-10-CM | POA: Diagnosis not present

## 2022-11-21 DIAGNOSIS — K352 Acute appendicitis with generalized peritonitis, without perforation or abscess: Secondary | ICD-10-CM

## 2022-11-21 DIAGNOSIS — I081 Rheumatic disorders of both mitral and tricuspid valves: Secondary | ICD-10-CM

## 2022-11-21 DIAGNOSIS — I2510 Atherosclerotic heart disease of native coronary artery without angina pectoris: Secondary | ICD-10-CM

## 2022-11-21 DIAGNOSIS — Z91120 Patient's intentional underdosing of medication regimen due to financial hardship: Secondary | ICD-10-CM | POA: Diagnosis not present

## 2022-11-21 DIAGNOSIS — Z79899 Other long term (current) drug therapy: Secondary | ICD-10-CM | POA: Diagnosis not present

## 2022-11-21 DIAGNOSIS — Z888 Allergy status to other drugs, medicaments and biological substances status: Secondary | ICD-10-CM | POA: Diagnosis not present

## 2022-11-21 DIAGNOSIS — I480 Paroxysmal atrial fibrillation: Secondary | ICD-10-CM | POA: Diagnosis not present

## 2022-11-21 DIAGNOSIS — A419 Sepsis, unspecified organism: Secondary | ICD-10-CM | POA: Diagnosis not present

## 2022-11-21 DIAGNOSIS — T45516A Underdosing of anticoagulants, initial encounter: Secondary | ICD-10-CM | POA: Diagnosis not present

## 2022-11-21 DIAGNOSIS — I10 Essential (primary) hypertension: Secondary | ICD-10-CM | POA: Diagnosis not present

## 2022-11-21 DIAGNOSIS — E785 Hyperlipidemia, unspecified: Secondary | ICD-10-CM | POA: Diagnosis not present

## 2022-11-21 DIAGNOSIS — R188 Other ascites: Secondary | ICD-10-CM | POA: Diagnosis not present

## 2022-11-21 DIAGNOSIS — Z8249 Family history of ischemic heart disease and other diseases of the circulatory system: Secondary | ICD-10-CM | POA: Diagnosis not present

## 2022-11-21 DIAGNOSIS — K3580 Unspecified acute appendicitis: Secondary | ICD-10-CM | POA: Diagnosis present

## 2022-11-21 HISTORY — PX: LAPAROSCOPIC APPENDECTOMY: SHX408

## 2022-11-21 LAB — GLUCOSE, CAPILLARY
Glucose-Capillary: 214 mg/dL — ABNORMAL HIGH (ref 70–99)
Glucose-Capillary: 188 mg/dL — ABNORMAL HIGH (ref 70–99)

## 2022-11-21 LAB — BASIC METABOLIC PANEL
Anion gap: 8 (ref 5–15)
BUN: 6 mg/dL — ABNORMAL LOW (ref 8–23)
CO2: 19 mmol/L — ABNORMAL LOW (ref 22–32)
Calcium: 8.4 mg/dL — ABNORMAL LOW (ref 8.9–10.3)
Chloride: 107 mmol/L (ref 98–111)
Creatinine, Ser: 0.74 mg/dL (ref 0.44–1.00)
GFR, Estimated: >60 mL/min (ref >60)
Glucose, Bld: 212 mg/dL — ABNORMAL HIGH (ref 70–99)
Potassium: 3.6 mmol/L (ref 3.5–5.1)
Sodium: 134 mmol/L — ABNORMAL LOW (ref 135–145)

## 2022-11-21 LAB — LACTIC ACID, PLASMA
Lactic Acid, Venous: 3.2 mmol/L (ref 0.5–1.9)
Lactic Acid, Venous: 1.7 mmol/L (ref 0.5–1.9)

## 2022-11-21 LAB — CULTURE, BLOOD (ROUTINE X 2): Culture: NO GROWTH

## 2022-11-21 LAB — MAGNESIUM
Magnesium: 1.7 mg/dL (ref 1.7–2.4)
Magnesium: 2 mg/dL (ref 1.7–2.4)

## 2022-11-21 LAB — CBG MONITORING, ED: Glucose-Capillary: 131 mg/dL — ABNORMAL HIGH (ref 70–99)

## 2022-11-21 SURGERY — APPENDECTOMY, LAPAROSCOPIC
Anesthesia: General | Site: Abdomen

## 2022-11-21 MED ORDER — ONDANSETRON HCL 4 MG PO TABS: 4.0000 mg | ORAL_TABLET | Freq: Four times a day (QID) | ORAL | Status: DC | PRN

## 2022-11-21 MED ORDER — POTASSIUM CHLORIDE 10 MEQ/100ML IV SOLN
10.0000 meq | INTRAVENOUS | Status: AC
  Administered 2022-11-22 (×3): 10 meq via INTRAVENOUS
  Filled 2022-11-21 (×3): qty 100

## 2022-11-21 MED ORDER — ONDANSETRON 4 MG PO TBDP: 4.0000 mg | ORAL_TABLET | Freq: Four times a day (QID) | ORAL | Status: DC | PRN

## 2022-11-21 MED ORDER — LACTATED RINGERS IV SOLN: INTRAVENOUS | Status: DC

## 2022-11-21 MED ORDER — MORPHINE SULFATE (PF) 2 MG/ML IV SOLN
1.0000 mg | INTRAVENOUS | Status: DC | PRN
  Administered 2022-11-21: 1 mg via INTRAVENOUS
  Filled 2022-11-21: qty 1

## 2022-11-21 MED ORDER — DILTIAZEM HCL-DEXTROSE 125-5 MG/125ML-% IV SOLN (PREMIX)
5.0000 mg/h | INTRAVENOUS | Status: AC
  Administered 2022-11-21: 15 mg/h via INTRAVENOUS
  Administered 2022-11-21: 5 mg/h via INTRAVENOUS
  Administered 2022-11-21: 10 mg/h via INTRAVENOUS
  Filled 2022-11-21 (×4): qty 125

## 2022-11-21 MED ORDER — DILTIAZEM LOAD VIA INFUSION
10.0000 mg | Freq: Once | INTRAVENOUS | Status: AC
  Administered 2022-11-21: 10 mg via INTRAVENOUS
  Filled 2022-11-21: qty 10

## 2022-11-21 MED ORDER — FENTANYL CITRATE (PF) 100 MCG/2ML IJ SOLN
INTRAMUSCULAR | Status: AC
  Filled 2022-11-21: qty 2

## 2022-11-21 MED ORDER — BUPIVACAINE HCL (PF) 0.25 % IJ SOLN
INTRAMUSCULAR | Status: AC
  Filled 2022-11-21: qty 30

## 2022-11-21 MED ORDER — DEXTROSE-SODIUM CHLORIDE 5-0.9 % IV SOLN: INTRAVENOUS | Status: DC

## 2022-11-21 MED ORDER — METOPROLOL SUCCINATE ER 25 MG PO TB24
25.0000 mg | ORAL_TABLET | Freq: Every morning | ORAL | Status: DC
  Administered 2022-11-21 – 2022-11-25 (×5): 25 mg via ORAL
  Filled 2022-11-21 (×5): qty 1

## 2022-11-21 MED ORDER — ENOXAPARIN SODIUM 40 MG/0.4ML IJ SOSY
40.0000 mg | PREFILLED_SYRINGE | INTRAMUSCULAR | Status: DC
  Administered 2022-11-22 – 2022-11-25 (×4): 40 mg via SUBCUTANEOUS
  Filled 2022-11-21 (×4): qty 0.4

## 2022-11-21 MED ORDER — ONDANSETRON HCL 4 MG/2ML IJ SOLN
INTRAMUSCULAR | Status: AC
  Filled 2022-11-21: qty 2

## 2022-11-21 MED ORDER — SUCCINYLCHOLINE CHLORIDE 200 MG/10ML IV SOSY
PREFILLED_SYRINGE | INTRAVENOUS | Status: AC
  Filled 2022-11-21: qty 20

## 2022-11-21 MED ORDER — SODIUM CHLORIDE 0.9 % IR SOLN
Status: DC | PRN
  Administered 2022-11-21: 1000 mL

## 2022-11-21 MED ORDER — DEXAMETHASONE SODIUM PHOSPHATE 10 MG/ML IJ SOLN
INTRAMUSCULAR | Status: DC | PRN
  Administered 2022-11-21: 4 mg via INTRAVENOUS

## 2022-11-21 MED ORDER — METOPROLOL SUCCINATE ER 25 MG PO TB24: 25.0000 mg | ORAL_TABLET | Freq: Every morning | ORAL | Status: DC

## 2022-11-21 MED ORDER — CHLORHEXIDINE GLUCONATE 0.12 % MT SOLN
OROMUCOSAL | Status: AC
  Filled 2022-11-21: qty 15

## 2022-11-21 MED ORDER — ONDANSETRON HCL 4 MG/2ML IJ SOLN: 4.0000 mg | Freq: Four times a day (QID) | INTRAMUSCULAR | Status: DC | PRN

## 2022-11-21 MED ORDER — PROPOFOL 10 MG/ML IV BOLUS
INTRAVENOUS | Status: DC | PRN
  Administered 2022-11-21: 60 mg via INTRAVENOUS

## 2022-11-21 MED ORDER — MAGNESIUM SULFATE IN D5W 1-5 GM/100ML-% IV SOLN
1.0000 g | Freq: Once | INTRAVENOUS | Status: AC
  Administered 2022-11-21: 1 g via INTRAVENOUS
  Filled 2022-11-21: qty 100

## 2022-11-21 MED ORDER — METOPROLOL TARTRATE 5 MG/5ML IV SOLN
2.5000 mg | Freq: Once | INTRAVENOUS | Status: AC
  Administered 2022-11-21: 2.5 mg via INTRAVENOUS
  Filled 2022-11-21: qty 5

## 2022-11-21 MED ORDER — FENTANYL CITRATE PF 50 MCG/ML IJ SOSY
50.0000 ug | PREFILLED_SYRINGE | Freq: Once | INTRAMUSCULAR | Status: AC
  Administered 2022-11-21: 50 ug via INTRAVENOUS
  Filled 2022-11-21: qty 1

## 2022-11-21 MED ORDER — LACTATED RINGERS IV BOLUS
500.0000 mL | Freq: Once | INTRAVENOUS | Status: AC
  Administered 2022-11-21: 500 mL via INTRAVENOUS

## 2022-11-21 MED ORDER — ONDANSETRON HCL 4 MG/2ML IJ SOLN
INTRAMUSCULAR | Status: DC | PRN
  Administered 2022-11-21: 4 mg via INTRAVENOUS

## 2022-11-21 MED ORDER — IOHEXOL 350 MG/ML SOLN
75.0000 mL | Freq: Once | INTRAVENOUS | Status: AC | PRN
  Administered 2022-11-21: 75 mL via INTRAVENOUS

## 2022-11-21 MED ORDER — CHLORHEXIDINE GLUCONATE 0.12 % MT SOLN
15.0000 mL | Freq: Once | OROMUCOSAL | Status: AC
  Administered 2022-11-21: 15 mL via OROMUCOSAL

## 2022-11-21 MED ORDER — LACTATED RINGERS IV BOLUS
1000.0000 mL | Freq: Once | INTRAVENOUS | Status: AC
  Administered 2022-11-21: 1000 mL via INTRAVENOUS

## 2022-11-21 MED ORDER — ACETAMINOPHEN 650 MG RE SUPP: 650.0000 mg | Freq: Four times a day (QID) | RECTAL | Status: DC | PRN

## 2022-11-21 MED ORDER — POLYETHYLENE GLYCOL 3350 17 G PO PACK
17.0000 g | PACK | Freq: Every day | ORAL | Status: DC | PRN
  Administered 2022-11-23: 17 g via ORAL
  Filled 2022-11-21: qty 1

## 2022-11-21 MED ORDER — ACETAMINOPHEN 500 MG PO TABS: 1000.0000 mg | ORAL_TABLET | Freq: Once | ORAL | Status: DC | PRN

## 2022-11-21 MED ORDER — PIPERACILLIN-TAZOBACTAM 3.375 G IVPB
3.3750 g | Freq: Three times a day (TID) | INTRAVENOUS | Status: DC
  Administered 2022-11-21 – 2022-11-25 (×12): 3.375 g via INTRAVENOUS
  Filled 2022-11-21 (×12): qty 50

## 2022-11-21 MED ORDER — FENTANYL CITRATE (PF) 100 MCG/2ML IJ SOLN
25.0000 ug | INTRAMUSCULAR | Status: DC | PRN
  Administered 2022-11-21: 25 ug via INTRAVENOUS

## 2022-11-21 MED ORDER — ROCURONIUM BROMIDE 10 MG/ML (PF) SYRINGE
PREFILLED_SYRINGE | INTRAVENOUS | Status: DC | PRN
  Administered 2022-11-21: 60 mg via INTRAVENOUS

## 2022-11-21 MED ORDER — SODIUM CHLORIDE 0.9 % IV SOLN
2.0000 g | INTRAVENOUS | Status: DC
  Administered 2022-11-21: 2 g via INTRAVENOUS
  Filled 2022-11-21: qty 20

## 2022-11-21 MED ORDER — ROCURONIUM BROMIDE 10 MG/ML (PF) SYRINGE
PREFILLED_SYRINGE | INTRAVENOUS | Status: AC
  Filled 2022-11-21: qty 10

## 2022-11-21 MED ORDER — ACETAMINOPHEN 10 MG/ML IV SOLN: 1000.0000 mg | Freq: Once | INTRAVENOUS | Status: DC | PRN

## 2022-11-21 MED ORDER — MAGNESIUM SULFATE 2 GM/50ML IV SOLN
2.0000 g | Freq: Once | INTRAVENOUS | Status: AC
  Administered 2022-11-22: 2 g via INTRAVENOUS
  Filled 2022-11-21: qty 50

## 2022-11-21 MED ORDER — POTASSIUM CHLORIDE 2 MEQ/ML IV SOLN
INTRAVENOUS | Status: DC
  Filled 2022-11-21 (×2): qty 1000

## 2022-11-21 MED ORDER — TRAMADOL HCL 50 MG PO TABS
50.0000 mg | ORAL_TABLET | Freq: Four times a day (QID) | ORAL | Status: DC | PRN
  Administered 2022-11-22 – 2022-11-25 (×5): 50 mg via ORAL
  Filled 2022-11-21 (×5): qty 1

## 2022-11-21 MED ORDER — PIPERACILLIN-TAZOBACTAM 3.375 G IVPB 30 MIN
3.3750 g | Freq: Once | INTRAVENOUS | Status: AC
  Administered 2022-11-21: 3.375 g via INTRAVENOUS
  Filled 2022-11-21: qty 50

## 2022-11-21 MED ORDER — SUGAMMADEX SODIUM 200 MG/2ML IV SOLN
INTRAVENOUS | Status: DC | PRN
  Administered 2022-11-21: 200 mg via INTRAVENOUS

## 2022-11-21 MED ORDER — OXYCODONE HCL 5 MG/5ML PO SOLN: 5.0000 mg | Freq: Once | ORAL | Status: DC | PRN

## 2022-11-21 MED ORDER — 0.9 % SODIUM CHLORIDE (POUR BTL) OPTIME
TOPICAL | Status: DC | PRN
  Administered 2022-11-21: 1000 mL

## 2022-11-21 MED ORDER — PANTOPRAZOLE SODIUM 40 MG IV SOLR
40.0000 mg | INTRAVENOUS | Status: DC
  Administered 2022-11-21 – 2022-11-23 (×3): 40 mg via INTRAVENOUS
  Filled 2022-11-21 (×3): qty 10

## 2022-11-21 MED ORDER — ACETAMINOPHEN 160 MG/5ML PO SOLN: 1000.0000 mg | Freq: Once | ORAL | Status: DC | PRN

## 2022-11-21 MED ORDER — ORAL CARE MOUTH RINSE: 15.0000 mL | Freq: Once | OROMUCOSAL | Status: AC

## 2022-11-21 MED ORDER — FENTANYL CITRATE (PF) 250 MCG/5ML IJ SOLN
INTRAMUSCULAR | Status: DC | PRN
  Administered 2022-11-21: 75 ug via INTRAVENOUS

## 2022-11-21 MED ORDER — LIDOCAINE 2% (20 MG/ML) 5 ML SYRINGE
INTRAMUSCULAR | Status: AC
  Filled 2022-11-21: qty 5

## 2022-11-21 MED ORDER — METRONIDAZOLE 500 MG/100ML IV SOLN
500.0000 mg | Freq: Two times a day (BID) | INTRAVENOUS | Status: DC
  Administered 2022-11-21 – 2022-11-22 (×2): 500 mg via INTRAVENOUS
  Filled 2022-11-21 (×2): qty 100

## 2022-11-21 MED ORDER — SODIUM CHLORIDE 0.9% FLUSH
3.0000 mL | Freq: Two times a day (BID) | INTRAVENOUS | Status: DC
  Administered 2022-11-22 – 2022-11-25 (×4): 3 mL via INTRAVENOUS

## 2022-11-21 MED ORDER — ACETAMINOPHEN 10 MG/ML IV SOLN
INTRAVENOUS | Status: AC
  Filled 2022-11-21: qty 100

## 2022-11-21 MED ORDER — HYDROCODONE-ACETAMINOPHEN 5-325 MG PO TABS: 1.0000 | ORAL_TABLET | ORAL | Status: DC | PRN

## 2022-11-21 MED ORDER — SUCCINYLCHOLINE CHLORIDE 200 MG/10ML IV SOSY
PREFILLED_SYRINGE | INTRAVENOUS | Status: DC | PRN
  Administered 2022-11-21: 100 mg via INTRAVENOUS

## 2022-11-21 MED ORDER — DEXAMETHASONE SODIUM PHOSPHATE 10 MG/ML IJ SOLN
INTRAMUSCULAR | Status: AC
  Filled 2022-11-21: qty 1

## 2022-11-21 MED ORDER — OXYCODONE HCL 5 MG PO TABS: 5.0000 mg | ORAL_TABLET | Freq: Once | ORAL | Status: DC | PRN

## 2022-11-21 MED ORDER — ACETAMINOPHEN 325 MG PO TABS: 650.0000 mg | ORAL_TABLET | Freq: Four times a day (QID) | ORAL | Status: DC | PRN

## 2022-11-21 MED ORDER — POTASSIUM CHLORIDE 10 MEQ/100ML IV SOLN
10.0000 meq | INTRAVENOUS | Status: DC
  Administered 2022-11-21: 10 meq via INTRAVENOUS
  Filled 2022-11-21: qty 100

## 2022-11-21 MED ORDER — LIDOCAINE 2% (20 MG/ML) 5 ML SYRINGE
INTRAMUSCULAR | Status: DC | PRN
  Administered 2022-11-21: 40 mg via INTRAVENOUS

## 2022-11-21 MED ORDER — ACETAMINOPHEN 10 MG/ML IV SOLN
INTRAVENOUS | Status: DC | PRN
  Administered 2022-11-21: 1000 mg via INTRAVENOUS

## 2022-11-21 MED ORDER — BUPIVACAINE HCL 0.25 % IJ SOLN
INTRAMUSCULAR | Status: DC | PRN
  Administered 2022-11-21: 6 mL

## 2022-11-21 MED ORDER — FENTANYL CITRATE (PF) 250 MCG/5ML IJ SOLN
INTRAMUSCULAR | Status: AC
  Filled 2022-11-21: qty 5

## 2022-11-21 MED ORDER — ONDANSETRON HCL 4 MG/2ML IJ SOLN
4.0000 mg | Freq: Four times a day (QID) | INTRAMUSCULAR | Status: DC | PRN
  Administered 2022-11-21: 4 mg via INTRAVENOUS
  Filled 2022-11-21: qty 2

## 2022-11-21 SURGICAL SUPPLY — 46 items
ADH SKN CLS APL DERMABOND .7 (GAUZE/BANDAGES/DRESSINGS) ×1
APL PRP STRL LF DISP 70% ISPRP (MISCELLANEOUS) ×1
APPLIER CLIP 5 13 M/L LIGAMAX5 (MISCELLANEOUS)
APR CLP MED LRG 5 ANG JAW (MISCELLANEOUS)
BAG COUNTER SPONGE SURGICOUNT (BAG) ×1 IMPLANT
BAG SPNG CNTER NS LX DISP (BAG) ×1
BLADE CLIPPER SURG (BLADE) IMPLANT
CANISTER SUCT 3000ML PPV (MISCELLANEOUS) ×1 IMPLANT
CHLORAPREP W/TINT 26 (MISCELLANEOUS) ×1 IMPLANT
CLIP APPLIE 5 13 M/L LIGAMAX5 (MISCELLANEOUS) IMPLANT
CLIP LIGATING HEMO LOK XL GOLD (MISCELLANEOUS) ×1 IMPLANT
COVER SURGICAL LIGHT HANDLE (MISCELLANEOUS) ×1 IMPLANT
COVER TRANSDUCER ULTRASND (DRAPES) ×1 IMPLANT
DERMABOND ADVANCED .7 DNX12 (GAUZE/BANDAGES/DRESSINGS) ×1 IMPLANT
ELECT REM PT RETURN 9FT ADLT (ELECTROSURGICAL) ×1
ELECTRODE REM PT RTRN 9FT ADLT (ELECTROSURGICAL) ×1 IMPLANT
ENDOLOOP SUT PDS II 0 18 (SUTURE) IMPLANT
GLOVE BIO SURGEON STRL SZ7.5 (GLOVE) ×2 IMPLANT
GOWN STRL REUS W/ TWL LRG LVL3 (GOWN DISPOSABLE) ×2 IMPLANT
GOWN STRL REUS W/ TWL XL LVL3 (GOWN DISPOSABLE) ×1 IMPLANT
GOWN STRL REUS W/TWL LRG LVL3 (GOWN DISPOSABLE) ×2
GOWN STRL REUS W/TWL XL LVL3 (GOWN DISPOSABLE) ×1
GRASPER SUT TROCAR 14GX15 (MISCELLANEOUS) ×1 IMPLANT
IRRIG SUCT STRYKERFLOW 2 WTIP (MISCELLANEOUS) ×1
IRRIGATION SUCT STRKRFLW 2 WTP (MISCELLANEOUS) ×1 IMPLANT
KIT BASIN OR (CUSTOM PROCEDURE TRAY) ×1 IMPLANT
KIT TURNOVER KIT B (KITS) ×1 IMPLANT
NDL INSUFFLATION 14GA 120MM (NEEDLE) ×1 IMPLANT
NEEDLE INSUFFLATION 14GA 120MM (NEEDLE) ×1 IMPLANT
NS IRRIG 1000ML POUR BTL (IV SOLUTION) ×1 IMPLANT
PAD ARMBOARD 7.5X6 YLW CONV (MISCELLANEOUS) ×2 IMPLANT
SCISSORS LAP 5X35 DISP (ENDOMECHANICALS) ×1 IMPLANT
SET TUBE SMOKE EVAC HIGH FLOW (TUBING) ×1 IMPLANT
SLEEVE Z-THREAD 5X100MM (TROCAR) ×1 IMPLANT
SPECIMEN JAR SMALL (MISCELLANEOUS) ×1 IMPLANT
SUT MNCRL AB 4-0 PS2 18 (SUTURE) ×1 IMPLANT
TOWEL GREEN STERILE (TOWEL DISPOSABLE) ×1 IMPLANT
TOWEL GREEN STERILE FF (TOWEL DISPOSABLE) ×1 IMPLANT
TRAY CATH INTERMITTENT SS 16FR (CATHETERS) IMPLANT
TRAY FOLEY W/BAG SLVR 16FR (SET/KITS/TRAYS/PACK)
TRAY FOLEY W/BAG SLVR 16FR ST (SET/KITS/TRAYS/PACK) IMPLANT
TRAY LAPAROSCOPIC MC (CUSTOM PROCEDURE TRAY) ×1 IMPLANT
TROCAR 11X100 Z THREAD (TROCAR) ×1 IMPLANT
TROCAR Z-THREAD OPTICAL 5X100M (TROCAR) ×1 IMPLANT
WARMER LAPAROSCOPE (MISCELLANEOUS) ×1 IMPLANT
WATER STERILE IRR 1000ML POUR (IV SOLUTION) ×1 IMPLANT

## 2022-11-21 NOTE — Anesthesia Procedure Notes (Signed)
Procedure Name: Intubation Date/Time: 11/21/2022 10:56 AM  Performed by: Garfield Cornea, CRNAPre-anesthesia Checklist: Patient identified, Emergency Drugs available, Suction available and Patient being monitored Patient Re-evaluated:Patient Re-evaluated prior to induction Oxygen Delivery Method: Circle System Utilized Preoxygenation: Pre-oxygenation with 100% oxygen Induction Type: IV induction and Rapid sequence Laryngoscope Size: Mac and 3 Grade View: Grade I Tube type: Oral Tube size: 7.0 mm Number of attempts: 1 Airway Equipment and Method: Stylet Placement Confirmation: ETT inserted through vocal cords under direct vision, positive ETCO2 and breath sounds checked- equal and bilateral Secured at: 21 cm Tube secured with: Tape Dental Injury: Teeth and Oropharynx as per pre-operative assessment

## 2022-11-21 NOTE — Op Note (Addendum)
11/21/2022  11:33 AM  PATIENT:  Jessica Fowler  77 y.o. female  PRE-OPERATIVE DIAGNOSIS:  ACUTE APPENDICITIS  POST-OPERATIVE DIAGNOSIS:  ACUTE APPENDICITIS, perforated  PROCEDURE:  Procedure(s): APPENDECTOMY LAPAROSCOPIC (N/A)  SURGEON:  Surgeon(s) and Role:    Axel Filler, MD - Primary  ANESTHESIA:   local and general  EBL:  minimal   BLOOD ADMINISTERED:none  DRAINS: none   LOCAL MEDICATIONS USED:  BUPIVICAINE   SPECIMEN:  Source of Specimen: Appendix  DISPOSITION OF SPECIMEN:  PATHOLOGY  COUNTS:  YES  TOURNIQUET:  * No tourniquets in log *  DICTATION: .Dragon Dictation Indication procedure: Patient is a 77 year old female, with a history of abdominal pain.  Patient was seen in the ER and underwent CT scan.  Patient was found to have likely perforated appendicitis.  Patient was taken back to the operating room for urgent laparoscopic appendectomy.  Findings. Patient with a perforation at the tip of the appendix.  The appendix was shortened.  There was no fecalith that could be seen.  The cecum was fused to the retroperitoneum on the right lateral abdominal wall.  There was significant purulent ascites.  This was aspirated and washed out throughout the abdominal cavity.  Complications: none  Counts: reported as correct x 2  Findings:  The patient had a acutely inflamed, and perforated appendicitis, at the tip.  There was free purulent ascites.   Specimen: Appendix  Indications for procedure:  The patient is a 77 year old female with a history of periumbilical pain localized in the right lower quadrant patient had a CT scan which revealed signs consistent with acute appendicitis the patient back in for laparoscopic appendectomy.  Details of the procedure: The patient was taken back to the operating room. The patient was placed in supine position with bilateral SCDs in place. The patient was prepped and draped in the usual sterile fashion.  After appropriate  anitbiotics were confirmed, a time-out was confirmed and all facts were verified.    A pneumoperitoneum of 14 mmHg was obtained via a Veress needle technique in the left lower quadrant quadrant.  A 10 mm trocar and 5 mm camera then placed intra-abdominally there is no injury to any intra-abdominal organs a 5 mm infraumbilical port was placed and direct visualization as was a 5 mm port in the suprapubic area.   The appendix was identified and seen to be perforated at the tip.  The appendix was cleaned down to the appendiceal base.  The appendix overall was fairly short.  The mesoappendix was then incised and the appendiceal artery was cauterized.  The the appendiceal base was clean.  A gold hemoclip was placed proximallyx2 and one distally and the appendix was transected between these 2. A retrieval bag was then placed into the abdomen and the specimen placed in the bag. The appendiceal stump was cauterized.   There was large amount of purulent ascites throughout the abdominal cavity.  This was irrigated out as much as possible.  The appendix and retrieval  bag was then retrieved via the supraumbilical port. #1 Vicryl was used to reapproximate the fascia at the right lower quadrant port site x1. The skin was reapproximated all port sites 3-0 Monocryl subcuticular fashion. The skin was dressed with Dermabond.  The patient was awakened from general anesthesia was taken to recovery room in stable condition.    CASE DATA:  Type of patient?: DOW CASE ([facility])  Status of Case? URGENT Add On  Infection Present At Time  Of Surgery (PATOS)?   Purulent ascites      PLAN OF CARE: Admit to inpatient   PATIENT DISPOSITION:  PACU - hemodynamically stable.   Delay start of Pharmacological VTE agent (>24hrs) due to surgical blood loss or risk of bleeding: not applicable

## 2022-11-21 NOTE — H&P (Signed)
Agree with Ms. Foust  NPand I collaborated with her on her H&P as well as formulated the plan--- please see her separate documentation  Briefly 78 year old white [place] still quite active Some mild cognitive decline as per daughter who is at the bedside Prior A-fib ablation 2011 Dr. Johney Frame with PACs PVCs not currently on anticoagulation status post ILR 2019--Last seen by Dr. Nelly Laurence EP 10/26/2022--- noted to not be wearing the loop recorder at all--- ILR placed [employer] reveal Linq 2 at that visit Known underlying significant anxiety about her A-fib Because of her mildly tacky episodes, documentation as per Dr. Nelly Laurence 6/18 recommended to stop metoprolol tartrate,  increase succinate to 50 at night and was scheduled to follow-up 12/03/2022 with cardiology to discuss this further   She comes in with appendicitis as per HPI n antibiotics with Zosyn and has been kept n.p.o. On the Cardizem gtt. her map is about 72 and her heart rate is well-controlled in the 80s-I have asked that we cut back the rate to maybe 12 or 10, we will continue her fluids at 125 cc/H, for pain we can use morphine.  Because she is quite active, has never had CAD ,I would estimate her basal METS >3 and from my perspective she is cleared for surgery with moderate risk -I do not think she has any JVD on my exam she seems to be in rate controlled A-fib and her EKG from earlier this morning showed rate related changes which are now resolved on telemetry  Cancel duplicate of lactic acid-we know that she has an infection-resume either heparin or apixaban as per general surgery for timing by them Will need a monitored bed.  Likely progressive   >45 care coordination time  Pleas Koch, MD [facility] 8:42 AM

## 2022-11-21 NOTE — Anesthesia Preprocedure Evaluation (Signed)
Anesthesia Evaluation  Patient identified by MRN, date of birth, ID band  Reviewed: Allergy & Precautions, NPO status , Patient's Chart, lab work & pertinent test results  History of Anesthesia Complications Negative for: history of anesthetic complications  Airway Mallampati: II  TM Distance: >3 FB Neck ROM: Full    Dental  (+) Dental Advisory Given   Pulmonary shortness of breath   breath sounds clear to auscultation       Cardiovascular + CAD  + dysrhythmias Atrial Fibrillation  Rhythm:Irregular  1. Left ventricular ejection fraction, by estimation, is 55 to 60%. The  left ventricle has normal function. The left ventricle has no regional  wall motion abnormalities. Left ventricular diastolic parameters were  normal. Normal global longitudinal  strain of -21.5%.   2. Right ventricular systolic function is normal. The right ventricular  size is normal. There is normal pulmonary artery systolic pressure. The  estimated right ventricular systolic pressure is 26.0 mmHg.   3. There is a trivial pericardial effusion posterior to the left  ventricle.   4. The mitral valve is grossly normal. Mild mitral valve regurgitation.   5. Tricuspid valve regurgitation is mild to moderate.   6. The aortic valve is tricuspid. Aortic valve regurgitation is not  visualized.   7. The inferior vena cava is normal in size with greater than 50%  respiratory variability, suggesting right atrial pressure of 3 mmHg.     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   Neuro/Psych   Anxiety     negative neurological ROS     GI/Hepatic Neg liver ROS,,,  Endo/Other    Renal/GU negative Renal ROS     Musculoskeletal negative musculoskeletal ROS (+)    Abdominal   Peds  Hematology negative hematology ROS (+) Lab Results      Component                Value               Date                      WBC                      17.7 (H)             11/20/2022                HGB                      12.6                11/20/2022                HCT                      37.0                11/20/2022                MCV                      91.1                11/20/2022                PLT  236                 11/20/2022               Anesthesia Other Findings   Reproductive/Obstetrics                             Anesthesia Physical Anesthesia Plan  ASA: 3  Anesthesia Plan: General   Post-op Pain Management: Ofirmev IV (intra-op)*   Induction: Intravenous  PONV Risk Score and Plan: 3  Airway Management Planned: Oral ETT  Additional Equipment: None  Intra-op Plan:   Post-operative Plan: Extubation in OR  Informed Consent: I have reviewed the patients History and Physical, chart, labs and discussed the procedure including the risks, benefits and alternatives for the proposed anesthesia with the patient or authorized representative who has indicated his/her understanding and acceptance.     Dental advisory given and Consent reviewed with POA  Plan Discussed with: CRNA  Anesthesia Plan Comments:         Anesthesia Quick Evaluation

## 2022-11-21 NOTE — Consult Note (Signed)
Reason for Consult:appendicitis Referring Provider: Loleta Dicker  Jessica Fowler is an 77 y.o. female.  HPI: 77 yo female with 1 day of abdominal pain. She vomited one time earlier today. She went to the [facility] ED, but they didn't have a working Ct scanner so she was transferred to [facility]. Pain is in the right lower quadrant. It is severe. It is worse with movement.  She is supposed to be on blood thinners for a fib but has not taken any in the last few weeks.  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 She has never used smokeless tobacco. She reports that she does not drink alcohol and does not use drugs.  Allergies:  Allergies  Allergen Reactions   Pollen Extract Itching   Brimonidine Tartrate Rash    Other reaction(s): redness    Medications: I have reviewed the patient's current medications.  Results for orders placed or performed during the hospital encounter of 11/20/22 (from the past 48 hour(s))  Lactic acid, plasma     Status: Abnormal   Collection Time: 11/20/22 10:38 PM  Result Value Ref Range    Lactic Acid, Venous 2.0 (HH) 0.5 - 1.9 mmol/L    Comment: CRITICAL RESULT CALLED TO, READ BACK BY AND VERIFIED WITH Yolonda Kida, RN, 2337, 11/20/22, EADEDOKUN Performed at [facility], [address], [place], [place] [address]   Comprehensive metabolic panel     Status: Abnormal   Collection Time: 11/20/22 10:57 PM  Result Value Ref Range   Sodium 136 135 - 145 mmol/L   Potassium 3.1 (L) 3.5 - 5.1 mmol/L   Chloride 108 98 - 111 mmol/L   CO2 18 (L) 22 - 32 mmol/L   Glucose, Bld 134 (H) 70 - 99 mg/dL    Comment: Glucose reference range applies only to samples taken after fasting for at least 8 hours.   BUN 8 8 - 23 mg/dL   Creatinine, Ser 6.96 0.44 - 1.00 mg/dL   Calcium 8.4 (L) 8.9 - 10.3 mg/dL   Total Protein 5.9 (L) 6.5 - 8.1 g/dL   Albumin 3.0 (L) 3.5 - 5.0 g/dL   AST 20 15 - 41 U/L   ALT 13 0 - 44 U/L   Alkaline Phosphatase 87 38 - 126 U/L   Total Bilirubin 1.8 (H) 0.3 - 1.2 mg/dL   GFR, Estimated >29 >52 mL/min    Comment: (NOTE) Calculated using the CKD-EPI Creatinine Equation (2021)  Anion gap 10 5 - 15    Comment: Performed at [facility], [address] [place], [place] [address]  CBC with Differential     Status: Abnormal   Collection Time: 11/20/22 10:57 PM  Result Value Ref Range   WBC 17.7 (H) 4.0 - 10.5 K/uL   RBC 4.18 3.87 - 5.11 MIL/uL   Hemoglobin 12.3 12.0 - 15.0 g/dL   HCT 60.4 54.0 - 98.1 %   MCV 91.1 80.0 - 100.0 fL   MCH 29.4 26.0 - 34.0 pg   MCHC 32.3 30.0 - 36.0 g/dL   RDW 19.1 47.8 - 29.5 %   Platelets 236 150 - 400 K/uL   nRBC 0.0 0.0 - 0.2 %   Neutrophils Relative % 90 %   Neutro Abs 16.1 (H) 1.7 - 7.7 K/uL   Lymphocytes Relative 4 %   Lymphs Abs 0.8 0.7 - 4.0 K/uL   Monocytes Relative 4 %   Monocytes Absolute 0.6 0.1 - 1.0 K/uL   Eosinophils Relative 1 %   Eosinophils Absolute 0.1 0.0 - 0.5 K/uL   Basophils Relative 0 %   Basophils Absolute 0.0 0.0 - 0.1 K/uL   Immature Granulocytes 1 %   Abs Immature Granulocytes 0.08  (H) 0.00 - 0.07 K/uL    Comment: Performed at [facility], [address] [place], [place] [address]  Protime-INR     Status: Abnormal   Collection Time: 11/20/22 10:57 PM  Result Value Ref Range   Prothrombin Time 17.1 (H) 11.4 - 15.2 seconds   INR 1.4 (H) 0.8 - 1.2    Comment: (NOTE) INR goal varies based on device and disease states. Performed at [facility], [address], [place], [place] [address]   APTT     Status: None   Collection Time: 11/20/22 10:57 PM  Result Value Ref Range   aPTT 29 24 - 36 seconds    Comment: Performed at [facility], [address], [place], [place] [address]  I-stat chem 8, ED (not at [facility], [facility] or [facility])     Status: Abnormal   Collection Time: 11/20/22 11:13 PM  Result Value Ref Range   Sodium 137 135 - 145 mmol/L   Potassium 3.2 (L) 3.5 - 5.1 mmol/L   Chloride 107 98 - 111 mmol/L   BUN 7 (L) 8 - 23 mg/dL   Creatinine, Ser 9.52 0.44 - 1.00 mg/dL   Glucose, Bld 841 (H) 70 - 99 mg/dL    Comment: Glucose reference range applies only to samples taken after fasting for at least 8 hours.   Calcium, Ion 1.13 (L) 1.15 - 1.40 mmol/L   TCO2 18 (L) 22 - 32 mmol/L   Hemoglobin 12.6 12.0 - 15.0 g/dL   HCT 32.4 40.1 - 02.7 %  Lactic acid, plasma     Status: Abnormal   Collection Time: 11/21/22  1:45 AM  Result Value Ref Range   Lactic Acid, Venous 3.2 (HH) 0.5 - 1.9 mmol/L    Comment: CRITICAL VALUE NOTED. VALUE IS CONSISTENT WITH PREVIOUSLY REPORTED/CALLED VALUE Performed at [facility], [address] [place], [place] [address]   Magnesium     Status: None   Collection Time: 11/21/22  1:45 AM  Result Value Ref Range   Magnesium 1.7 1.7 - 2.4 mg/dL    Comment: Performed at [facility], [address], [place], [place] [address]    CT ABDOMEN PELVIS W  CONTRAST  Result Date: 11/21/2022 CLINICAL DATA:  RLQ abdominal pain EXAM: CT ABDOMEN AND PELVIS WITH CONTRAST TECHNIQUE: Multidetector CT imaging of the abdomen and  pelvis was performed using the standard protocol following bolus administration of intravenous contrast. RADIATION DOSE REDUCTION: This exam was performed according to the departmental dose-optimization program which includes automated exposure control, adjustment of the mA and/or kV according to patient size and/or use of iterative reconstruction technique. CONTRAST:  75mL OMNIPAQUE IOHEXOL 350 MG/ML SOLN COMPARISON:  None Available. FINDINGS: Lower chest: Left base atelectasis. Hepatobiliary: No focal liver abnormality. Status post cholecystectomy. No biliary dilatation. Pneumobilia. Pancreas: Diffusely atrophic. No focal lesion. Otherwise normal pancreatic contour. No surrounding inflammatory changes. No main pancreatic ductal dilatation. Spleen: Normal in size without focal abnormality. Adrenals/Urinary Tract: No adrenal nodule bilaterally. Bilateral kidneys enhance symmetrically. No hydronephrosis. No hydroureter. Subcentimeter hypodensities too small To characterize. The urinary bladder is unremarkable. On delayed imaging, there is no urothelial wall thickening and there are no filling defects in the opacified portions of the bilateral collecting systems or ureters. Stomach/Bowel: Possible small bowel malrotation with no volvulus. Stomach is within normal limits. No evidence of bowel wall thickening or dilatation. Reactive changes of the small bowel within the right lower quadrant. Right lower quadrant inflammatory changes with fat stranding and trace free fluid. Question inflamed appendix (6:26) best seen on coronal with possible appendiceal wall discontinuity. Question appendicolith. Vascular/Lymphatic: Bilateral phleboliths along the ovaries. Stomach is within normal limits. No evidence of bowel wall thickening or dilatation. Appendix appears normal. Reproductive: Uterus and bilateral adnexa are unremarkable. Other: No intraperitoneal free fluid. No intraperitoneal free gas. No organized fluid collection.  Musculoskeletal: No abdominal wall hernia or abnormality. No suspicious lytic or blastic osseous lesions. No acute displaced fracture. Multilevel degenerative changes of the spine. IMPRESSION: 1. Acute appendicitis-likely ruptured. Question appendicoliths. No abscess formation. 2. Colonic diverticulosis with no acute diverticulitis. Electronically Signed   By: Tish Frederickson M.D.   On: 11/21/2022 00:43    Review of Systems  Constitutional:  Positive for malaise/fatigue.  HENT: Negative.    Eyes: Negative.   Respiratory: Negative.    Cardiovascular: Negative.   Gastrointestinal:  Positive for abdominal pain, nausea and vomiting.  Genitourinary: Negative.   Musculoskeletal: Negative.   Skin: Negative.   Neurological: Negative.   Endo/Heme/Allergies: Negative.   Psychiatric/Behavioral: Negative.      PE Blood pressure (!) 98/53, pulse 83, temperature 98.1 F (36.7 C), temperature source Oral, resp. rate 19, SpO2 97 %. Constitutional: NAD; conversant; no deformities Eyes: Moist conjunctiva; no lid lag; anicteric; PERRL Neck: Trachea midline; no thyromegaly Lungs: Normal respiratory effort; no tactile fremitus CV: RRR; no palpable thrills; no pitting edema GI: Abd tender to palpation RLQ with guarding; no palpable hepatosplenomegaly MSK: Normal gait; no clubbing/cyanosis Psychiatric: Appropriate affect; alert and oriented x3 Lymphatic: No palpable cervical or axillary lymphadenopathy Skin: No major subcutaneous nodules. Warm and dry   Assessment/Plan: 77 yo female with acute appendicitis with concern for rupture and in a fib with RVR ealier. She is on a dilt drip. -IV abx -pain control -likely OR for lap appy in the am -we discussed the details of the procedure; that it would be done under general anesthesia, that we would attempt to do the procedure laparoscopically. That the appendix would be isolated from the large and small intestine and then ligated and removed. We discussed the  reason for this is to avoid rupture and infection and resolve the pains. We discussed risks of infection, abscess,  injury to intestines or urinary structures, and need for open incision. She showed good understanding and wanted to proceed.   I reviewed last 24 h vitals and pain scores, last 48 h intake and output, last 24 h labs and trends, and last 24 h imaging results.  This care required high  level of medical decision making.   De Blanch Deni Berti 11/21/2022, 4:44 AM

## 2022-11-21 NOTE — Progress Notes (Signed)
Pharmacy Antibiotic Note  Jessica Fowler is a 77 y.o. female admitted on 11/20/2022 with  acute appendicitis with c/f rupture .  Pharmacy has been consulted for zosyn dosing.  Using historical weight 64kg from 10/26/2022 from chart review for crcl calculation (~43ml/min)  Plan: Zosyn 3.375 g IV q8h by extended infusion F/u renal function, LOT, cultures      Temp (24hrs), Avg:98.2 F (36.8 C), Min:98.1 F (36.7 C), Max:98.2 F (36.8 C)  Recent Labs  Lab 11/20/22 2238 11/20/22 2257 11/20/22 2313 11/21/22 0145 11/21/22 0510  WBC  --  17.7*  --   --   --   CREATININE  --  0.83 0.80  --   --   LATICACIDVEN 2.0*  --   --  3.2* 1.7    CrCl cannot be calculated (Unknown ideal weight.).    Allergies  Allergen Reactions   Pollen Extract Itching   Brimonidine Tartrate Rash    Other reaction(s): redness    Antimicrobials this admission: Zosyn 6/23>  Dose adjustments this admission:   Microbiology results: 6/23 BCX:    Thank you for allowing pharmacy to be a part of this patient's care.  Calton Dach, PharmD Clinical Pharmacist 11/21/2022 8:16 AM

## 2022-11-21 NOTE — H&P (Signed)
History and Physical    Patient: Jessica Fowler VOH:[id number] DOB: 1945/12/27 DOA: 11/20/2022 DOS: the patient was seen and examined on 11/21/2022 PCP: [name], MD  Patient coming from: Home  Chief Complaint:  Chief Complaint  Patient presents with   Abdominal Pain R/O Appendicitis   HPI: Jessica Fowler is a 77 y.o. female with medical history significant of Atrial Fibrillation not compliant with outpatient anticoagulation secondary to cost and mild cognitive decline.  Jessica Fowler presented to [facility] ED after having nausea, one episode of emesis, and lower abdominal pain that began yesterday. In the outside ED their workup showed a leukocytosis of 20.5,  Tmax of 39.7C, and Lactic Acid was 1.7. She was given Zosyn, Tylenol, Morphine, and Zofran. CT imaging not available at that facility and she was transferred to [facility] for CT to rule out appendicitis.   CT imaging in [facility] did confirm appendicitis, general surgery consulted and plan to take her to OR for Laparoscopic Appendectomy. She also went into Atrial Fibrillation with Rapid Ventricular Response, EDP consulted Cardiology and Diltiazem infusion was started. She is currently rate controlled with HR 80s-90s. Hospitalist contacted for admission due to AFIB with RVR.  Review of Systems: As mentioned in the history of present illness. All other systems reviewed and are negative. 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 She has never used smokeless tobacco. She reports that she does not drink alcohol and does not use drugs.  Allergies  Allergen Reactions   Pollen Extract Itching   Brimonidine Tartrate Rash    Other reaction(s): redness    Family History  Problem Relation Age of Onset   Heart failure Mother    Colon cancer Father    Hyperthyroidism Son    Hypothyroidism Daughter    Coronary artery disease Neg Hx     Prior to Admission medications   Medication Sig Start Date End Date Taking? Authorizing Provider  calcium-vitamin D (OSCAL WITH D) 500-200 MG-UNIT tablet Take 1 tablet by mouth 3 (three) times a week.    Yes [provider]  Cyanocobalamin (VITAMIN B-12 PO) Take 1 tablet by mouth daily.   Yes [provider]  diltiazem (CARDIZEM) 30 MG tablet Take 1 tablet every 4 hours AS NEEDED for heart rate >100 09/06/22  Yes Sharlene Dory, NP  dorzolamide-timolol (COSOPT) 22.3-6.8 MG/ML ophthalmic solution Place 1 drop into both eyes 2 (two) times daily.   Yes [provider]  latanoprost (XALATAN) 0.005 % ophthalmic solution Place 1 drop into both eyes at bedtime.    Yes [provider]  metoprolol succinate (TOPROL-XL) 25 MG 24 hr tablet Take 25 mg by mouth every morning.   Yes [provider]  metoprolol tartrate (LOPRESSOR) 25  MG tablet Take 25 mg by mouth at bedtime.   Yes [provider]  apixaban (ELIQUIS) 5 MG TABS tablet Take 1 tablet (5 mg total) by mouth 2 (two) times daily. Patient not taking: Reported on 10/05/2022 09/06/22   Sharlene Dory, NP  nitroGLYCERIN (NITROSTAT) 0.4 MG SL tablet Place 1 tablet (0.4 mg total) under the tongue every 5 (five) minutes as needed for chest pain (MAX 3 TABLETS). Patient not taking: Reported on 11/21/2022 03/27/19   Sheilah Pigeon, PA-C     Physical Exam: Vitals:   11/21/22 0600 11/21/22 0630 11/21/22 0700 11/21/22 0800  BP: (!) 96/49 (!) 99/45 (!) 94/48 (!) 97/48  Pulse: 72 72 71 73  Resp: 12 17 20 13   Temp:      TempSrc:      SpO2: 95% 97% 95% 96%   Constitutional: NAD, calm, comfortable Eyes: PERRL, lids and conjunctivae normal ENMT: Mucous membranes are moist. Posterior pharynx clear of any exudate or lesions.  Neck: normal, supple, no masses, no thyromegaly Respiratory: clear to auscultation bilaterally, no wheezing, no crackles. Normal respiratory effort. No accessory muscle use.  Cardiovascular: Regular rate and irregular rhythm, no murmurs / rubs / gallops. No extremity edema. 2+ radial and pedal pulses. No carotid bruits.  Abdomen: no tenderness, no masses palpated. No hepatosplenomegaly. Bowel sounds positive x4.  Musculoskeletal: no clubbing / cyanosis. No joint deformity upper and lower extremities. Good ROM, no contractures. Normal muscle tone.  Skin: no rashes, lesions, ulcers. No induration Neurologic: CN 2-12 grossly intact. Sensation intact. Strength 5/5 x all 4 extremities.  Psychiatric: Normal judgment and insight. Alert and oriented x 3. Normal mood.   Data Reviewed:  CBC    Component Value Date/Time   WBC 17.7 (H) 11/20/2022 2257   RBC 4.18 11/20/2022 2257   HGB 12.6 11/20/2022 2313   HCT 37.0 11/20/2022 2313   PLT 236 11/20/2022 2257   MCV 91.1 11/20/2022 2257   MCH 29.4 11/20/2022 2257   MCHC 32.3 11/20/2022 2257   RDW 12.6 11/20/2022 2257   LYMPHSABS 0.8 11/20/2022 2257   MONOABS 0.6 11/20/2022 2257   EOSABS 0.1 11/20/2022 2257   BASOSABS 0.0 11/20/2022 2257   CMP     Component Value Date/Time   NA 137 11/20/2022 2313   NA 142 06/02/2017 0955   K 3.2 (L) 11/20/2022 2313   CL 107 11/20/2022 2313   CO2 18 (L) 11/20/2022 2257   GLUCOSE 132 (H) 11/20/2022 2313   BUN 7 (L) 11/20/2022 2313   BUN 10 06/02/2017 0955   CREATININE 0.80 11/20/2022 2313   CREATININE 0.86 01/02/2016  1153   CALCIUM 8.4 (L) 11/20/2022 2257   PROT 5.9 (L) 11/20/2022 2257   ALBUMIN 3.0 (L) 11/20/2022 2257   AST 20 11/20/2022 2257   ALT 13 11/20/2022 2257   ALKPHOS 87 11/20/2022 2257   BILITOT 1.8 (H) 11/20/2022 2257   GFRNONAA >60 11/20/2022 2257   Lactic Acid, Venous    Component Value Date/Time   LATICACIDVEN 1.7 11/21/2022 0510   Results for orders placed or performed during the hospital encounter of 11/20/22  Blood Culture (routine x 2)     Status: None (Preliminary result)   Collection Time: 11/20/22 11:00 PM   Specimen: BLOOD LEFT WRIST  Result Value Ref Range Status   Specimen Description BLOOD LEFT WRIST  Final   Special Requests   Final    BOTTLES DRAWN AEROBIC AND ANAEROBIC Blood Culture results may not be optimal due to an  inadequate volume of blood received in culture bottles   Culture   Final    NO GROWTH < 12 HOURS Performed at [facility], [address], [place], [place] [address]    Report Status PENDING  Incomplete  Blood Culture (routine x 2)     Status: None (Preliminary result)   Collection Time: 11/20/22 11:00 PM   Specimen: BLOOD RIGHT WRIST  Result Value Ref Range Status   Specimen Description BLOOD RIGHT WRIST  Final   Special Requests   Final    BOTTLES DRAWN AEROBIC AND ANAEROBIC Blood Culture results may not be optimal due to an excessive volume of blood received in culture bottles   Culture   Final    NO GROWTH < 12 HOURS Performed at [facility], [address], [place], [place] [address]    Report Status PENDING  Incomplete   CT ABDOMEN PELVIS W CONTRAST  Result Date: 11/21/2022 CLINICAL DATA:  RLQ abdominal pain EXAM: CT ABDOMEN AND PELVIS WITH CONTRAST TECHNIQUE: Multidetector CT imaging of the abdomen and pelvis was performed using the standard protocol following bolus administration of intravenous contrast. RADIATION DOSE REDUCTION: This exam was performed according to the departmental dose-optimization program which  includes automated exposure control, adjustment of the mA and/or kV according to patient size and/or use of iterative reconstruction technique. CONTRAST:  75mL OMNIPAQUE IOHEXOL 350 MG/ML SOLN COMPARISON:  None Available. FINDINGS: Lower chest: Left base atelectasis. Hepatobiliary: No focal liver abnormality. Status post cholecystectomy. No biliary dilatation. Pneumobilia. Pancreas: Diffusely atrophic. No focal lesion. Otherwise normal pancreatic contour. No surrounding inflammatory changes. No main pancreatic ductal dilatation. Spleen: Normal in size without focal abnormality. Adrenals/Urinary Tract: No adrenal nodule bilaterally. Bilateral kidneys enhance symmetrically. No hydronephrosis. No hydroureter. Subcentimeter hypodensities too small To characterize. The urinary bladder is unremarkable. On delayed imaging, there is no urothelial wall thickening and there are no filling defects in the opacified portions of the bilateral collecting systems or ureters. Stomach/Bowel: Possible small bowel malrotation with no volvulus. Stomach is within normal limits. No evidence of bowel wall thickening or dilatation. Reactive changes of the small bowel within the right lower quadrant. Right lower quadrant inflammatory changes with fat stranding and trace free fluid. Question inflamed appendix (6:26) best seen on coronal with possible appendiceal wall discontinuity. Question appendicolith. Vascular/Lymphatic: Bilateral phleboliths along the ovaries. Stomach is within normal limits. No evidence of bowel wall thickening or dilatation. Appendix appears normal. Reproductive: Uterus and bilateral adnexa are unremarkable. Other: No intraperitoneal free fluid. No intraperitoneal free gas. No organized fluid collection. Musculoskeletal: No abdominal wall hernia or abnormality. No suspicious lytic or blastic osseous lesions. No acute displaced fracture. Multilevel degenerative changes of the spine. IMPRESSION: 1. Acute  appendicitis-likely ruptured. Question appendicoliths. No abscess formation. 2. Colonic diverticulosis with no acute diverticulitis. Electronically Signed   By: Tish Frederickson M.D.   On: 11/21/2022 00:43    EKG Interpretation  Date/Time:  Sunday November 21 2022 01:12:24 EDT Ventricular Rate:  148 PR Interval:  * QRS Duration:  80 QT Interval:  249 QTC Calculation: 391 R Axis:   -42 Text Interpretation: Atrial Fibrillation with Rapid Ventricular Rate, Left Anterior Fascicular Block.   EKG shows rate related changes that have resolved on telemetry this AM after initiation of IV Diltiazem.  Assessment and Plan: #Acute Appendicitis Leukocytosis of 20.5 and Tmax of 39.7C at [facility] ED prior to transfer to [facility] ED. CT Abdomen Pelvis in [facility] shows acute appendicitis that has  likely ruptured. - Zosyn - Continue IVF - PRN Analgesia - PRN Antiemetics - Follow culture data - Surgery consulted, appreciate their management and recommendations  #Atrial Fibrillation with Rapid Ventricular Response In setting of acute appendicitis. Has a history of AFIB requiring ablation in the past. On Toprolol and Lopressor oupatient with PRN Cardizem. . Non compliant with home Eliquis secondary to cost - Continue Diltiazem drip - Goal K>4, Mg >2, supplement PRN - Defer cardiology consultation and further workup at this time, patient has a history of AFIB, missed PM dose of Toprol XL, and infectious process is likely contributing.  VTE prophylaxis: SCDs GI prophylaxis: Protonix Diet: NPO Access: PIV Lines: NONE Telemetry: Yes Disposition: Admit to Progressive  Advance Care Planning: Code Status: FULL  Consults: Surgery, Cardiology  Family Communication: Daughter, Jessica Fowler at bedside  Severity of Illness: The appropriate patient status for this patient is INPATIENT. Inpatient status is judged to be reasonable and necessary in order to provide the required intensity of service to ensure the  patient's safety. The patient's presenting symptoms, physical exam findings, and initial radiographic and laboratory data in the context of their chronic comorbidities is felt to place them at high risk for further clinical deterioration. Furthermore, it is not anticipated that the patient will be medically stable for discharge from the hospital within 2 midnights of admission.   * I certify that at the point of admission it is my clinical judgment that the patient will require inpatient hospital care spanning beyond 2 midnights from the point of admission due to high intensity of service, high risk for further deterioration and high frequency of surveillance required.*  Author: Lajoyce Corners, NP 11/21/2022 8:03 AM  For on call review [email or web address].

## 2022-11-21 NOTE — ED Notes (Signed)
Patient transported to CT scan . 

## 2022-11-21 NOTE — Transfer of Care (Signed)
Immediate Anesthesia Transfer of Care Note  Patient: Jessica Fowler  Procedure(s) Performed: APPENDECTOMY LAPAROSCOPIC (Abdomen)  Patient Location: PACU  Anesthesia Type:General  Level of Consciousness: awake and alert   Airway & Oxygen Therapy: Patient Spontanous Breathing  Post-op Assessment: Report given to RN and Post -op Vital signs reviewed and stable  Post vital signs: Reviewed and stable  Last Vitals:  Vitals Value Taken Time  BP 110/47 11/21/22 1155  Temp    Pulse 72 11/21/22 1158  Resp 25 11/21/22 1158  SpO2 93 % 11/21/22 1158  Vitals shown include unvalidated device data.  Last Pain:  Vitals:   11/21/22 0838  TempSrc: Oral  PainSc:          Complications: No notable events documented.

## 2022-11-22 ENCOUNTER — Encounter (HOSPITAL_COMMUNITY): Payer: Self-pay | Admitting: General Surgery

## 2022-11-22 DIAGNOSIS — K352 Acute appendicitis with generalized peritonitis, without perforation or abscess: Secondary | ICD-10-CM | POA: Diagnosis not present

## 2022-11-22 LAB — MAGNESIUM: Magnesium: 2.3 mg/dL (ref 1.7–2.4)

## 2022-11-22 LAB — PROCALCITONIN: Procalcitonin: 7.11 ng/mL

## 2022-11-22 LAB — BASIC METABOLIC PANEL
Anion gap: 9 (ref 5–15)
BUN: 8 mg/dL (ref 8–23)
CO2: 18 mmol/L — ABNORMAL LOW (ref 22–32)
Calcium: 8.4 mg/dL — ABNORMAL LOW (ref 8.9–10.3)
Chloride: 108 mmol/L (ref 98–111)
Creatinine, Ser: 0.85 mg/dL (ref 0.44–1.00)
GFR, Estimated: >60 mL/min (ref >60)
Glucose, Bld: 211 mg/dL — ABNORMAL HIGH (ref 70–99)
Potassium: 4.4 mmol/L (ref 3.5–5.1)
Sodium: 135 mmol/L (ref 135–145)

## 2022-11-22 LAB — GLUCOSE, CAPILLARY
Glucose-Capillary: 158 mg/dL — ABNORMAL HIGH (ref 70–99)
Glucose-Capillary: 175 mg/dL — ABNORMAL HIGH (ref 70–99)
Glucose-Capillary: 187 mg/dL — ABNORMAL HIGH (ref 70–99)
Glucose-Capillary: 196 mg/dL — ABNORMAL HIGH (ref 70–99)

## 2022-11-22 LAB — CBC
HCT: 36.1 % (ref 36.0–46.0)
Hemoglobin: 11.3 g/dL — ABNORMAL LOW (ref 12.0–15.0)
MCH: 28.7 pg (ref 26.0–34.0)
MCHC: 31.3 g/dL (ref 30.0–36.0)
MCV: 91.6 fL (ref 80.0–100.0)
Platelets: 222 10*3/uL (ref 150–400)
RBC: 3.94 MIL/uL (ref 3.87–5.11)
RDW: 13 % (ref 11.5–15.5)
WBC: 25 10*3/uL — ABNORMAL HIGH (ref 4.0–10.5)
nRBC: 0 % (ref 0.0–0.2)

## 2022-11-22 LAB — CULTURE, BLOOD (ROUTINE X 2)

## 2022-11-22 LAB — CORTISOL: Cortisol, Plasma: 3.4 ug/dL

## 2022-11-22 LAB — TSH: TSH: 2.826 u[IU]/mL (ref 0.350–4.500)

## 2022-11-22 LAB — C-REACTIVE PROTEIN: CRP: 20.8 mg/dL — ABNORMAL HIGH (ref <1.0)

## 2022-11-22 LAB — BRAIN NATRIURETIC PEPTIDE: B Natriuretic Peptide: 203.2 pg/mL — ABNORMAL HIGH (ref 0.0–100.0)

## 2022-11-22 MED ORDER — ACETAMINOPHEN 500 MG PO TABS
1000.0000 mg | ORAL_TABLET | Freq: Four times a day (QID) | ORAL | Status: DC
  Administered 2022-11-22 – 2022-11-25 (×9): 1000 mg via ORAL
  Filled 2022-11-22 (×10): qty 2

## 2022-11-22 MED ORDER — DILTIAZEM HCL 60 MG PO TABS
60.0000 mg | ORAL_TABLET | Freq: Three times a day (TID) | ORAL | Status: DC
  Administered 2022-11-22 (×2): 60 mg via ORAL
  Filled 2022-11-22 (×3): qty 1

## 2022-11-22 MED ORDER — DORZOLAMIDE HCL-TIMOLOL MAL 2-0.5 % OP SOLN
1.0000 [drp] | Freq: Two times a day (BID) | OPHTHALMIC | Status: DC
  Administered 2022-11-22 – 2022-11-25 (×7): 1 [drp] via OPHTHALMIC
  Filled 2022-11-22: qty 10

## 2022-11-22 MED ORDER — POTASSIUM CHLORIDE 2 MEQ/ML IV SOLN
INTRAVENOUS | Status: DC
  Filled 2022-11-22 (×3): qty 1000

## 2022-11-22 MED ORDER — NITROGLYCERIN 0.4 MG SL SUBL: 0.4000 mg | SUBLINGUAL_TABLET | SUBLINGUAL | Status: DC | PRN

## 2022-11-22 MED ORDER — METHOCARBAMOL 1000 MG/10ML IJ SOLN: 500.0000 mg | Freq: Three times a day (TID) | INTRAVENOUS | Status: DC | PRN

## 2022-11-22 MED ORDER — LATANOPROST 0.005 % OP SOLN
1.0000 [drp] | Freq: Every day | OPHTHALMIC | Status: DC
  Administered 2022-11-22 – 2022-11-24 (×3): 1 [drp] via OPHTHALMIC
  Filled 2022-11-22: qty 2.5

## 2022-11-22 MED ORDER — HYDROCODONE-ACETAMINOPHEN 5-325 MG PO TABS: 1.0000 | ORAL_TABLET | Freq: Four times a day (QID) | ORAL | Status: DC | PRN

## 2022-11-22 NOTE — Progress Notes (Signed)
Occupational Therapy Evaluation Patient Details Name: Jessica Fowler MRN: [id number] DOB: [birth year] Today's Date: 11/22/2022   History of Present Illness 77 yo female with onset of abd pain and N&V was admitted 6/22, had laparoscopic appendectomy with washout for perforated appendix.  Noted purulent ascites.  PMHx:  a-fib, cognitive changes, ablation, PVC, HTN,   Clinical Impression   Patient lives alone and has main level living and it very active and mows her own lawn.  Independent in ADLs and IADLs and functional mobility.  Patient currently requires mod A to max A for LB ADLs 2/2 abdominal pain  from lap appendectomy.  Patient educated on abdominal precautions when completed bed mobility and during ADLs.  Per daughter, her son will be staying with her and helping her as needed when patient returns home. Further OT intervention would be beneficial for patient to be educated on AE for LB ADLs, activity tolerance and UB strength     Recommendations for follow up therapy are one component of a multi-disciplinary discharge planning process, led by the attending physician.  Recommendations may be updated based on patient status, additional functional criteria and insurance authorization.   Assistance Recommended at Discharge Intermittent Supervision/Assistance  Patient can return home with the following A little help with bathing/dressing/bathroom;Assistance with cooking/housework;Assist for transportation;A little help with walking and/or transfers    Functional Status Assessment  Patient has had a recent decline in their functional status and demonstrates the ability to make significant improvements in function in a reasonable and predictable amount of time.  Equipment Recommendations       Recommendations for Other Services       Precautions / Restrictions Precautions Precautions: Fall Precaution Comments: monitor for abd pain Restrictions Weight Bearing Restrictions: No       Mobility Bed Mobility Overal bed mobility: Needs Assistance Bed Mobility: Sidelying to Sit, Rolling, Sit to Sidelying Rolling: Min assist Sidelying to sit: Min assist     Sit to sidelying: Min guard General bed mobility comments: pt is progressively moving better, using bed features but has pain in abd    Transfers Overall transfer level: Needs assistance Equipment used: Rolling walker (2 wheels) Transfers: Sit to/from Stand Sit to Stand: Min guard                  Balance Overall balance assessment: Needs assistance Sitting-balance support: Feet supported Sitting balance-Leahy Scale: Good                                     ADL either performed or assessed with clinical judgement   ADL Overall ADL's : Needs assistance/impaired Eating/Feeding: Independent   Grooming: Min guard;Wash/dry face;Cueing for safety;Standing   Upper Body Bathing: Minimal assistance   Lower Body Bathing: Moderate assistance   Upper Body Dressing : Minimal assistance;Sitting   Lower Body Dressing: Maximal assistance   Toilet Transfer: Minimal assistance   Toileting- Clothing Manipulation and Hygiene: Min guard;Cueing for safety       Functional mobility during ADLs: Min guard;Rolling walker (2 wheels)       Vision Patient Visual Report: No change from baseline       Perception     Praxis      Pertinent Vitals/Pain Pain Assessment Pain Assessment: 0-10 Pain Score: 7  Pain Location: abd in bed Pain Descriptors / Indicators: Aching, Guarding Pain Intervention(s): Limited activity within patient's tolerance     Hand  Dominance Right   Extremity/Trunk Assessment Upper Extremity Assessment Upper Extremity Assessment: Overall WFL for tasks assessed       Cervical / Trunk Assessment Cervical / Trunk Assessment: Other exceptions Cervical / Trunk Exceptions: pain from procedure   Communication Communication Communication: No difficulties    Cognition Arousal/Alertness: Awake/alert Behavior During Therapy: WFL for tasks assessed/performed Overall Cognitive Status: History of cognitive impairments - at baseline                                 General Comments: per daughter has had a mild cognitive decline     General Comments       Exercises     Shoulder Instructions      Home Living Family/patient expects to be discharged to:: Private residence Living Arrangements: Alone Available Help at Discharge: Family;Available PRN/intermittently Type of Home: House Home Access: Stairs to enter [employer] of Steps: 3 Entrance Stairs-Rails: Can reach both Home Layout: One level     Bathroom Shower/Tub: Chief Strategy Officer: Standard     Home Equipment: Agricultural consultant (2 wheels);BSC/3in1;Wheelchair - manual   Additional 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, active person      Prior Functioning/Environment Prior Level of Function : Independent/Modified Independent             Mobility Comments: no AD needed          OT Problem List: Decreased strength;Decreased activity tolerance;Decreased knowledge of use of DME or AE      OT Treatment/Interventions: Self-care/ADL training;Therapeutic exercise;Energy conservation;DME and/or AE instruction;Therapeutic activities;Patient/family education    OT Goals(Current goals can be found in the care plan section) Acute Rehab OT Goals OT Goal Formulation: With patient Time For Goal Achievement: 11/29/22 Potential to Achieve Goals: Good  OT Frequency: Min 2X/week    Co-evaluation              AM-PAC OT "6 Clicks" Daily Activity     Outcome Measure Help from another person eating meals?: None Help from another person taking care of personal grooming?: None Help from another person toileting, which includes using toliet, bedpan, or urinal?: A Little Help from another person bathing (including washing,  rinsing, drying)?: A Lot Help from another person to put on and taking off regular upper body clothing?: A Little Help from another person to put on and taking off regular lower body clothing?: A Lot 6 Click Score: 18   End of Session Nurse Communication: Mobility status  Activity Tolerance: Patient tolerated treatment well Patient left: in bed;with call bell/phone within reach;with bed alarm set;with family/visitor present  OT Visit Diagnosis: Muscle weakness (generalized) (M62.81);Unsteadiness on feet (R26.81)                Time: 1610-9604 OT Time Calculation (min): 22 min Charges:  OT Evaluation $OT Eval Moderate Complexity: 1 Mod  Governor Specking OT/L  Denice Paradise 11/22/2022, 4:46 PM

## 2022-11-22 NOTE — Evaluation (Signed)
Physical Therapy Evaluation Patient Details Name: Jessica Fowler MRN: [id number] DOB: Feb 01, 1946 Today's Date: 11/22/2022  History of Present Illness  77 yo female with onset of abd pain and N&V was admitted 6/22, had laparoscopic appendectomy with washout for perforated appendix.  Noted purulent ascites.  PMHx:  a-fib, cognitive changes, ablation, PVC, HTN,  Clinical Impression  Pt was seen for assessment of mobility and note her abd pain is her main limitation, as pt has been fully independent at home to ride a mower and manage at home.  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  Pt is expecting to go home, daughter in attendance to confirm this and declines HHPT.  Follow acutely to work on gait and stairs, to progress as pt tolerates and to instruct in body mechanics for safety of abd incision for lap procedure.       Recommendations for follow up therapy are one component of a multi-disciplinary discharge planning process, led by the attending physician.  Recommendations may be updated based on patient status, additional functional criteria and insurance authorization.  Follow Up Recommendations       Assistance Recommended at Discharge Intermittent Supervision/Assistance  Patient can return home with the following  A little help with walking and/or transfers;A little help with bathing/dressing/bathroom;Assistance with cooking/housework;Assist for transportation;Help with stairs or ramp for entrance    Equipment Recommendations None recommended by PT  Recommendations for Other Services       Functional Status Assessment Patient has had a recent decline in their functional status and demonstrates the ability to make significant improvements in function in a reasonable and predictable amount of time.     Precautions / Restrictions Precautions Precautions: Fall Precaution Comments: monitor for abd pain Restrictions Weight Bearing  Restrictions: No      Mobility  Bed Mobility Overal bed mobility: Needs Assistance Bed Mobility: Sidelying to Sit, Rolling, Sit to Sidelying Rolling: Supervision Sidelying to sit: Min assist     Sit to sidelying: Min guard General bed mobility comments: pt is progressively moving better, using bed features but has pain in abd    Transfers Overall transfer level: Modified independent Equipment used: None               General transfer comment: stood with extra time, no assist per pt should be needed    Ambulation/Gait Ambulation/Gait assistance: Min guard (for safety) Gait Distance (Feet): 40 Feet Assistive device: 1 person hand held assist (min guard for safety) Gait Pattern/deviations: Step-through pattern, Trunk flexed, Wide base of support Gait velocity: reduced Gait velocity interpretation: <1.31 ft/sec, indicative of household ambulator Pre-gait activities: postural ck and balance ck General Gait Details: pt is guarded in posture and declines a walker but has one at home  Stairs            Wheelchair Mobility    Modified Rankin (Stroke Patients Only)       Balance Overall balance assessment: Needs assistance Sitting-balance support: Feet supported Sitting balance-Leahy Scale: Good     Standing balance support: No upper extremity supported Standing balance-Leahy Scale: Fair Standing balance comment: fair but appears unsteady, pt will not allow a walker                             Pertinent Vitals/Pain Pain Assessment Pain Assessment: Faces Faces Pain Scale: Hurts little more Pain Location: abd in bed  Pain Descriptors / Indicators: Aching, Guarding Pain Intervention(s): Repositioned, Monitored during session, Limited activity within patient's tolerance, Premedicated before session    Home Living Family/patient expects to be discharged to:: Private residence Living Arrangements: Alone Available Help at Discharge:  Family;Available PRN/intermittently Type of Home: House Home Access: Stairs to enter Entrance Stairs-Rails: Can reach both Entrance Stairs-Number of Steps: 3   Home Layout: One level Home Equipment: Agricultural consultant (2 wheels);BSC/3in1;Wheelchair - manual Additional 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, active person    Prior Function Prior Level of Function : Independent/Modified Independent             Mobility Comments: no AD needed       Hand Dominance   Dominant Hand: Right    Extremity/Trunk Assessment   Upper Extremity Assessment Upper Extremity Assessment: Overall WFL for tasks assessed    Lower Extremity Assessment Lower Extremity Assessment: Overall WFL for tasks assessed    Cervical / Trunk Assessment Cervical / Trunk Assessment: Other exceptions (abd lap surgery with washout) Cervical / Trunk Exceptions: pain from procedure  Communication   Communication: No difficulties  Cognition Arousal/Alertness: Awake/alert Behavior During Therapy: WFL for tasks assessed/performed Overall Cognitive Status: History of cognitive impairments - at baseline                                 General Comments: per daughter has had a mild cognitive decline        General Comments General comments (skin integrity, edema, etc.): Pt was seen for mobility with no walker per her refusal, but is demonstrating a flexed posture with mild unstable appearance    Exercises     Assessment/Plan    PT Assessment Patient needs continued PT services  PT Problem List Decreased activity tolerance;Pain;Decreased skin integrity;Decreased mobility;Decreased coordination;Decreased cognition       PT Treatment Interventions DME instruction;Gait training;Stair training;Functional mobility training;Therapeutic activities;Therapeutic exercise;Balance training;Neuromuscular re-education;Patient/family education    PT Goals (Current goals can be found in the  Care Plan section)  Acute Rehab PT Goals Patient Stated Goal: to get pain reduced and get home PT Goal Formulation: With patient/family Time For Goal Achievement: 12/06/22 Potential to Achieve Goals: Good    Frequency Min 3X/week     Co-evaluation               AM-PAC PT "6 Clicks" Mobility  Outcome Measure Help needed turning from your back to your side while in a flat bed without using bedrails?: A Little Help needed moving from lying on your back to sitting on the side of a flat bed without using bedrails?: A Little Help needed moving to and from a bed to a chair (including a wheelchair)?: A Little Help needed standing up from a chair using your arms (e.g., wheelchair or bedside chair)?: A Little Help needed to walk in hospital room?: A Little Help needed climbing 3-5 steps with a railing? : A Little 6 Click Score: 18    End of Session   Activity Tolerance: Patient limited by pain Patient left: in bed;with call bell/phone within reach;with family/visitor present;with bed alarm set Nurse Communication: Mobility status PT Visit Diagnosis: Other abnormalities of gait and mobility (R26.89);Pain;Difficulty in walking, not elsewhere classified (R26.2) Pain - Right/Left:  (abdomen) Pain - part of body:  (abdomen)    Time: 5366-4403 PT Time Calculation (min) (ACUTE ONLY): 18 min   Charges:  PT Evaluation $PT Eval Moderate Complexity: 1 Mod         Ivar Drape 11/22/2022, 1:13 PM  Samul Dada, PT PhD Acute Rehab Dept. Number: [facility] [id number] and [facility] [phone]

## 2022-11-22 NOTE — Progress Notes (Signed)
PROGRESS NOTE                                                                                                                                                                                                             Patient Demographics:    Jessica Fowler, is a 77 y.o. female, DOB - 30-Apr-1946, QMV:[id number]  Outpatient Primary MD for the patient is [name], MD    LOS - 1  Admit date - 11/20/2022    Chief Complaint  Patient presents with   Abdominal Pain R/O Appendicitis       Brief Narrative (HPI from H&P)   77 y.o. female with medical history significant of Atrial Fibrillation not compliant with outpatient anticoagulation secondary to cost and mild cognitive decline.  Scented to [facility] ER after nausea vomiting and abdominal pain was found to have a ruptured appendicitis and was transferred to [facility], seen by general surgery, went into A-fib RVR placed on Cardizem drip subsequently taken to the OR for laparoscopic appendectomy.  Transferred to my service on 11/22/2022.   Subjective:    Jessica Fowler today has, No headache, No chest pain, mild postop abdominal discomfort, passing some flatus but no BMs yet- No Nausea, No new weakness tingling or numbness, no SOB   Assessment  & Plan :   Acute appendicitis.  Seen by general surgery underwent laparoscopic appendectomy by Dr. Derrell Lolling on 11/21/2022, continue empiric antibiotics, has some bowel function but not completely back to baseline yet, continue to monitor closely, gentle IV fluids, advance activity, encouraged to use I-S.  Daughter updated bedside on 11/22/2022.  Early sepsis pathophysiology and on admission is resolving.  Paroxysmal A-fib and RVR.  Placed on Cardizem drip, [place] vas 2 score is greater than 3 but she is not on anticoagulation at home, on IV Cardizem, placed on p.o. Cardizem overlap, titrate off IV Cardizem if in rate control, if blood  pressure stable we will switch her to her home dose oral beta-blocker in the next day or so.  Note patient was prescribed Eliquis by her primary cardiologist but never started taking it.  This was several months ago.  Hypertension.  Blood pressure soft for now oral Cardizem, once blood pressure is improved home beta-blocker will be reintroduced, she takes Cardizem as needed at home.      Condition -  Fair  Family Communication  :  daughter bedside 11/22/22  Code Status :  Full  Consults  :  CCS   PUD Prophylaxis : PPI   Procedures  :     Laparoscopic appendectomy by Dr. Derrell Lolling on 11/21/2022.    CT - 1. Acute appendicitis-likely ruptured. Question appendicoliths. No abscess formation. 2. Colonic diverticulosis with no acute diverticulitis.      Disposition Plan  :    Status is: Inpatient  DVT Prophylaxis  :    enoxaparin (LOVENOX) injection 40 mg Start: 11/22/22 0800 SCD's Start: 11/21/22 1350    Lab Results  Component Value Date   PLT 222 11/22/2022    Diet :  Diet Order             Diet clear liquid Room service appropriate? Yes; Fluid consistency: Thin  Diet effective now                    Inpatient Medications  Scheduled Meds:  diltiazem  60 mg Oral Q8H   enoxaparin (LOVENOX) injection  40 mg Subcutaneous Q24H   metoprolol succinate  25 mg Oral q morning   pantoprazole (PROTONIX) IV  40 mg Intravenous Q24H   sodium chloride flush  3 mL Intravenous Q12H   Continuous Infusions:  diltiazem (CARDIZEM) infusion 5 mg/hr (11/22/22 0000)   lactated ringers 1,000 mL with potassium chloride 30 mEq infusion     piperacillin-tazobactam (ZOSYN)  IV 3.375 g (11/22/22 0600)   PRN Meds:.acetaminophen **OR** acetaminophen, HYDROcodone-acetaminophen, morphine injection, ondansetron **OR** ondansetron (ZOFRAN) IV, ondansetron **OR** ondansetron (ZOFRAN) IV, polyethylene glycol, traMADol    Objective:   Vitals:   11/22/22 0444 11/22/22 0541 11/22/22 0600 11/22/22  0900  BP:   (!) 103/56 (!) 110/59  Pulse: 66 72 64 64  Resp:      Temp:    97.7 F (36.5 C)  TempSrc:    Oral  SpO2: 93% 93% 94%   Weight:      Height:        Wt Readings from Last 3 Encounters:  11/21/22 64 kg  10/26/22 64 kg  10/05/22 61.7 kg     Intake/Output Summary (Last 24 hours) at 11/22/2022 0919 Last data filed at 11/22/2022 0323 Gross per 24 hour  Intake 1484.61 ml  Output 3205 ml  Net -1720.39 ml     Physical Exam  Awake Alert, No new F.N deficits, Normal affect [place].AT,PERRAL Supple Neck, No JVD,   Symmetrical Chest wall movement, Good air movement bilaterally, CTAB RRR,No Gallops,Rubs or new Murmurs,  Hypoactive but +ve B.Sounds, Abd Soft, midline abdominal incision under bandage, mild postop abdominal tenderness No Cyanosis, Clubbing or edema       Data Review:    Recent Labs  Lab 11/20/22 2257 11/20/22 2313 11/22/22 0424  WBC 17.7*  --  25.0*  HGB 12.3 12.6 11.3*  HCT 38.1 37.0 36.1  PLT 236  --  222  MCV 91.1  --  91.6  MCH 29.4  --  28.7  MCHC 32.3  --  31.3  RDW 12.6  --  13.0  LYMPHSABS 0.8  --   --   MONOABS 0.6  --   --   EOSABS 0.1  --   --   BASOSABS 0.0  --   --     Recent Labs  Lab 11/20/22 2238 11/20/22 2257 11/20/22 2313 11/21/22 0145 11/21/22 0510 11/21/22 2253 11/22/22 0424 11/22/22 0633  NA  --  136 137  --   --  134* 135  --   K  --  3.1* 3.2*  --   --  3.6 4.4  --   CL  --  108 107  --   --  107 108  --   CO2  --  18*  --   --   --  19* 18*  --   ANIONGAP  --  10  --   --   --  8 9  --   GLUCOSE  --  134* 132*  --   --  212* 211*  --   BUN  --  8 7*  --   --  6* 8  --   CREATININE  --  0.83 0.80  --   --  0.74 0.85  --   AST  --  20  --   --   --   --   --   --   ALT  --  13  --   --   --   --   --   --   ALKPHOS  --  87  --   --   --   --   --   --   BILITOT  --  1.8*  --   --   --   --   --   --   ALBUMIN  --  3.0*  --   --   --   --   --   --   CRP  --   --   --   --   --   --   --  20.8*  LATICACIDVEN  2.0*  --   --  3.2* 1.7  --   --   --   INR  --  1.4*  --   --   --   --   --   --   TSH  --   --   --   --   --   --   --  2.826  BNP  --   --   --   --   --   --   --  203.2*  MG  --   --   --  1.7  --  2.0  --  2.3  CALCIUM  --  8.4*  --   --   --  8.4* 8.4*  --       Recent Labs  Lab 11/20/22 2238 11/20/22 2257 11/21/22 0145 11/21/22 0510 11/21/22 2253 11/22/22 0424 11/22/22 0633  CRP  --   --   --   --   --   --  20.8*  LATICACIDVEN 2.0*  --  3.2* 1.7  --   --   --   INR  --  1.4*  --   --   --   --   --   TSH  --   --   --   --   --   --  2.826  BNP  --   --   --   --   --   --  203.2*  MG  --   --  1.7  --  2.0  --  2.3  CALCIUM  --  8.4*  --   --  8.4* 8.4*  --    Micro Results Recent Results (from the past 240 hour(s))  Blood Culture (routine x 2)     Status: None (Preliminary result)   Collection Time: 11/20/22  11:00 PM   Specimen: BLOOD LEFT WRIST  Result Value Ref Range Status   Specimen Description BLOOD LEFT WRIST  Final   Special Requests   Final    BOTTLES DRAWN AEROBIC AND ANAEROBIC Blood Culture results may not be optimal due to an inadequate volume of blood received in culture bottles   Culture   Final    NO GROWTH 2 DAYS Performed at [facility], [address], [place], [place] [address]    Report Status PENDING  Incomplete  Blood Culture (routine x 2)     Status: None (Preliminary result)   Collection Time: 11/20/22 11:00 PM   Specimen: BLOOD RIGHT WRIST  Result Value Ref Range Status   Specimen Description BLOOD RIGHT WRIST  Final   Special Requests   Final    BOTTLES DRAWN AEROBIC AND ANAEROBIC Blood Culture results may not be optimal due to an excessive volume of blood received in culture bottles   Culture   Final    NO GROWTH 2 DAYS Performed at [facility], [address], [place], [place] [address]    Report Status PENDING  Incomplete    Radiology Reports CT ABDOMEN PELVIS W CONTRAST  Result Date: 11/21/2022 CLINICAL  DATA:  RLQ abdominal pain EXAM: CT ABDOMEN AND PELVIS WITH CONTRAST TECHNIQUE: Multidetector CT imaging of the abdomen and pelvis was performed using the standard protocol following bolus administration of intravenous contrast. RADIATION DOSE REDUCTION: This exam was performed according to the departmental dose-optimization program which includes automated exposure control, adjustment of the mA and/or kV according to patient size and/or use of iterative reconstruction technique. CONTRAST:  75mL OMNIPAQUE IOHEXOL 350 MG/ML SOLN COMPARISON:  None Available. FINDINGS: Lower chest: Left base atelectasis. Hepatobiliary: No focal liver abnormality. Status post cholecystectomy. No biliary dilatation. Pneumobilia. Pancreas: Diffusely atrophic. No focal lesion. Otherwise normal pancreatic contour. No surrounding inflammatory changes. No main pancreatic ductal dilatation. Spleen: Normal in size without focal abnormality. Adrenals/Urinary Tract: No adrenal nodule bilaterally. Bilateral kidneys enhance symmetrically. No hydronephrosis. No hydroureter. Subcentimeter hypodensities too small To characterize. The urinary bladder is unremarkable. On delayed imaging, there is no urothelial wall thickening and there are no filling defects in the opacified portions of the bilateral collecting systems or ureters. Stomach/Bowel: Possible small bowel malrotation with no volvulus. Stomach is within normal limits. No evidence of bowel wall thickening or dilatation. Reactive changes of the small bowel within the right lower quadrant. Right lower quadrant inflammatory changes with fat stranding and trace free fluid. Question inflamed appendix (6:26) best seen on coronal with possible appendiceal wall discontinuity. Question appendicolith. Vascular/Lymphatic: Bilateral phleboliths along the ovaries. Stomach is within normal limits. No evidence of bowel wall thickening or dilatation. Appendix appears normal. Reproductive: Uterus and bilateral  adnexa are unremarkable. Other: No intraperitoneal free fluid. No intraperitoneal free gas. No organized fluid collection. Musculoskeletal: No abdominal wall hernia or abnormality. No suspicious lytic or blastic osseous lesions. No acute displaced fracture. Multilevel degenerative changes of the spine. IMPRESSION: 1. Acute appendicitis-likely ruptured. Question appendicoliths. No abscess formation. 2. Colonic diverticulosis with no acute diverticulitis. Electronically Signed   By: Tish Frederickson M.D.   On: 11/21/2022 00:43      Signature  -   Susa Raring M.D on 11/22/2022 at 9:19 AM   -  To page go to [email or web address]

## 2022-11-22 NOTE — Progress Notes (Signed)
  X-cover Note: Bedside RN communicated pt with increasingly rapid afib. Cardizem gtts increased to 15 mg/hr. Stat BMP and Mg ordered. IV lopressor 2.5 mg ordered.  Will replete K and Mg.  Carollee Herter, DO [facility]

## 2022-11-22 NOTE — Progress Notes (Signed)
Around 2210 on 11/21/22, call received from CCMD, pt in Afib with RVR, HR 105-120, frequently goes up to 130s and 140s. Other VS stable, pt anxious stating she can feel her heart rate went up.  Pt on Cardizem drip 10mg /hr, increased to 15mg /hr per order, Metoprolol 25 mg PO given at 2040. Pt getting LR with potassium chloride at 151ml/hr. No acute distress noted on assessment. Imogene Burn, MD paged. EKG completed as ordered, and MD notified. New orders for labs placed, MD notified of results. Metoprolol IV push given as ordered. Magnesium and potassium given as ordered. At 2300 pt HR went to 58-65 BPM, Cardizem paused and Imogene Burn, MD notified. Systolic BP soft but still remains>90 with MAP>65. RN instructed by Imogene Burn, MD to decrease Cardizem to 5mg /hr.

## 2022-11-22 NOTE — Progress Notes (Signed)
Pt ambulated with walker 172ft, no difficulties, tolerated well.

## 2022-11-22 NOTE — Anesthesia Postprocedure Evaluation (Signed)
Anesthesia Post Note  Patient: Jessica Fowler  Procedure(s) Performed: APPENDECTOMY LAPAROSCOPIC (Abdomen)     Patient location during evaluation: PACU Anesthesia Type: General Level of consciousness: patient cooperative and awake Pain management: pain level controlled Vital Signs Assessment: post-procedure vital signs reviewed and stable Respiratory status: spontaneous breathing, nonlabored ventilation, respiratory function stable and patient connected to nasal cannula oxygen Cardiovascular status: blood pressure returned to baseline and stable Postop Assessment: no apparent nausea or vomiting Anesthetic complications: no   No notable events documented.  Last Vitals:  Vitals:   11/22/22 0008 11/22/22 0103  BP: (!) 95/51   Pulse: 61 (!) 58  Resp: 20 18  Temp:    SpO2: 97% 97%    Last Pain:  Vitals:   11/21/22 2000  TempSrc: Oral  PainSc:                  Mattison Stuckey

## 2022-11-22 NOTE — Progress Notes (Signed)
1 Day Post-Op  Subjective: Patient feels ok today.  Having some central lower pain in her abdomen as expected.  No bowel function.  No nausea.   ROS: See above, otherwise other systems negative  Objective: Vital signs in last 24 hours: Temp:  [97.5 F (36.4 C)-98.4 F (36.9 C)] 97.7 F (36.5 C) (06/24 0900) Pulse Rate:  [57-123] 64 (06/24 0900) Resp:  [11-26] 17 (06/24 0330) BP: (92-124)/(43-111) 110/59 (06/24 0900) SpO2:  [92 %-98 %] 94 % (06/24 0600) Weight:  [64 kg] 64 kg (06/23 1005) Last BM Date : 11/21/22  Intake/Output from previous day: 06/23 0701 - 06/24 0700 In: 1590.4 [I.V.:1080.3; IV Piggyback:510.2] Out: 3205 [Urine:3200; Blood:5] Intake/Output this shift: No intake/output data recorded.  PE: Heart: irregular Lungs: normal respiratory effort Abd: soft, appropriately tender, +BS, incisions c/d/i  Lab Results:  Recent Labs    11/20/22 2257 11/20/22 2313 11/22/22 0424  WBC 17.7*  --  25.0*  HGB 12.3 12.6 11.3*  HCT 38.1 37.0 36.1  PLT 236  --  222   BMET Recent Labs    11/21/22 2253 11/22/22 0424  NA 134* 135  K 3.6 4.4  CL 107 108  CO2 19* 18*  GLUCOSE 212* 211*  BUN 6* 8  CREATININE 0.74 0.85  CALCIUM 8.4* 8.4*   PT/INR Recent Labs    11/20/22 2257  LABPROT 17.1*  INR 1.4*   CMP     Component Value Date/Time   NA 135 11/22/2022 0424   NA 142 06/02/2017 0955   K 4.4 11/22/2022 0424   CL 108 11/22/2022 0424   CO2 18 (L) 11/22/2022 0424   GLUCOSE 211 (H) 11/22/2022 0424   BUN 8 11/22/2022 0424   BUN 10 06/02/2017 0955   CREATININE 0.85 11/22/2022 0424   CREATININE 0.86 01/02/2016 1153   CALCIUM 8.4 (L) 11/22/2022 0424   PROT 5.9 (L) 11/20/2022 2257   ALBUMIN 3.0 (L) 11/20/2022 2257   AST 20 11/20/2022 2257   ALT 13 11/20/2022 2257   ALKPHOS 87 11/20/2022 2257   BILITOT 1.8 (H) 11/20/2022 2257   GFRNONAA >60 11/22/2022 0424   GFRAA 86 06/02/2017 0955   Lipase  No results found for:  "LIPASE"     Studies/Results: CT ABDOMEN PELVIS W CONTRAST  Result Date: 11/21/2022 CLINICAL DATA:  RLQ abdominal pain EXAM: CT ABDOMEN AND PELVIS WITH CONTRAST TECHNIQUE: Multidetector CT imaging of the abdomen and pelvis was performed using the standard protocol following bolus administration of intravenous contrast. RADIATION DOSE REDUCTION: This exam was performed according to the departmental dose-optimization program which includes automated exposure control, adjustment of the mA and/or kV according to patient size and/or use of iterative reconstruction technique. CONTRAST:  75mL OMNIPAQUE IOHEXOL 350 MG/ML SOLN COMPARISON:  None Available. FINDINGS: Lower chest: Left base atelectasis. Hepatobiliary: No focal liver abnormality. Status post cholecystectomy. No biliary dilatation. Pneumobilia. Pancreas: Diffusely atrophic. No focal lesion. Otherwise normal pancreatic contour. No surrounding inflammatory changes. No main pancreatic ductal dilatation. Spleen: Normal in size without focal abnormality. Adrenals/Urinary Tract: No adrenal nodule bilaterally. Bilateral kidneys enhance symmetrically. No hydronephrosis. No hydroureter. Subcentimeter hypodensities too small To characterize. The urinary bladder is unremarkable. On delayed imaging, there is no urothelial wall thickening and there are no filling defects in the opacified portions of the bilateral collecting systems or ureters. Stomach/Bowel: Possible small bowel malrotation with no volvulus. Stomach is within normal limits. No evidence of bowel wall thickening or dilatation. Reactive changes of the small bowel within the right  lower quadrant. Right lower quadrant inflammatory changes with fat stranding and trace free fluid. Question inflamed appendix (6:26) best seen on coronal with possible appendiceal wall discontinuity. Question appendicolith. Vascular/Lymphatic: Bilateral phleboliths along the ovaries. Stomach is within normal limits. No evidence  of bowel wall thickening or dilatation. Appendix appears normal. Reproductive: Uterus and bilateral adnexa are unremarkable. Other: No intraperitoneal free fluid. No intraperitoneal free gas. No organized fluid collection. Musculoskeletal: No abdominal wall hernia or abnormality. No suspicious lytic or blastic osseous lesions. No acute displaced fracture. Multilevel degenerative changes of the spine. IMPRESSION: 1. Acute appendicitis-likely ruptured. Question appendicoliths. No abscess formation. 2. Colonic diverticulosis with no acute diverticulitis. Electronically Signed   By: Tish Frederickson M.D.   On: 11/21/2022 00:43    Anti-infectives: Anti-infectives (From admission, onward)    Start     Dose/Rate Route Frequency Ordered Stop   11/21/22 1400  piperacillin-tazobactam (ZOSYN) IVPB 3.375 g        3.375 g 12.5 mL/hr over 240 Minutes Intravenous Every 8 hours 11/21/22 0819     11/21/22 1349  cefTRIAXone (ROCEPHIN) 2 g in sodium chloride 0.9 % 100 mL IVPB  Status:  Discontinued       See Hyperspace for full Linked Orders Report.   2 g 200 mL/hr over 30 Minutes Intravenous Every 24 hours 11/21/22 1350 11/22/22 0916   11/21/22 1349  metroNIDAZOLE (FLAGYL) IVPB 500 mg  Status:  Discontinued       See Hyperspace for full Linked Orders Report.   500 mg 100 mL/hr over 60 Minutes Intravenous Every 12 hours 11/21/22 1350 11/22/22 0916   11/21/22 0830  piperacillin-tazobactam (ZOSYN) IVPB 3.375 g        3.375 g 100 mL/hr over 30 Minutes Intravenous  Once 11/21/22 0817 11/21/22 1610        Assessment/Plan POD 1, s/p lap appy for perforated appendicitis, Dr. Derrell Lolling 11/21/22 -cont abx for 5 days, on zosyn -CLD, keep for now and go slow due to risk of ileus -WBC up to 25K today but suspect this is reactive post op.  Should trend down.  Will follow -pulm toilet/mobilize -discussed with patient and family member at bedside   FEN - CLD/IVFs per primary VTE - Lovenox ID - zosyn  A fib HLD     LOS: 1 day    Letha Cape , [facility] 11/22/2022, 10:02 AM Please see Amion for pager number during day hours 7:00am-4:30pm or 7:00am -11:30am on weekends

## 2022-11-22 NOTE — Plan of Care (Signed)

## 2022-11-22 NOTE — Progress Notes (Signed)
PT Cancellation Note  Patient Details Name: Jessica Fowler MRN: [id number] DOB: 1946-01-27   Cancelled Treatment:    Reason Eval/Treat Not Completed: Other (comment).  Declining PT so will retry as time and pt allow.   Ivar Drape 11/22/2022, 11:22 AM  Samul Dada, PT PhD Acute Rehab Dept. Number: [facility] [id number] and [facility] [phone]

## 2022-11-22 NOTE — Progress Notes (Signed)
Pharmacy Antibiotic Note  COLBIE SLIKER is a 77 y.o. female admitted on 11/20/2022 with  acute appendicitis with c/f rupture .  Pharmacy has been consulted for zosyn dosing.  S/p lap appy. Plan to continue for 5d post op. Stop date has been entered   Plan: Zosyn 3.375 g IV q8h by extended infusion Rx off  Height: 5\' 5"  (165.1 cm) Weight: 64 kg (141 lb) IBW/kg (Calculated) : 57  Temp (24hrs), Avg:97.9 F (36.6 C), Min:97.5 F (36.4 C), Max:98.4 F (36.9 C)  Recent Labs  Lab 11/20/22 2238 11/20/22 2257 11/20/22 2313 11/21/22 0145 11/21/22 0510 11/21/22 2253 11/22/22 0424  WBC  --  17.7*  --   --   --   --  25.0*  CREATININE  --  0.83 0.80  --   --  0.74 0.85  LATICACIDVEN 2.0*  --   --  3.2* 1.7  --   --      Estimated Creatinine Clearance: 49.9 mL/min (by C-G formula based on SCr of 0.85 mg/dL).    Allergies  Allergen Reactions   Pollen Extract Itching   Brimonidine Tartrate Rash    Other reaction(s): redness    Antimicrobials this admission: Zosyn 6/23>6/28  Dose adjustments this admission:   Microbiology results: 6/23 BCX: ngtd  Ulyses Southward, PharmD, BCIDP, AAHIVP, CPP Infectious Disease Pharmacist 11/22/2022 11:34 AM     Thank you for allowing pharmacy to be a part of this patient's care.  Calton Dach, PharmD Clinical Pharmacist 11/22/2022 11:32 AM

## 2022-11-23 DIAGNOSIS — K352 Acute appendicitis with generalized peritonitis, without perforation or abscess: Secondary | ICD-10-CM | POA: Diagnosis not present

## 2022-11-23 LAB — CBC WITH DIFFERENTIAL/PLATELET
Abs Immature Granulocytes: 0.17 10*3/uL — ABNORMAL HIGH (ref 0.00–0.07)
Basophils Absolute: 0 10*3/uL (ref 0.0–0.1)
Basophils Relative: 0 %
Eosinophils Absolute: 0 10*3/uL (ref 0.0–0.5)
Eosinophils Relative: 0 %
HCT: 32.8 % — ABNORMAL LOW (ref 36.0–46.0)
Hemoglobin: 10.4 g/dL — ABNORMAL LOW (ref 12.0–15.0)
Immature Granulocytes: 1 %
Lymphocytes Relative: 4 %
Lymphs Abs: 0.8 10*3/uL (ref 0.7–4.0)
MCH: 29 pg (ref 26.0–34.0)
MCHC: 31.7 g/dL (ref 30.0–36.0)
MCV: 91.4 fL (ref 80.0–100.0)
Monocytes Absolute: 0.7 10*3/uL (ref 0.1–1.0)
Monocytes Relative: 4 %
Neutro Abs: 17.1 10*3/uL — ABNORMAL HIGH (ref 1.7–7.7)
Neutrophils Relative %: 91 %
Platelets: 231 10*3/uL (ref 150–400)
RBC: 3.59 MIL/uL — ABNORMAL LOW (ref 3.87–5.11)
RDW: 13.1 % (ref 11.5–15.5)
WBC: 18.7 10*3/uL — ABNORMAL HIGH (ref 4.0–10.5)
nRBC: 0 % (ref 0.0–0.2)

## 2022-11-23 LAB — C-REACTIVE PROTEIN: CRP: 14.5 mg/dL — ABNORMAL HIGH (ref <1.0)

## 2022-11-23 LAB — BASIC METABOLIC PANEL
Anion gap: 9 (ref 5–15)
BUN: 8 mg/dL (ref 8–23)
CO2: 22 mmol/L (ref 22–32)
Calcium: 8.5 mg/dL — ABNORMAL LOW (ref 8.9–10.3)
Chloride: 106 mmol/L (ref 98–111)
Creatinine, Ser: 0.9 mg/dL (ref 0.44–1.00)
GFR, Estimated: >60 mL/min (ref >60)
Glucose, Bld: 159 mg/dL — ABNORMAL HIGH (ref 70–99)
Potassium: 4.2 mmol/L (ref 3.5–5.1)
Sodium: 137 mmol/L (ref 135–145)

## 2022-11-23 LAB — PROCALCITONIN: Procalcitonin: 5.01 ng/mL

## 2022-11-23 LAB — SURGICAL PATHOLOGY

## 2022-11-23 LAB — MAGNESIUM: Magnesium: 1.9 mg/dL (ref 1.7–2.4)

## 2022-11-23 LAB — GLUCOSE, CAPILLARY
Glucose-Capillary: 148 mg/dL — ABNORMAL HIGH (ref 70–99)
Glucose-Capillary: 83 mg/dL (ref 70–99)
Glucose-Capillary: 224 mg/dL — ABNORMAL HIGH (ref 70–99)
Glucose-Capillary: 219 mg/dL — ABNORMAL HIGH (ref 70–99)
Glucose-Capillary: 171 mg/dL — ABNORMAL HIGH (ref 70–99)

## 2022-11-23 LAB — BRAIN NATRIURETIC PEPTIDE: B Natriuretic Peptide: 311.7 pg/mL — ABNORMAL HIGH (ref 0.0–100.0)

## 2022-11-23 MED ORDER — DILTIAZEM HCL 25 MG/5ML IV SOLN: 10.0000 mg | Freq: Four times a day (QID) | INTRAVENOUS | Status: DC | PRN

## 2022-11-23 MED ORDER — MORPHINE SULFATE (PF) 2 MG/ML IV SOLN: 1.0000 mg | INTRAVENOUS | Status: DC | PRN

## 2022-11-23 MED ORDER — INSULIN ASPART 100 UNIT/ML IJ SOLN
0.0000 [IU] | Freq: Three times a day (TID) | INTRAMUSCULAR | Status: DC
  Administered 2022-11-24: 1 [IU] via SUBCUTANEOUS
  Administered 2022-11-24: 2 [IU] via SUBCUTANEOUS

## 2022-11-23 MED ORDER — PANTOPRAZOLE SODIUM 40 MG PO TBEC
40.0000 mg | DELAYED_RELEASE_TABLET | Freq: Every day | ORAL | Status: DC
  Administered 2022-11-24 – 2022-11-25 (×2): 40 mg via ORAL
  Filled 2022-11-23 (×2): qty 1

## 2022-11-23 MED ORDER — LACTATED RINGERS IV SOLN: INTRAVENOUS | Status: AC

## 2022-11-23 NOTE — Progress Notes (Signed)
2 Days Post-Op  Subjective: Tolerated CLD yesterday.  No nausea.  + flatus.  Up to restroom only yesterday.  Objective: Vital signs in last 24 hours: Temp:  [97.5 F (36.4 C)-97.7 F (36.5 C)] 97.7 F (36.5 C) (06/25 0800) Pulse Rate:  [50-69] 50 (06/25 0400) Resp:  [14-23] 14 (06/25 0400) BP: (100-135)/(53-109) 103/62 (06/25 0400) SpO2:  [89 %-96 %] 92 % (06/25 0400) Last BM Date : 11/21/22  Intake/Output from previous day: 06/24 0701 - 06/25 0700 In: -  Out: 700 [Urine:700] Intake/Output this shift: No intake/output data recorded.  PE: Heart: irregular Lungs: normal respiratory effort Abd: soft, appropriately tender, +BS, incisions c/d/i  Lab Results:  Recent Labs    11/22/22 0424 11/23/22 0241  WBC 25.0* 18.7*  HGB 11.3* 10.4*  HCT 36.1 32.8*  PLT 222 231   BMET Recent Labs    11/22/22 0424 11/23/22 0241  NA 135 137  K 4.4 4.2  CL 108 106  CO2 18* 22  GLUCOSE 211* 159*  BUN 8 8  CREATININE 0.85 0.90  CALCIUM 8.4* 8.5*   PT/INR Recent Labs    11/20/22 2257  LABPROT 17.1*  INR 1.4*   CMP     Component Value Date/Time   NA 137 11/23/2022 0241   NA 142 06/02/2017 0955   K 4.2 11/23/2022 0241   CL 106 11/23/2022 0241   CO2 22 11/23/2022 0241   GLUCOSE 159 (H) 11/23/2022 0241   BUN 8 11/23/2022 0241   BUN 10 06/02/2017 0955   CREATININE 0.90 11/23/2022 0241   CREATININE 0.86 01/02/2016 1153   CALCIUM 8.5 (L) 11/23/2022 0241   PROT 5.9 (L) 11/20/2022 2257   ALBUMIN 3.0 (L) 11/20/2022 2257   AST 20 11/20/2022 2257   ALT 13 11/20/2022 2257   ALKPHOS 87 11/20/2022 2257   BILITOT 1.8 (H) 11/20/2022 2257   GFRNONAA >60 11/23/2022 0241   GFRAA 86 06/02/2017 0955   Lipase  No results found for: "LIPASE"     Studies/Results: No results found.  Anti-infectives: Anti-infectives (From admission, onward)    Start     Dose/Rate Route Frequency Ordered Stop   11/21/22 1400  piperacillin-tazobactam (ZOSYN) IVPB 3.375 g        3.375  g 12.5 mL/hr over 240 Minutes Intravenous Every 8 hours 11/21/22 0819 11/26/22 1359   11/21/22 1349  cefTRIAXone (ROCEPHIN) 2 g in sodium chloride 0.9 % 100 mL IVPB  Status:  Discontinued       See Hyperspace for full Linked Orders Report.   2 g 200 mL/hr over 30 Minutes Intravenous Every 24 hours 11/21/22 1350 11/22/22 0916   11/21/22 1349  metroNIDAZOLE (FLAGYL) IVPB 500 mg  Status:  Discontinued       See Hyperspace for full Linked Orders Report.   500 mg 100 mL/hr over 60 Minutes Intravenous Every 12 hours 11/21/22 1350 11/22/22 0916   11/21/22 0830  piperacillin-tazobactam (ZOSYN) IVPB 3.375 g        3.375 g 100 mL/hr over 30 Minutes Intravenous  Once 11/21/22 0817 11/21/22 5284        Assessment/Plan POD 2, s/p lap appy for perforated appendicitis, Dr. Derrell Lolling 11/21/22 -cont abx for 5 days, on zosyn -FLD, go slow due to risk of ileus.  So far + bowel function and BS -WBC down to 18K today -pulm toilet/mobilize -discussed with patient and daughter at bedside   FEN - FLD/IVFs per primary VTE - Lovenox ID - zosyn  A fib  HLD    LOS: 2 days    Letha Cape , [facility] 11/23/2022, 9:06 AM Please see Amion for pager number during day hours 7:00am-4:30pm or 7:00am -11:30am on weekends

## 2022-11-23 NOTE — Progress Notes (Addendum)
PROGRESS NOTE                                                                                                                                                                                                             Patient Demographics:    Jessica Fowler, is a 77 y.o. female, DOB - 02/22/1946, ZOX:[id number]  Outpatient Primary MD for the patient is [name], MD    LOS - 2  Admit date - 11/20/2022    Chief Complaint  Patient presents with   Abdominal Pain R/O Appendicitis       Brief Narrative (HPI from H&P)   77 y.o. female with medical history significant of Atrial Fibrillation not compliant with outpatient anticoagulation secondary to cost and mild cognitive decline.  Scented to [facility] ER after nausea vomiting and abdominal pain was found to have a ruptured appendicitis and was transferred to [facility], seen by general surgery, went into A-fib RVR placed on Cardizem drip subsequently taken to the OR for laparoscopic appendectomy.  Transferred to my service on 11/22/2022.   Subjective:   Patient in bed, appears comfortable, denies any headache, no fever, no chest pain or pressure, no shortness of breath , no abdominal pain, passing flatus. No new focal weakness.   Assessment  & Plan :   Acute appendicitis.  Seen by general surgery underwent laparoscopic appendectomy by Dr. Derrell Lolling on 11/21/2022, continue empiric antibiotics, has some bowel function but not completely back to baseline yet, continue to monitor closely, gentle IV fluids, advance activity, encouraged to use I-S.  Daughter updated bedside on 11/22/2022.  Early sepsis resolved.  Paroxysmal A-fib and RVR.  Placed on Cardizem drip, [place] vas 2 score is greater than 3 but she is not on anticoagulation at home, she takes low-dose beta-blocker and as needed oral Cardizem at home, was briefly on IV Cardizem here now titrated off, heart rate stable,  continue low-dose beta-blocker and monitor stable TSH.  Hypertension.  Blood pressure soft for now oral Cardizem, once blood pressure is improved home beta-blocker will be reintroduced, she takes Cardizem as needed at home.      Condition - Fair  Family Communication  :  daughter bedside 11/22/22, 11/23/22  Code Status :  Full  Consults  :  CCS   PUD Prophylaxis : PPI   Procedures  :  Laparoscopic appendectomy by Dr. Derrell Lolling on 11/21/2022.    CT - 1. Acute appendicitis-likely ruptured. Question appendicoliths. No abscess formation. 2. Colonic diverticulosis with no acute diverticulitis.      Disposition Plan  :    Status is: Inpatient  DVT Prophylaxis  :    enoxaparin (LOVENOX) injection 40 mg Start: 11/22/22 0800 SCD's Start: 11/21/22 1350    Lab Results  Component Value Date   PLT 231 11/23/2022    Diet :  Diet Order             Diet clear liquid Room service appropriate? Yes; Fluid consistency: Thin  Diet effective now                    Inpatient Medications  Scheduled Meds:  acetaminophen  1,000 mg Oral Q6H   diltiazem  60 mg Oral Q8H   dorzolamide-timolol  1 drop Both Eyes BID   enoxaparin (LOVENOX) injection  40 mg Subcutaneous Q24H   latanoprost  1 drop Both Eyes QHS   metoprolol succinate  25 mg Oral q morning   pantoprazole (PROTONIX) IV  40 mg Intravenous Q24H   sodium chloride flush  3 mL Intravenous Q12H   Continuous Infusions:  lactated ringers 1,000 mL with potassium chloride 30 mEq infusion 75 mL/hr at 11/23/22 0631   methocarbamol (ROBAXIN) IV     piperacillin-tazobactam (ZOSYN)  IV 3.375 g (11/23/22 0637)   PRN Meds:.methocarbamol (ROBAXIN) IV, morphine injection, nitroGLYCERIN, ondansetron **OR** ondansetron (ZOFRAN) IV, ondansetron **OR** ondansetron (ZOFRAN) IV, polyethylene glycol, traMADol    Objective:   Vitals:   11/22/22 2349 11/23/22 0000 11/23/22 0200 11/23/22 0400  BP: 115/67 111/65  103/62  Pulse: 62 (!) 55 (!)  50 (!) 50  Resp: 18 14 14 14   Temp: 97.6 F (36.4 C)   (!) 97.5 F (36.4 C)  TempSrc: Oral   Oral  SpO2: 93% 91% 91% 92%  Weight:      Height:        Wt Readings from Last 3 Encounters:  11/21/22 64 kg  10/26/22 64 kg  10/05/22 61.7 kg     Intake/Output Summary (Last 24 hours) at 11/23/2022 0839 Last data filed at 11/23/2022 0500 Gross per 24 hour  Intake --  Output 700 ml  Net -700 ml     Physical Exam  Awake Alert, No new F.N deficits, Normal affect Johnson.AT,PERRAL Supple Neck, No JVD,   Symmetrical Chest wall movement, Good air movement bilaterally, CTAB RRR,No Gallops,Rubs or new Murmurs,  Hypoactive but +ve B.Sounds, Abd Soft, midline abdominal incision under bandage, mild postop abdominal tenderness No Cyanosis, Clubbing or edema       Data Review:    Recent Labs  Lab 11/20/22 2257 11/20/22 2313 11/22/22 0424 11/23/22 0241  WBC 17.7*  --  25.0* 18.7*  HGB 12.3 12.6 11.3* 10.4*  HCT 38.1 37.0 36.1 32.8*  PLT 236  --  222 231  MCV 91.1  --  91.6 91.4  MCH 29.4  --  28.7 29.0  MCHC 32.3  --  31.3 31.7  RDW 12.6  --  13.0 13.1  LYMPHSABS 0.8  --   --  0.8  MONOABS 0.6  --   --  0.7  EOSABS 0.1  --   --  0.0  BASOSABS 0.0  --   --  0.0    Recent Labs  Lab 11/20/22 2238 11/20/22 2257 11/20/22 2313 11/21/22 0145 11/21/22 0510 11/21/22 2253 11/22/22 0424 11/22/22 1610  11/23/22 0241  NA  --  136 137  --   --  134* 135  --  137  K  --  3.1* 3.2*  --   --  3.6 4.4  --  4.2  CL  --  108 107  --   --  107 108  --  106  CO2  --  18*  --   --   --  19* 18*  --  22  ANIONGAP  --  10  --   --   --  8 9  --  9  GLUCOSE  --  134* 132*  --   --  212* 211*  --  159*  BUN  --  8 7*  --   --  6* 8  --  8  CREATININE  --  0.83 0.80  --   --  0.74 0.85  --  0.90  AST  --  20  --   --   --   --   --   --   --   ALT  --  13  --   --   --   --   --   --   --   ALKPHOS  --  87  --   --   --   --   --   --   --   BILITOT  --  1.8*  --   --   --   --   --   --    --   ALBUMIN  --  3.0*  --   --   --   --   --   --   --   CRP  --   --   --   --   --   --   --  20.8* 14.5*  PROCALCITON  --   --   --   --   --   --   --  7.11 5.01  LATICACIDVEN 2.0*  --   --  3.2* 1.7  --   --   --   --   INR  --  1.4*  --   --   --   --   --   --   --   TSH  --   --   --   --   --   --   --  2.826  --   BNP  --   --   --   --   --   --   --  203.2* 311.7*  MG  --   --   --  1.7  --  2.0  --  2.3 1.9  CALCIUM  --  8.4*  --   --   --  8.4* 8.4*  --  8.5*      Recent Labs  Lab 11/20/22 2238 11/20/22 2257 11/21/22 0145 11/21/22 0510 11/21/22 2253 11/22/22 0424 11/22/22 0633 11/23/22 0241  CRP  --   --   --   --   --   --  20.8* 14.5*  PROCALCITON  --   --   --   --   --   --  7.11 5.01  LATICACIDVEN 2.0*  --  3.2* 1.7  --   --   --   --   INR  --  1.4*  --   --   --   --   --   --  TSH  --   --   --   --   --   --  2.826  --   BNP  --   --   --   --   --   --  203.2* 311.7*  MG  --   --  1.7  --  2.0  --  2.3 1.9  CALCIUM  --  8.4*  --   --  8.4* 8.4*  --  8.5*   Micro Results Recent Results (from the past 240 hour(s))  Blood Culture (routine x 2)     Status: None (Preliminary result)   Collection Time: 11/20/22 11:00 PM   Specimen: BLOOD LEFT WRIST  Result Value Ref Range Status   Specimen Description BLOOD LEFT WRIST  Final   Special Requests   Final    BOTTLES DRAWN AEROBIC AND ANAEROBIC Blood Culture results may not be optimal due to an inadequate volume of blood received in culture bottles   Culture   Final    NO GROWTH 2 DAYS Performed at [facility], [address], [place], [place] [address]    Report Status PENDING  Incomplete  Blood Culture (routine x 2)     Status: None (Preliminary result)   Collection Time: 11/20/22 11:00 PM   Specimen: BLOOD RIGHT WRIST  Result Value Ref Range Status   Specimen Description BLOOD RIGHT WRIST  Final   Special Requests   Final    BOTTLES DRAWN AEROBIC AND ANAEROBIC Blood Culture results may not  be optimal due to an excessive volume of blood received in culture bottles   Culture   Final    NO GROWTH 2 DAYS Performed at [facility], [address], [place], [place] [address]    Report Status PENDING  Incomplete    Radiology Reports CT ABDOMEN PELVIS W CONTRAST  Result Date: 11/21/2022 CLINICAL DATA:  RLQ abdominal pain EXAM: CT ABDOMEN AND PELVIS WITH CONTRAST TECHNIQUE: Multidetector CT imaging of the abdomen and pelvis was performed using the standard protocol following bolus administration of intravenous contrast. RADIATION DOSE REDUCTION: This exam was performed according to the departmental dose-optimization program which includes automated exposure control, adjustment of the mA and/or kV according to patient size and/or use of iterative reconstruction technique. CONTRAST:  75mL OMNIPAQUE IOHEXOL 350 MG/ML SOLN COMPARISON:  None Available. FINDINGS: Lower chest: Left base atelectasis. Hepatobiliary: No focal liver abnormality. Status post cholecystectomy. No biliary dilatation. Pneumobilia. Pancreas: Diffusely atrophic. No focal lesion. Otherwise normal pancreatic contour. No surrounding inflammatory changes. No main pancreatic ductal dilatation. Spleen: Normal in size without focal abnormality. Adrenals/Urinary Tract: No adrenal nodule bilaterally. Bilateral kidneys enhance symmetrically. No hydronephrosis. No hydroureter. Subcentimeter hypodensities too small To characterize. The urinary bladder is unremarkable. On delayed imaging, there is no urothelial wall thickening and there are no filling defects in the opacified portions of the bilateral collecting systems or ureters. Stomach/Bowel: Possible small bowel malrotation with no volvulus. Stomach is within normal limits. No evidence of bowel wall thickening or dilatation. Reactive changes of the small bowel within the right lower quadrant. Right lower quadrant inflammatory changes with fat stranding and trace free fluid. Question  inflamed appendix (6:26) best seen on coronal with possible appendiceal wall discontinuity. Question appendicolith. Vascular/Lymphatic: Bilateral phleboliths along the ovaries. Stomach is within normal limits. No evidence of bowel wall thickening or dilatation. Appendix appears normal. Reproductive: Uterus and bilateral adnexa are unremarkable. Other: No intraperitoneal free fluid. No intraperitoneal free gas. No organized  fluid collection. Musculoskeletal: No abdominal wall hernia or abnormality. No suspicious lytic or blastic osseous lesions. No acute displaced fracture. Multilevel degenerative changes of the spine. IMPRESSION: 1. Acute appendicitis-likely ruptured. Question appendicoliths. No abscess formation. 2. Colonic diverticulosis with no acute diverticulitis. Electronically Signed   By: Tish Frederickson M.D.   On: 11/21/2022 00:43      Signature  -   Susa Raring M.D on 11/23/2022 at 8:39 AM   -  To page go to [email or web address]

## 2022-11-24 DIAGNOSIS — K352 Acute appendicitis with generalized peritonitis, without perforation or abscess: Secondary | ICD-10-CM | POA: Diagnosis not present

## 2022-11-24 LAB — CBC WITH DIFFERENTIAL/PLATELET
Abs Immature Granulocytes: 0.03 10*3/uL (ref 0.00–0.07)
Basophils Absolute: 0 10*3/uL (ref 0.0–0.1)
Basophils Relative: 0 %
Eosinophils Absolute: 0 10*3/uL (ref 0.0–0.5)
Eosinophils Relative: 0 %
HCT: 30.1 % — ABNORMAL LOW (ref 36.0–46.0)
Hemoglobin: 9.5 g/dL — ABNORMAL LOW (ref 12.0–15.0)
Immature Granulocytes: 0 %
Lymphocytes Relative: 11 %
Lymphs Abs: 1 10*3/uL (ref 0.7–4.0)
MCH: 29.7 pg (ref 26.0–34.0)
MCHC: 31.6 g/dL (ref 30.0–36.0)
MCV: 94.1 fL (ref 80.0–100.0)
Monocytes Absolute: 0.5 10*3/uL (ref 0.1–1.0)
Monocytes Relative: 6 %
Neutro Abs: 7.5 10*3/uL (ref 1.7–7.7)
Neutrophils Relative %: 83 %
Platelets: 208 10*3/uL (ref 150–400)
RBC: 3.2 MIL/uL — ABNORMAL LOW (ref 3.87–5.11)
RDW: 13.1 % (ref 11.5–15.5)
WBC: 9.1 10*3/uL (ref 4.0–10.5)
nRBC: 0 % (ref 0.0–0.2)

## 2022-11-24 LAB — BASIC METABOLIC PANEL
Anion gap: 6 (ref 5–15)
BUN: 8 mg/dL (ref 8–23)
CO2: 23 mmol/L (ref 22–32)
Calcium: 8.1 mg/dL — ABNORMAL LOW (ref 8.9–10.3)
Chloride: 107 mmol/L (ref 98–111)
Creatinine, Ser: 0.84 mg/dL (ref 0.44–1.00)
GFR, Estimated: >60 mL/min (ref >60)
Glucose, Bld: 133 mg/dL — ABNORMAL HIGH (ref 70–99)
Potassium: 3.9 mmol/L (ref 3.5–5.1)
Sodium: 136 mmol/L (ref 135–145)

## 2022-11-24 LAB — MAGNESIUM: Magnesium: 1.8 mg/dL (ref 1.7–2.4)

## 2022-11-24 LAB — GLUCOSE, CAPILLARY
Glucose-Capillary: 92 mg/dL (ref 70–99)
Glucose-Capillary: 123 mg/dL — ABNORMAL HIGH (ref 70–99)
Glucose-Capillary: 201 mg/dL — ABNORMAL HIGH (ref 70–99)
Glucose-Capillary: 111 mg/dL — ABNORMAL HIGH (ref 70–99)

## 2022-11-24 LAB — PROCALCITONIN: Procalcitonin: 3.47 ng/mL

## 2022-11-24 LAB — CULTURE, BLOOD (ROUTINE X 2): Culture: NO GROWTH

## 2022-11-24 LAB — BRAIN NATRIURETIC PEPTIDE: B Natriuretic Peptide: 359.1 pg/mL — ABNORMAL HIGH (ref 0.0–100.0)

## 2022-11-24 LAB — C-REACTIVE PROTEIN: CRP: 6.6 mg/dL — ABNORMAL HIGH (ref <1.0)

## 2022-11-24 NOTE — Care Management Important Message (Signed)
Important Message  Patient Details  Name: Jessica Fowler MRN: [id number] Date of Birth: [birth year]   Medicare Important Message Given:  Yes     Dorena Bodo 11/24/2022, 4:23 PM

## 2022-11-24 NOTE — Progress Notes (Signed)
3 Days Post-Op   Subjective/Chief Complaint: Tol PO and havign BMs this AM   Objective: Vital signs in last 24 hours: Temp:  [97.2 F (36.2 C)-98.1 F (36.7 C)] 97.2 F (36.2 C) (06/26 0800) Pulse Rate:  [75] 75 (06/26 0000) Resp:  [19] 19 (06/26 0000) BP: (106-110)/(67-68) 110/67 (06/26 0000) SpO2:  [93 %] 93 % (06/26 0000) Last BM Date : 11/21/22  Intake/Output from previous day: 06/25 0701 - 06/26 0700 In: 1884.1 [P.O.:600; I.V.:1157.1; IV Piggyback:127.1] Out: -  Intake/Output this shift: No intake/output data recorded.  General appearance: alert and cooperative GI: soft, non-tender; bowel sounds normal; no masses,  no organomegaly and inc c/d/i  Lab Results:  Recent Labs    11/23/22 0241 11/24/22 0325  WBC 18.7* 9.1  HGB 10.4* 9.5*  HCT 32.8* 30.1*  PLT 231 208   BMET Recent Labs    11/23/22 0241 11/24/22 0325  NA 137 136  K 4.2 3.9  CL 106 107  CO2 22 23  GLUCOSE 159* 133*  BUN 8 8  CREATININE 0.90 0.84  CALCIUM 8.5* 8.1*   PT/INR No results for input(s): "LABPROT", "INR" in the last 72 hours. ABG No results for input(s): "PHART", "HCO3" in the last 72 hours.  Invalid input(s): "PCO2", "PO2"  Studies/Results: No results found.  Anti-infectives: Anti-infectives (From admission, onward)    Start     Dose/Rate Route Frequency Ordered Stop   11/21/22 1400  piperacillin-tazobactam (ZOSYN) IVPB 3.375 g        3.375 g 12.5 mL/hr over 240 Minutes Intravenous Every 8 hours 11/21/22 0819 11/26/22 1359   11/21/22 1349  cefTRIAXone (ROCEPHIN) 2 g in sodium chloride 0.9 % 100 mL IVPB  Status:  Discontinued       See Hyperspace for full Linked Orders Report.   2 g 200 mL/hr over 30 Minutes Intravenous Every 24 hours 11/21/22 1350 11/22/22 0916   11/21/22 1349  metroNIDAZOLE (FLAGYL) IVPB 500 mg  Status:  Discontinued       See Hyperspace for full Linked Orders Report.   500 mg 100 mL/hr over 60 Minutes Intravenous Every 12 hours 11/21/22 1350  11/22/22 0916   11/21/22 0830  piperacillin-tazobactam (ZOSYN) IVPB 3.375 g        3.375 g 100 mL/hr over 30 Minutes Intravenous  Once 11/21/22 0817 11/21/22 3295       Assessment/Plan: POD 3, s/p lap appy for perforated appendicitis, Dr. Derrell Lolling 11/21/22 -cont abx for 5 days, on zosyn -ADAT, -WBC nml -pulm toilet/mobilize -discussed with patient and daughter at bedside  -OK for DC when OK with primary team with abx   FEN - FLD/IVFs per primary VTE - Lovenox ID - zosyn   A fib HLD  LOS: 3 days    Axel Filler 11/24/2022

## 2022-11-24 NOTE — Progress Notes (Signed)
Progress Note    LORRETTA KERCE  ZOX:[id number] DOB: 03-26-1946  DOA: 11/20/2022 PCP: [name], MD      Brief Narrative:    Medical records reviewed and are as summarized below:  ANQUINETTE PIERRO is a 77 y.o. female  with medical history significant of Atrial Fibrillation not compliant with outpatient anticoagulation secondary to cost and mild cognitive decline.  Scented to [facility] ER after nausea vomiting and abdominal pain was found to have a ruptured appendicitis and was transferred to [facility] seen by general surgery, went into A-fib RVR placed on Cardizem drip subsequently taken to the OR for laparoscopic appendectomy.          Assessment/Plan:   Principal Problem:   Acute appendicitis Active Problems:   Atrial fibrillation with rapid ventricular response (HCC)    Body mass index is 23.46 kg/m.   Acute appendicitis.  Early sepsis resolved.  She was seen by general surgery underwent laparoscopic appendectomy by Dr. Derrell Lolling on 11/21/2022.  Discontinue IV fluids.  AdvanCe diet from full liquid to regular diet. Continue IV Zosyn. Discussed disposition.  However, patient said she did not feel ready to go home today.    Paroxysmal A-fib and RVR.   Heart rate is better.  Continue Toprol-XL.  Eliquis on hold. TSH is normal   Hypertension.   Continue Toprol-XL   Diet Order             Diet full liquid Room service appropriate? Yes; Fluid consistency: Thin  Diet effective now                            Consultants: General surgeon  Procedures: Laparoscopic appendectomy    Medications:    acetaminophen  1,000 mg Oral Q6H   dorzolamide-timolol  1 drop Both Eyes BID   enoxaparin (LOVENOX) injection  40 mg Subcutaneous Q24H   insulin aspart  0-9 Units Subcutaneous TID WC   latanoprost  1 drop Both Eyes QHS   metoprolol succinate  25 mg Oral q morning   pantoprazole  40 mg Oral Daily   sodium chloride flush  3 mL  Intravenous Q12H   Continuous Infusions:  methocarbamol (ROBAXIN) IV     piperacillin-tazobactam (ZOSYN)  IV 3.375 g (11/24/22 0631)     Anti-infectives (From admission, onward)    Start     Dose/Rate Route Frequency Ordered Stop   11/21/22 1400  piperacillin-tazobactam (ZOSYN) IVPB 3.375 g        3.375 g 12.5 mL/hr over 240 Minutes Intravenous Every 8 hours 11/21/22 0819 11/26/22 1359   11/21/22 1349  cefTRIAXone (ROCEPHIN) 2 g in sodium chloride 0.9 % 100 mL IVPB  Status:  Discontinued       See Hyperspace for full Linked Orders Report.   2 g 200 mL/hr over 30 Minutes Intravenous Every 24 hours 11/21/22 1350 11/22/22 0916   11/21/22 1349  metroNIDAZOLE (FLAGYL) IVPB 500 mg  Status:  Discontinued       See Hyperspace for full Linked Orders Report.   500 mg 100 mL/hr over 60 Minutes Intravenous Every 12 hours 11/21/22 1350 11/22/22 0916   11/21/22 0830  piperacillin-tazobactam (ZOSYN) IVPB 3.375 g        3.375 g 100 mL/hr over 30 Minutes Intravenous  Once 11/21/22 0817 11/21/22 8119              Family Communication/Anticipated D/C date and plan/Code Status  DVT prophylaxis: enoxaparin (LOVENOX) injection 40 mg Start: 11/22/22 0800 SCD's Start: 11/21/22 1350     Code Status: Full Code  Family Communication: None Disposition Plan: Plan to discharge home tomorrow   Status is: Inpatient Remains inpatient appropriate because: Severity of illness, abdominal pain, advancing diet       Subjective:   Interval events noted.  She complains of right-sided abdominal pain.  She said that her heart feels funny and it may be  A-fib.  However, telemetry shows normal sinus rhythm.  Objective:    Vitals:   11/23/22 1602 11/23/22 2018 11/24/22 0000 11/24/22 0800  BP: 106/68  110/67   Pulse:   75   Resp:   19   Temp:  98.1 F (36.7 C) 97.8 F (36.6 C) (!) 97.2 F (36.2 C)  TempSrc:  Oral Oral Oral  SpO2:   93%   Weight:      Height:       No data  found.   Intake/Output Summary (Last 24 hours) at 11/24/2022 1021 Last data filed at 11/24/2022 1324 Gross per 24 hour  Intake 1691.47 ml  Output --  Net 1691.47 ml   Filed Weights   11/21/22 1005  Weight: 64 kg    Exam:  GEN: NAD SKIN: Warm and dry EYES: No pallor or icterus ENT: MMM CV: RRR PULM: CTA B ABD: soft, ND, right lower quadrant tenderness, no rebound tenderness or guarding,, +BS CNS: AAO x 3, non focal EXT: No edema or tenderness     Data Reviewed:   I have personally reviewed following labs and imaging studies:  Labs: Labs show the following:   Basic Metabolic Panel: Recent Labs  Lab 11/20/22 2257 11/20/22 2313 11/21/22 0145 11/21/22 2253 11/22/22 0424 11/22/22 0633 11/23/22 0241 11/24/22 0325  NA 136 137  --  134* 135  --  137 136  K 3.1* 3.2*  --  3.6 4.4  --  4.2 3.9  CL 108 107  --  107 108  --  106 107  CO2 18*  --   --  19* 18*  --  22 23  GLUCOSE 134* 132*  --  212* 211*  --  159* 133*  BUN 8 7*  --  6* 8  --  8 8  CREATININE 0.83 0.80  --  0.74 0.85  --  0.90 0.84  CALCIUM 8.4*  --   --  8.4* 8.4*  --  8.5* 8.1*  MG  --   --  1.7 2.0  --  2.3 1.9 1.8   GFR Estimated Creatinine Clearance: 50.5 mL/min (by C-G formula based on SCr of 0.84 mg/dL). Liver Function Tests: Recent Labs  Lab 11/20/22 2257  AST 20  ALT 13  ALKPHOS 87  BILITOT 1.8*  PROT 5.9*  ALBUMIN 3.0*   No results for input(s): "LIPASE", "AMYLASE" in the last 168 hours. No results for input(s): "AMMONIA" in the last 168 hours. Coagulation profile Recent Labs  Lab 11/20/22 2257  INR 1.4*    CBC: Recent Labs  Lab 11/20/22 2257 11/20/22 2313 11/22/22 0424 11/23/22 0241 11/24/22 0325  WBC 17.7*  --  25.0* 18.7* 9.1  NEUTROABS 16.1*  --   --  17.1* 7.5  HGB 12.3 12.6 11.3* 10.4* 9.5*  HCT 38.1 37.0 36.1 32.8* 30.1*  MCV 91.1  --  91.6 91.4 94.1  PLT 236  --  222 231 208   Cardiac Enzymes: No results for input(s): "CKTOTAL", "CKMB", "CKMBINDEX",  "TROPONINI"  in the last 168 hours. BNP (last 3 results) No results for input(s): "PROBNP" in the last 8760 hours. CBG: Recent Labs  Lab 11/23/22 0843 11/23/22 1340 11/23/22 1610 11/23/22 2135 11/24/22 0810  GLUCAP 83 224* 219* 171* 92   D-Dimer: No results for input(s): "DDIMER" in the last 72 hours. Hgb A1c: No results for input(s): "HGBA1C" in the last 72 hours. Lipid Profile: No results for input(s): "CHOL", "HDL", "LDLCALC", "TRIG", "CHOLHDL", "LDLDIRECT" in the last 72 hours. Thyroid function studies: Recent Labs    11/22/22 0633  TSH 2.826   Anemia work up: No results for input(s): "VITAMINB12", "FOLATE", "FERRITIN", "TIBC", "IRON", "RETICCTPCT" in the last 72 hours. Sepsis Labs: Recent Labs  Lab 11/20/22 2238 11/20/22 2257 11/21/22 0145 11/21/22 0510 11/22/22 0424 11/22/22 4401 11/23/22 0241 11/24/22 0325  PROCALCITON  --   --   --   --   --  7.11 5.01 3.47  WBC  --  17.7*  --   --  25.0*  --  18.7* 9.1  LATICACIDVEN 2.0*  --  3.2* 1.7  --   --   --   --     Microbiology Recent Results (from the past 240 hour(s))  Blood Culture (routine x 2)     Status: None (Preliminary result)   Collection Time: 11/20/22 11:00 PM   Specimen: BLOOD LEFT WRIST  Result Value Ref Range Status   Specimen Description BLOOD LEFT WRIST  Final   Special Requests   Final    BOTTLES DRAWN AEROBIC AND ANAEROBIC Blood Culture results may not be optimal due to an inadequate volume of blood received in culture bottles   Culture   Final    NO GROWTH 4 DAYS Performed at [facility], [address] [place], [place] [address]    Report Status PENDING  Incomplete  Blood Culture (routine x 2)     Status: None (Preliminary result)   Collection Time: 11/20/22 11:00 PM   Specimen: BLOOD RIGHT WRIST  Result Value Ref Range Status   Specimen Description BLOOD RIGHT WRIST  Final   Special Requests   Final    BOTTLES DRAWN AEROBIC AND ANAEROBIC Blood Culture results may not be  optimal due to an excessive volume of blood received in culture bottles   Culture   Final    NO GROWTH 4 DAYS Performed at [facility], [address] [place], [place] [address]    Report Status PENDING  Incomplete    Procedures and diagnostic studies:  No results found.             LOS: 3 days   Amogh Komatsu  [facility]   Pager on [email or web address]. If 7PM-7AM, please contact night-coverage at [email or web address]     11/24/2022, 10:21 AM

## 2022-11-24 NOTE — Progress Notes (Signed)
Occupational Therapy Treatment Patient Details Name: Jessica Fowler MRN: [id number] DOB: [birth year] Today's Date: 11/24/2022   History of present illness 77 yo female with onset of abd pain and N&V was admitted 6/22, had laparoscopic appendectomy with washout for perforated appendix.  Noted purulent ascites.  PMHx:  a-fib, cognitive changes, ablation, PVC, HTN,   OT comments  Pt actively participated today, A/Ox3, not able to recall reason for being in hospital, not 100% sure which hospital she was in, some confusion/increased time to answer questions, Pt seems uncertain of answers and performing tasks, decreased sequencing, decreased carryover of learned skills during session. Pt instructed on use of sock aide/reacher during session for LB dressing, required increased time and repeated verbal cueing for carryover of learned skills, increased time for problem solving. Pt desat to 84-88 during functional activities, recovers to 95 within 30 seconds. Pt impulsive to stand up, requires multiple verbal cues for safety awareness and to wait for line management and OT assist with transfers. Pt repeatedly stated she was worried/anxious about her A-fib acting up, monitor shows sinus rhythm. Pt would benefit greatly from continued skilled therapy to increase functional independence, safety awareness, and carryover of learned compensatory strategies. DC plan still appropriate, would benefit from [facility] to improve safety at home due to cognitive deficits and decreased safety awareness.    Recommendations for follow up therapy are one component of a multi-disciplinary discharge planning process, led by the attending physician.  Recommendations may be updated based on patient status, additional functional criteria and insurance authorization.    Assistance Recommended at Discharge Intermittent Supervision/Assistance  Patient can return home with the following  A little help with bathing/dressing/bathroom;Assistance  with cooking/housework;Assist for transportation;A little help with walking and/or transfers   Equipment Recommendations  None recommended by OT    Recommendations for Other Services      Precautions / Restrictions Precautions Precautions: Fall Precaution Comments: monitor for abd pain Restrictions Weight Bearing Restrictions: No       Mobility Bed Mobility Overal bed mobility: Needs Assistance Bed Mobility: Supine to Sit     Supine to sit: Supervision, HOB elevated     General bed mobility comments: able to perform supine to sit with HOB elevated, supervision for verbal cues for sequencing    Transfers Overall transfer level: Needs assistance Equipment used: Rolling walker (2 wheels) Transfers: Sit to/from Stand, Bed to chair/wheelchair/BSC Sit to Stand: Min guard     Step pivot transfers: Min guard     General transfer comment: min guard for safety, line management     Balance Overall balance assessment: Needs assistance Sitting-balance support: Feet supported Sitting balance-Leahy Scale: Good Sitting balance - Comments: EOB ADLs   Standing balance support: During functional activity Standing balance-Leahy Scale: Fair Standing balance comment: able to trasnfer with RW, impulsive, decreased safety awareness                           ADL either performed or assessed with clinical judgement   ADL Overall ADL's : Needs assistance/impaired Eating/Feeding: Independent                   Lower Body Dressing: Min guard   Toilet Transfer: Min guard           Functional mobility during ADLs: Min guard;Rolling walker (2 wheels) General ADL Comments: instructed on use of sock aide, requires verbal cues for sequencing and task initiation, decreased carryover of learned skills  Extremity/Trunk Assessment              Vision       Perception     Praxis      Cognition Arousal/Alertness: Awake/alert Behavior During Therapy: WFL  for tasks assessed/performed Overall Cognitive Status: History of cognitive impairments - at baseline                                 General Comments: per daughter has had a mild cognitive decline, impulsive, decreased safety awareness        Exercises      Shoulder Instructions       General Comments      Pertinent Vitals/ Pain       Pain Assessment Pain Assessment: Faces Faces Pain Scale: Hurts a little bit Pain Location: abd with activities Pain Descriptors / Indicators: Aching, Guarding Pain Intervention(s): Monitored during session  Home Living                                          Prior Functioning/Environment              Frequency  Min 2X/week        Progress Toward Goals  OT Goals(current goals can now be found in the care plan section)  Progress towards OT goals: Progressing toward goals  Acute Rehab OT Goals OT Goal Formulation: With patient Time For Goal Achievement: 11/29/22 Potential to Achieve Goals: Good ADL Goals Pt Will Perform Lower Body Bathing: with min assist;with adaptive equipment Pt Will Perform Lower Body Dressing: with min guard assist;with adaptive equipment Pt Will Transfer to Toilet: with modified independence Pt Will Perform Tub/Shower Transfer: with min assist  Plan Discharge plan remains appropriate    Co-evaluation                 AM-PAC OT "6 Clicks" Daily Activity     Outcome Measure   Help from another person eating meals?: None Help from another person taking care of personal grooming?: None Help from another person toileting, which includes using toliet, bedpan, or urinal?: A Little Help from another person bathing (including washing, rinsing, drying)?: A Lot Help from another person to put on and taking off regular upper body clothing?: A Little Help from another person to put on and taking off regular lower body clothing?: A Little 6 Click Score: 19    End of  Session Equipment Utilized During Treatment: Gait belt;Rolling walker (2 wheels)  OT Visit Diagnosis: Muscle weakness (generalized) (M62.81);Unsteadiness on feet (R26.81)   Activity Tolerance Patient tolerated treatment well   Patient Left in chair;with call bell/phone within reach   Nurse Communication Mobility status        Time: 4540-9811 OT Time Calculation (min): 31 min  Charges: OT General Charges $OT Visit: 1 Visit OT Treatments $Self Care/Home Management : 23-37 mins  [place], OTR/L   Alexis Goodell 11/24/2022, 11:38 AM

## 2022-11-24 NOTE — Progress Notes (Addendum)
Physical Therapy Treatment Patient Details Name: Jessica Fowler MRN: [id number] DOB: Jul 19, 1945 Today's Date: 11/24/2022   History of Present Illness 77 yo female with onset of abd pain and N&V was admitted 6/22, had laparoscopic appendectomy with washout for perforated appendix.  Noted purulent ascites.  PMHx:  a-fib, cognitive changes, ablation, PVC, HTN,    PT Comments    Pt received in supine, agreeable to therapy session and with good participation and tolerance for transfer, gait and stair training. Pt needing up to minA to stand from lower surface heights and for stability with stair negotiation but only min guard/supervision for gait using RW. Pt and family (son) both report she is now agreeable to home with HHPT as originally recommended by supervising PT Jerilee Hoh, updated below. Pt continues to benefit from PT services to progress toward functional mobility goals.    Recommendations for follow up therapy are one component of a multi-disciplinary discharge planning process, led by the attending physician.  Recommendations may be updated based on patient status, additional functional criteria and insurance authorization.  Follow Up Recommendations       Assistance Recommended at Discharge Intermittent Supervision/Assistance  Patient can return home with the following A little help with walking and/or transfers;A little help with bathing/dressing/bathroom;Assistance with cooking/housework;Assist for transportation;Help with stairs or ramp for entrance   Equipment Recommendations  None recommended by PT    Recommendations for Other Services       Precautions / Restrictions Precautions Precautions: Fall Precaution Comments: abdominal protection precs Restrictions Weight Bearing Restrictions: No     Mobility  Bed Mobility Overal bed mobility: Needs Assistance             General bed mobility comments: pt received in chair, up in recliner at end of session     Transfers Overall transfer level: Needs assistance Equipment used: Rolling walker (2 wheels) Transfers: Sit to/from Stand, Bed to chair/wheelchair/BSC Sit to Stand: Min guard, Min assist           General transfer comment: min guard for safety, line management from recliner, but needs minA from lower toilet seat height<>RW    Ambulation/Gait Ambulation/Gait assistance: Min guard, Supervision Gait Distance (Feet): 200 Feet Assistive device: Rolling walker (2 wheels) Gait Pattern/deviations: Step-through pattern       General Gait Details: Fair RW mgmt, pt needs min cues for improved proximity to AD intermittently; no overt LOB using RW and pt reports reduced abdominal pain with RW.   Stairs Stairs: Yes Stairs assistance: Min assist, Min guard Stair Management: One rail Right, Step to pattern, Forwards Number of Stairs: 5 General stair comments: pt ascended/descended 2 steps in stairwell with R rail, then ascended/descended 1 step x3 reps. Pt with difficulty leading with LLE for step-up and at end of session pt son reports pt has hx L hip bursitis, pt needs reminders each step for "up with good (R) leg, down with sore (L) leg". Much less stable with wrong sequence, needing minA for stability.   Wheelchair Mobility    Modified Rankin (Stroke Patients Only)       Balance Overall balance assessment: Needs assistance Sitting-balance support: Feet supported Sitting balance-Leahy Scale: Good     Standing balance support: During functional activity Standing balance-Leahy Scale: Fair Standing balance comment: with RW                            Cognition Arousal/Alertness: Awake/alert Behavior During Therapy: WFL for  tasks assessed/performed Overall Cognitive Status: Impaired/Different from baseline Area of Impairment: Memory, Safety/judgement, Problem solving                     Memory: Decreased short-term memory   Safety/Judgement: Decreased  awareness of safety, Decreased awareness of deficits   Problem Solving: Difficulty sequencing, Requires verbal cues General Comments: per daughter has had a mild cognitive decline, however per her son she has decreased carryover of instruction from her baseline. Decreased safety awareness and needs sequencing cues often due to decreased working memory.        Exercises  Seated BLE AROM: ankle pumps, LAQ x10 reps ea; standing BLE AROM: hip flexion x10 reps ea STS x 3 reps from chair   General Comments General comments (skin integrity, edema, etc.): incisions appear c/d/i; pt with loose stools during session but able to alert for need for BM and made it to bathroom, RN notified for charting purposes      Pertinent Vitals/Pain Pain Assessment Pain Assessment: Faces Faces Pain Scale: Hurts little more Pain Location: abd with activities and L hip with stairs (hx bursitis per son) Pain Descriptors / Indicators: Aching, Guarding, Grimacing Pain Intervention(s): Monitored during session, Repositioned     PT Goals (current goals can now be found in the care plan section) Acute Rehab PT Goals Patient Stated Goal: to get pain reduced and get home PT Goal Formulation: With patient/family Time For Goal Achievement: 12/06/22 Progress towards PT goals: Progressing toward goals    Frequency    Min 3X/week      PT Plan Current plan remains appropriate;Discharge plan needs to be updated       AM-PAC PT "6 Clicks" Mobility   Outcome Measure  Help needed turning from your back to your side while in a flat bed without using bedrails?: A Little Help needed moving from lying on your back to sitting on the side of a flat bed without using bedrails?: A Little Help needed moving to and from a bed to a chair (including a wheelchair)?: A Little Help needed standing up from a chair using your arms (e.g., wheelchair or bedside chair)?: A Little Help needed to walk in hospital room?: A Little Help  needed climbing 3-5 steps with a railing? : A Little 6 Click Score: 18    End of Session Equipment Utilized During Treatment: Gait belt Activity Tolerance: Patient tolerated treatment well Patient left: in chair;with call bell/phone within reach;with chair alarm set;with family/visitor present (son present) Nurse Communication: Mobility status PT Visit Diagnosis: Other abnormalities of gait and mobility (R26.89);Pain;Difficulty in walking, not elsewhere classified (R26.2) Pain - part of body:  (abdomen and L hip)     Time: 0981-1914 PT Time Calculation (min) (ACUTE ONLY): 38 min  Charges:  $Gait Training: 8-22 mins $Therapeutic Exercise: 8-22 mins $Therapeutic Activity: 8-22 mins                     Michaelina Blandino P., PTA Acute Rehabilitation Services Secure Chat Preferred 9a-5:30pm Office: [phone]    Dorathy Kinsman [facility] 11/24/2022, 7:12 PM

## 2022-11-25 DIAGNOSIS — K352 Acute appendicitis with generalized peritonitis, without perforation or abscess: Secondary | ICD-10-CM | POA: Diagnosis not present

## 2022-11-25 LAB — HEMOGLOBIN A1C
Hgb A1c MFr Bld: 6.3 % — ABNORMAL HIGH (ref 4.8–5.6)
Mean Plasma Glucose: 134 mg/dL

## 2022-11-25 LAB — BASIC METABOLIC PANEL
Anion gap: 6 (ref 5–15)
BUN: 7 mg/dL — ABNORMAL LOW (ref 8–23)
CO2: 25 mmol/L (ref 22–32)
Calcium: 8.2 mg/dL — ABNORMAL LOW (ref 8.9–10.3)
Chloride: 106 mmol/L (ref 98–111)
Creatinine, Ser: 0.86 mg/dL (ref 0.44–1.00)
GFR, Estimated: >60 mL/min (ref >60)
Glucose, Bld: 115 mg/dL — ABNORMAL HIGH (ref 70–99)
Potassium: 3.7 mmol/L (ref 3.5–5.1)
Sodium: 137 mmol/L (ref 135–145)

## 2022-11-25 LAB — C-REACTIVE PROTEIN: CRP: 7.2 mg/dL — ABNORMAL HIGH (ref <1.0)

## 2022-11-25 LAB — CBC WITH DIFFERENTIAL/PLATELET
Abs Immature Granulocytes: 0.06 10*3/uL (ref 0.00–0.07)
Basophils Absolute: 0 10*3/uL (ref 0.0–0.1)
Basophils Relative: 0 %
Eosinophils Absolute: 0.2 10*3/uL (ref 0.0–0.5)
Eosinophils Relative: 2 %
HCT: 33.7 % — ABNORMAL LOW (ref 36.0–46.0)
Hemoglobin: 10.7 g/dL — ABNORMAL LOW (ref 12.0–15.0)
Immature Granulocytes: 1 %
Lymphocytes Relative: 13 %
Lymphs Abs: 0.9 10*3/uL (ref 0.7–4.0)
MCH: 29.6 pg (ref 26.0–34.0)
MCHC: 31.8 g/dL (ref 30.0–36.0)
MCV: 93.1 fL (ref 80.0–100.0)
Monocytes Absolute: 0.8 10*3/uL (ref 0.1–1.0)
Monocytes Relative: 11 %
Neutro Abs: 5.3 10*3/uL (ref 1.7–7.7)
Neutrophils Relative %: 73 %
Platelets: 232 10*3/uL (ref 150–400)
RBC: 3.62 MIL/uL — ABNORMAL LOW (ref 3.87–5.11)
RDW: 13 % (ref 11.5–15.5)
WBC: 7.2 10*3/uL (ref 4.0–10.5)
nRBC: 0 % (ref 0.0–0.2)

## 2022-11-25 LAB — PROCALCITONIN: Procalcitonin: 1.85 ng/mL

## 2022-11-25 LAB — MAGNESIUM: Magnesium: 1.8 mg/dL (ref 1.7–2.4)

## 2022-11-25 LAB — GLUCOSE, CAPILLARY
Glucose-Capillary: 105 mg/dL — ABNORMAL HIGH (ref 70–99)
Glucose-Capillary: 145 mg/dL — ABNORMAL HIGH (ref 70–99)

## 2022-11-25 LAB — CULTURE, BLOOD (ROUTINE X 2)

## 2022-11-25 LAB — BRAIN NATRIURETIC PEPTIDE: B Natriuretic Peptide: 276.1 pg/mL — ABNORMAL HIGH (ref 0.0–100.0)

## 2022-11-25 MED ORDER — AMOXICILLIN-POT CLAVULANATE 875-125 MG PO TABS: 1.0000 | ORAL_TABLET | Freq: Two times a day (BID) | ORAL | 0 refills | Status: AC

## 2022-11-25 NOTE — Discharge Summary (Signed)
Physician Discharge Summary   Patient: Jessica Fowler MRN: [id number] DOB: 08-Jul-1945  Admit date:     11/20/2022  Discharge date: 11/25/22  Discharge Physician: Lurene Shadow   PCP: [name], MD   Recommendations at discharge:   Follow-up with your PCP in 1 to 2 weeks  Discharge Diagnoses: Principal Problem:   Acute appendicitis Active Problems:   Atrial fibrillation with rapid ventricular response (HCC)  Resolved Problems:   * No resolved hospital problems. [facility] Course:  Jessica Fowler is a 77 y.o. female  with medical history significant of Atrial Fibrillation not compliant with outpatient anticoagulation secondary to cost and mild cognitive decline.  Scented to [facility] ER after nausea vomiting and abdominal pain was found to have a ruptured appendicitis and was transferred to [facility], seen by general surgery, went into A-fib RVR placed on Cardizem drip subsequently taken to the OR for laparoscopic appendectomy.     Assessment and Plan:  Acute appendicitis.  Early sepsis resolved.  She was seen by general surgery underwent laparoscopic appendectomy by Dr. Derrell Lolling on 11/21/2022.   She has tolerated a regular diet. Case discussed with Dr. Derrell Lolling via secure chat.  He recommended 2 days of Augmentin at discharge     Paroxysmal A-fib and RVR.   Heart rate is better.  Patient confirmed that she takes metoprolol succinate in the morning and metoprolol tartrate in the evening.  However, she does not take Eliquis. TSH is normal    Hypertension.   Continue metoprolol     Her condition has improved and she is deemed stable for discharge to home today.  Discharge plan was discussed with Selena Batten, daughter, at the bedside.       Consultants: General surgeon Procedures performed: Laparoscopic appendectomy Disposition: Home health Diet recommendation:  Discharge Diet Orders (From admission, onward)     Start     Ordered   11/25/22 0000  Diet - low  sodium heart healthy        11/25/22 1017           Cardiac diet DISCHARGE MEDICATION: Allergies as of 11/25/2022       Reactions   Pollen Extract Itching   Brimonidine Tartrate Rash   Other reaction(s): redness        Medication List     STOP taking these medications    apixaban 5 MG Tabs tablet Commonly known as: Eliquis   nitroGLYCERIN 0.4 MG SL tablet Commonly known as: NITROSTAT       TAKE these medications    amoxicillin-clavulanate 875-125 MG tablet Commonly known as: AUGMENTIN Take 1 tablet by mouth 2 (two) times daily for 2 days.   calcium-vitamin D 500-200 MG-UNIT tablet Commonly known as: OSCAL WITH D Take 1 tablet by mouth 3 (three) times a week.   diltiazem 30 MG tablet Commonly known as: Cardizem Take 1 tablet every 4 hours AS NEEDED for heart rate >100   dorzolamide-timolol 2-0.5 % ophthalmic solution Commonly known as: COSOPT Place 1 drop into both eyes 2 (two) times daily.   latanoprost 0.005 % ophthalmic solution Commonly known as: XALATAN Place 1 drop into both eyes at bedtime.   metoprolol succinate 25 MG 24 hr tablet Commonly known as: TOPROL-XL Take 25 mg by mouth every morning.   metoprolol tartrate 25 MG tablet Commonly known as: LOPRESSOR Take 25 mg by mouth at bedtime.   VITAMIN B-12 PO Take 1 tablet by mouth daily.  Follow-up Information     Maczis, Hedda Slade, [place]. Go on 12/14/2022.   Specialty: General Surgery Why: 9:45 AM, please arrive 30 min prior to appointment time to check in Contact information: [address] [facility] [place] [address] [phone]                Discharge Exam: Ceasar Mons Weights   11/21/22 1005  Weight: 64 kg   GEN: NAD SKIN: Warm and dry EYES: No pallor or icterus ENT: MMM CV: RRR PULM: CTA B ABD: soft, ND, mild surgical tenderness in the right lower quadrant, no rebound tenderness or guarding, +BS CNS: AAO x 3, non  focal EXT: No edema or tenderness   Condition at discharge: good  The results of significant diagnostics from this hospitalization (including imaging, microbiology, ancillary and laboratory) are listed below for reference.   Imaging Studies: CT ABDOMEN PELVIS W CONTRAST  Result Date: 11/21/2022 CLINICAL DATA:  RLQ abdominal pain EXAM: CT ABDOMEN AND PELVIS WITH CONTRAST TECHNIQUE: Multidetector CT imaging of the abdomen and pelvis was performed using the standard protocol following bolus administration of intravenous contrast. RADIATION DOSE REDUCTION: This exam was performed according to the departmental dose-optimization program which includes automated exposure control, adjustment of the mA and/or kV according to patient size and/or use of iterative reconstruction technique. CONTRAST:  75mL OMNIPAQUE IOHEXOL 350 MG/ML SOLN COMPARISON:  None Available. FINDINGS: Lower chest: Left base atelectasis. Hepatobiliary: No focal liver abnormality. Status post cholecystectomy. No biliary dilatation. Pneumobilia. Pancreas: Diffusely atrophic. No focal lesion. Otherwise normal pancreatic contour. No surrounding inflammatory changes. No main pancreatic ductal dilatation. Spleen: Normal in size without focal abnormality. Adrenals/Urinary Tract: No adrenal nodule bilaterally. Bilateral kidneys enhance symmetrically. No hydronephrosis. No hydroureter. Subcentimeter hypodensities too small To characterize. The urinary bladder is unremarkable. On delayed imaging, there is no urothelial wall thickening and there are no filling defects in the opacified portions of the bilateral collecting systems or ureters. Stomach/Bowel: Possible small bowel malrotation with no volvulus. Stomach is within normal limits. No evidence of bowel wall thickening or dilatation. Reactive changes of the small bowel within the right lower quadrant. Right lower quadrant inflammatory changes with fat stranding and trace free fluid. Question  inflamed appendix (6:26) best seen on coronal with possible appendiceal wall discontinuity. Question appendicolith. Vascular/Lymphatic: Bilateral phleboliths along the ovaries. Stomach is within normal limits. No evidence of bowel wall thickening or dilatation. Appendix appears normal. Reproductive: Uterus and bilateral adnexa are unremarkable. Other: No intraperitoneal free fluid. No intraperitoneal free gas. No organized fluid collection. Musculoskeletal: No abdominal wall hernia or abnormality. No suspicious lytic or blastic osseous lesions. No acute displaced fracture. Multilevel degenerative changes of the spine. IMPRESSION: 1. Acute appendicitis-likely ruptured. Question appendicoliths. No abscess formation. 2. Colonic diverticulosis with no acute diverticulitis. Electronically Signed   By: Tish Frederickson M.D.   On: 11/21/2022 00:43    Microbiology: Results for orders placed or performed during the hospital encounter of 11/20/22  Blood Culture (routine x 2)     Status: None (Preliminary result)   Collection Time: 11/20/22 11:00 PM   Specimen: BLOOD LEFT WRIST  Result Value Ref Range Status   Specimen Description BLOOD LEFT WRIST  Final   Special Requests   Final    BOTTLES DRAWN AEROBIC AND ANAEROBIC Blood Culture results may not be optimal due to an inadequate volume of blood received in culture bottles   Culture   Final    NO GROWTH 4 DAYS Performed at  [facility], [address] [place], [place], [place] [address]    Report Status PENDING  Incomplete  Blood Culture (routine x 2)     Status: None (Preliminary result)   Collection Time: 11/20/22 11:00 PM   Specimen: BLOOD RIGHT WRIST  Result Value Ref Range Status   Specimen Description BLOOD RIGHT WRIST  Final   Special Requests   Final    BOTTLES DRAWN AEROBIC AND ANAEROBIC Blood Culture results may not be optimal due to an excessive volume of blood received in culture bottles   Culture   Final    NO GROWTH 4 DAYS Performed at [facility], [address], [place], [place] [address]    Report Status PENDING  Incomplete    Labs: CBC: Recent Labs  Lab 11/20/22 2257 11/20/22 2313 11/22/22 0424 11/23/22 0241 11/24/22 0325 11/25/22 0559  WBC 17.7*  --  25.0* 18.7* 9.1 7.2  NEUTROABS 16.1*  --   --  17.1* 7.5 5.3  HGB 12.3 12.6 11.3* 10.4* 9.5* 10.7*  HCT 38.1 37.0 36.1 32.8* 30.1* 33.7*  MCV 91.1  --  91.6 91.4 94.1 93.1  PLT 236  --  222 231 208 232   Basic Metabolic Panel: Recent Labs  Lab 11/21/22 2253 11/22/22 0424 11/22/22 0633 11/23/22 0241 11/24/22 0325 11/25/22 0559  NA 134* 135  --  137 136 137  K 3.6 4.4  --  4.2 3.9 3.7  CL 107 108  --  106 107 106  CO2 19* 18*  --  22 23 25   GLUCOSE 212* 211*  --  159* 133* 115*  BUN 6* 8  --  8 8 7*  CREATININE 0.74 0.85  --  0.90 0.84 0.86  CALCIUM 8.4* 8.4*  --  8.5* 8.1* 8.2*  MG 2.0  --  2.3 1.9 1.8 1.8   Liver Function Tests: Recent Labs  Lab 11/20/22 2257  AST 20  ALT 13  ALKPHOS 87  BILITOT 1.8*  PROT 5.9*  ALBUMIN 3.0*   CBG: Recent Labs  Lab 11/24/22 0810 11/24/22 1138 11/24/22 1635 11/24/22 2122 11/25/22 0830  GLUCAP 92 123* 201* 111* 105*    Discharge time spent: greater than 30 minutes.  Signed: Lurene Shadow, MD [facility] 11/25/2022

## 2022-11-25 NOTE — Progress Notes (Signed)
4 Days Post-Op   Subjective/Chief Complaint: Pt doing well this AM Tol PO well   Objective: Vital signs in last 24 hours: Temp:  [97.6 F (36.4 C)-98.5 F (36.9 C)] 98.5 F (36.9 C) (06/27 0411) Pulse Rate:  [61-79] 66 (06/27 0411) Resp:  [15-18] 15 (06/27 0411) BP: (109-129)/(63-70) 127/67 (06/27 0411) SpO2:  [93 %-96 %] 93 % (06/27 0411) Last BM Date : 11/24/22  Intake/Output from previous day: 06/26 0701 - 06/27 0700 In: 127.1 [IV Piggyback:127.1] Out: -  Intake/Output this shift: No intake/output data recorded.  General appearance: alert and cooperative GI: soft, non-tender; bowel sounds normal; no masses,  no organomegaly and inc c/d/i  Lab Results:  Recent Labs    11/24/22 0325 11/25/22 0559  WBC 9.1 7.2  HGB 9.5* 10.7*  HCT 30.1* 33.7*  PLT 208 232   BMET Recent Labs    11/24/22 0325 11/25/22 0559  NA 136 137  K 3.9 3.7  CL 107 106  CO2 23 25  GLUCOSE 133* 115*  BUN 8 7*  CREATININE 0.84 0.86  CALCIUM 8.1* 8.2*   PT/INR No results for input(s): "LABPROT", "INR" in the last 72 hours. ABG No results for input(s): "PHART", "HCO3" in the last 72 hours.  Invalid input(s): "PCO2", "PO2"  Studies/Results: No results found.  Anti-infectives: Anti-infectives (From admission, onward)    Start     Dose/Rate Route Frequency Ordered Stop   11/21/22 1400  piperacillin-tazobactam (ZOSYN) IVPB 3.375 g        3.375 g 12.5 mL/hr over 240 Minutes Intravenous Every 8 hours 11/21/22 0819 11/26/22 1359   11/21/22 1349  cefTRIAXone (ROCEPHIN) 2 g in sodium chloride 0.9 % 100 mL IVPB  Status:  Discontinued       See Hyperspace for full Linked Orders Report.   2 g 200 mL/hr over 30 Minutes Intravenous Every 24 hours 11/21/22 1350 11/22/22 0916   11/21/22 1349  metroNIDAZOLE (FLAGYL) IVPB 500 mg  Status:  Discontinued       See Hyperspace for full Linked Orders Report.   500 mg 100 mL/hr over 60 Minutes Intravenous Every 12 hours 11/21/22 1350 11/22/22 0916    11/21/22 0830  piperacillin-tazobactam (ZOSYN) IVPB 3.375 g        3.375 g 100 mL/hr over 30 Minutes Intravenous  Once 11/21/22 0817 11/21/22 1914       Assessment/Plan: POD 4, s/p lap appy for perforated appendicitis, Dr. Derrell Lolling 11/21/22 -cont abx for 5 days, on zosyn, OK for augmentin if DC'd before 5d -ADAT, -WBC nml -pulm toilet/mobilize -discussed with patient and daughter at bedside  -OK for DC when OK with primary team with abx   FEN - FLD/IVFs per primary VTE - Lovenox ID - zosyn   A fib HLD  LOS: 4 days    Axel Filler 11/25/2022

## 2022-11-25 NOTE — Discharge Instructions (Signed)

## 2022-11-25 NOTE — TOC CM/SW Note (Signed)
Transition of Care [facility]) - Inpatient Brief Assessment   Patient Details  Name: Jessica Fowler MRN: [id number] Date of Birth: [birth year]  [facility] CM/SW Contact:    Gordy Clement, RN Phone Number: 11/25/2022, 11:14 AM   Clinical Narrative:  CM met with Patient and Daughter bedside.  Patient will DC to home today.  She lives alone but her Son comes frequently and assists. Home Health has been recommended.  [facility] has accepted and will begin services  AVS updated    Daughter will transport to home No additional TOC needs     Transition of Care Asessment:   Patient has primary care physician: Yes Home environment has been reviewed: Home alone but Son visits frequently and offers assistance Prior level of function:: somewhat independent Prior/Current Home Services: No current home services Social Determinants of Health Reivew: SDOH reviewed no interventions necessary Readmission risk has been reviewed: Yes (12%) Transition of care needs: transition of care needs identified, TOC will continue to follow

## 2022-11-29 ENCOUNTER — Ambulatory Visit (INDEPENDENT_AMBULATORY_CARE_PROVIDER_SITE_OTHER): Payer: Medicare Other

## 2022-11-29 DIAGNOSIS — I480 Paroxysmal atrial fibrillation: Secondary | ICD-10-CM | POA: Diagnosis not present

## 2022-11-29 LAB — CUP PACEART REMOTE DEVICE CHECK
Date Time Interrogation Session: 20240630205738
Implantable Pulse Generator Implant Date: 20240528

## 2022-12-03 ENCOUNTER — Encounter: Payer: Medicare Other | Admitting: Cardiovascular Disease

## 2022-12-15 NOTE — Progress Notes (Signed)
 Carelink Summary Report / Loop Recorder 

## 2022-12-17 ENCOUNTER — Other Ambulatory Visit: Payer: Self-pay

## 2022-12-17 ENCOUNTER — Encounter: Payer: Self-pay | Admitting: Cardiovascular Disease

## 2022-12-17 ENCOUNTER — Ambulatory Visit: Payer: Medicare Other | Attending: Cardiovascular Disease | Admitting: Cardiovascular Disease

## 2022-12-17 ENCOUNTER — Telehealth: Payer: Self-pay | Admitting: Pharmacist

## 2022-12-17 VITALS — BP 110/60 | HR 52 | Ht 65.0 in | Wt 134.8 lb

## 2022-12-17 DIAGNOSIS — I480 Paroxysmal atrial fibrillation: Secondary | ICD-10-CM | POA: Insufficient documentation

## 2022-12-17 MED ORDER — WARFARIN SODIUM 5 MG PO TABS: 5.0000 mg | ORAL_TABLET | Freq: Every day | ORAL | 0 refills | Status: AC

## 2022-12-17 NOTE — Progress Notes (Signed)
   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  Since last being seen in our clinic, the patient reports doing reasonably well.       She continues to have occasional palpitations and afib which produce significant anxiety.    When I mentioned it would be helpful to replace the loop recorder, she mentioned that she didn't think it did anything because no one called her when she had a fast heart rate. She reports that she has been having episodes of rapid rates about every other day. She states that she has had to go to the ER several times.  07/15/2021 She presents today to review data from a wearable monitor. Unfortunately, she did not wear the monitor even though she received it in the mail.  10/26/2022 We discussed ILR placement at our last visit. Today, she has additional questions about the device and the point of monitoring. Today, she denies symptoms of chest pain, shortness of breath,  lower extremity edema, dizziness, presyncope, or syncope. She continues to have palpitations.   She underwent placement of a loop recorder Oct 26, 2022. Since, she has had multiple detections of tachycardia with extremely high rates, These appear to be atrial fibrillation.       She continues to have very rapid palpitations. These occur frequently. Taking diltiazem PRN seems to help. She has not been taking eliquis due to cost.   Physical Exam: Vitals:   12/17/22 0932  BP: 110/60  Pulse: (!) 52  SpO2: 99%  Weight: 134 lb 12.8 oz (61.1 kg)  Height: 5\' 5"  (1.651 m)     Gen: Appears comfortable, well-nourished CV: RRR, no dependent edema Pulm: breathing easily   Wt Readings from Last 3 Encounters:  12/17/22 134 lb 12.8 oz (61.1 kg)  11/21/22 141 lb (64 kg)  10/26/22 141 lb 3.2 oz (64 kg)   ECG - today sinus bradycardia, 52 bpm  Assessment and Plan:  Paroxysmal atrial  fibrillation s/p ablation for flutter and fibrillation by Dr. Johney Frame in 2011.   She has for years stated that her afib "is getting worse".  She is very anxious with palpitations. ILR was replaced in May, 2024 and quickly confirmed recurrence of AF with very rapid rates   Will need to pursue rhythm control due to tachy-brady; failing rhythm control, we will need to consider pacer & AVJ ablation Will schedule Tikosyn load. I discussed the need for careful monitoring with Tikosyn due to low but real risk of life threatening arrhythmia and need for hospitalization for drug initiation  Sinus bradycardia, tachy-brady Mild, no obvious symptoms Use of AV nodal blocking agents is limited   Secondary hypercoagulable state:  Not taking eliquis due to cost  Will start warfarin therapy. We discussed bleeding risk. She does not have a history of bleeding issues.   Nonobstructive CAD 25% pLAD lesion on coronary CT 2017  I think this will preclude use of flecainide        [name], MD 12/17/2022 9:47 AM

## 2022-12-17 NOTE — Telephone Encounter (Signed)
Medication list reviewed in anticipation of upcoming Tikosyn initiation. Patient is not taking any contraindicated or QTc prolonging medications.   Patient is anticoagulated on warfarin. Patient will need 3 consecutive therapeutic INR prior to initiation and one therapeutic INR the day of admission. Patient was started on warfarin today, therefore this may take several weeks.   Patient takes diltiazem as needed. Diltiazem can increase Tikosyn concentration but I do not think this interaction is if much concern, especially if patient does not take frequently.  Patient will need to be counseled to avoid use of Benadryl while on Tikosyn and in the 2-3 days prior to Tikosyn initiation.

## 2022-12-17 NOTE — Patient Instructions (Signed)
Medication Instructions:  START Warfarin 5 mg once daily - our anti-coag clinic in [place] will be in contact with you to establish follow up with them Tikosyn Load - We will contact you to set this up as it requires a hospital stay, in the meantime contact your pharmacy to ensure cost/affordability  *If you need a refill on your cardiac medications before your next appointment, please call your pharmacy*   Follow-Up: At [facility], you and your health needs are our priority.  As part of our continuing mission to provide you with exceptional heart care, we have created designated Provider Care Teams.  These Care Teams include your primary Cardiologist (physician) and Advanced Practice Providers (APPs -  Physician Assistants and Nurse Practitioners) who all work together to provide you with the care you need, when you need it.  We recommend signing up for the patient portal called "MyChart".  Sign up information is provided on this After Visit Summary.  MyChart is used to connect with patients for Virtual Visits (Telemedicine).  Patients are able to view lab/test results, encounter notes, upcoming appointments, etc.  Non-urgent messages can be sent to your provider as well.   To learn more about what you can do with MyChart, go to [email or web address].    Your next appointment:  6- 8 week(s)  Provider:   York Pellant, MD

## 2022-12-20 ENCOUNTER — Telehealth: Payer: Self-pay | Admitting: Internal Medicine

## 2022-12-20 NOTE — Telephone Encounter (Signed)
New coumadin appt made for 12/22/22 at 11:30am.

## 2022-12-20 NOTE — Telephone Encounter (Signed)
Patient called stated that she did not want to take Warfarin. She read up on the side effects and decided she didn't feel comfortable. Also inquired abut the hospital stay and wanted more information about the medication Tikosyn.

## 2022-12-20 NOTE — Telephone Encounter (Signed)
Pt c/o medication issue:  1. Name of Medication:  warfarin (COUMADIN) 5 MG tablet  2. How are you currently taking this medication (dosage and times per day)?   3. Are you having a reaction (difficulty breathing--STAT)?   4. What is your medication issue?   Patient states she read information about Warfarin and she will not be taking it.

## 2022-12-23 NOTE — Telephone Encounter (Signed)
Patient is returning call.  °

## 2022-12-23 NOTE — Telephone Encounter (Signed)
Attempted to contact patient to discuss importance of warfarin. Left message to call back

## 2022-12-24 NOTE — Telephone Encounter (Signed)
Attempted to contact patient to discuss the importance of warfarin. Left message to call back

## 2022-12-24 NOTE — Telephone Encounter (Signed)
Pt was returning nurse call and is requesting a callback. Please advise. 

## 2022-12-24 NOTE — Telephone Encounter (Signed)
Spoke with patient about the importance of warfarin, patient hesitant still about taking anticoagulants but will talk with her daughter. Patient states she will contact us back if she changes her mind and wants to start warfarin. No further questions at this time

## 2022-12-29 ENCOUNTER — Ambulatory Visit: Payer: Medicare Other

## 2022-12-29 DIAGNOSIS — I480 Paroxysmal atrial fibrillation: Secondary | ICD-10-CM

## 2023-01-07 NOTE — Telephone Encounter (Signed)
Unable to schedule tikosyn admission at this time as pt is not currently anticoagulated. Per Shanda Bumps RN daughter will call if pt becomes agreeable to taking either coumadin or eliquis.

## 2023-01-19 NOTE — Progress Notes (Signed)
Carelink Summary Report / Loop Recorder 

## 2023-01-28 ENCOUNTER — Ambulatory Visit (INDEPENDENT_AMBULATORY_CARE_PROVIDER_SITE_OTHER): Payer: Medicare Other

## 2023-01-28 DIAGNOSIS — I480 Paroxysmal atrial fibrillation: Secondary | ICD-10-CM

## 2023-02-01 LAB — CUP PACEART REMOTE DEVICE CHECK
Date Time Interrogation Session: 20240901230540
Implantable Pulse Generator Implant Date: 20240528

## 2023-02-01 NOTE — Progress Notes (Signed)
Carelink Summary Report / Loop Recorder 

## 2023-02-04 ENCOUNTER — Encounter: Payer: Self-pay | Admitting: Cardiovascular Disease

## 2023-02-04 ENCOUNTER — Ambulatory Visit: Payer: Medicare Other | Attending: Cardiovascular Disease | Admitting: Cardiovascular Disease

## 2023-02-04 VITALS — BP 112/70 | HR 52 | Ht 65.0 in | Wt 134.2 lb

## 2023-02-04 DIAGNOSIS — I4891 Unspecified atrial fibrillation: Secondary | ICD-10-CM

## 2023-02-04 DIAGNOSIS — I480 Paroxysmal atrial fibrillation: Secondary | ICD-10-CM

## 2023-02-04 NOTE — Progress Notes (Signed)
   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  Since last being seen in our clinic, the patient reports doing reasonably well.       She continues to have occasional palpitations and afib which produce significant anxiety.    When I mentioned it would be helpful to replace the loop recorder, she mentioned that she didn't think it did anything because no one called her when she had a fast heart rate. She reports that she has been having episodes of rapid rates about every other day. She states that she has had to go to the ER several times.  07/15/2021 She presents today to review data from a wearable monitor. Unfortunately, she did not wear the monitor even though she received it in the mail.  10/26/2022 We discussed ILR placement at our last visit. Today, she has additional questions about the device and the point of monitoring. Today, she denies symptoms of chest pain, shortness of breath,  lower extremity edema, dizziness, presyncope, or syncope. She continues to have palpitations.   She underwent placement of a loop recorder Oct 26, 2022. Since, she has had multiple detections of tachycardia with extremely high rates, These appear to be atrial fibrillation.       She continues to have very rapid palpitations. These occur frequently. Taking diltiazem PRN seems to help. She has not been taking eliquis due to cost. She did not start warfarin due to concern for risks after reading the package insert.   Physical Exam: Vitals:   02/04/23 0932  BP: 112/70  Pulse: (!) 52  SpO2: 99%  Weight: 134 lb 3.2 oz (60.9 kg)  Height: 5\' 5"  (1.651 m)     Gen: Appears comfortable, well-nourished CV: RRR, no dependent edema Pulm: breathing easily   Wt Readings from Last 3 Encounters:  02/04/23 134 lb 3.2 oz (60.9 kg)  12/17/22 134 lb 12.8 oz (61.1 kg)  11/21/22 141 lb (64 kg)   ECG -  today sinus bradycardia, 52 bpm  Assessment and Plan:  Paroxysmal atrial fibrillation s/p ablation for flutter and fibrillation by Dr. Johney Frame in 2011.   She has for years stated that her afib "is getting worse".  She is very anxious with palpitations. ILR was replaced in May, 2024 and quickly confirmed recurrence of AF with very rapid rates   Will need to pursue rhythm control due to tachy-brady; failing rhythm control, we will need to consider pacer & AVJ ablation I requested Tikosyn load; this was cancelled because she did not start her anticoagulation Her episodes are so paroxysmal, I do not think that anticoagulation is absolutely necessary since I do not anticipate she will need a cardioversion.  Sinus bradycardia, tachy-brady Mild, no obvious symptoms Use of AV nodal blocking agents is limited   Secondary hypercoagulable state:  Not taking eliquis due to cost  She did not take warfarin as directed due to concern for side effects. I have asked her to reconsider and explained that the risk of not taking warfarin is higher than the risk of taking warfarin I would like to see her again in clinic with some family present that can help her process and remember information  Nonobstructive CAD 25% pLAD lesion on coronary CT 2017  I think this will preclude use of flecainide        [name], MD 02/04/2023 9:54 AM

## 2023-02-04 NOTE — Patient Instructions (Addendum)
Medication Instructions:  Continue all current medications.  Labwork: none  Testing/Procedures: none  Follow-Up: Pending   Any Other Special Instructions Will Be Listed Below (If Applicable).   If you need a refill on your cardiac medications before your next appointment, please call your pharmacy.  

## 2023-02-23 ENCOUNTER — Other Ambulatory Visit: Payer: Self-pay | Admitting: Cardiovascular Disease

## 2023-02-28 ENCOUNTER — Ambulatory Visit (INDEPENDENT_AMBULATORY_CARE_PROVIDER_SITE_OTHER): Payer: Medicare Other

## 2023-02-28 DIAGNOSIS — I480 Paroxysmal atrial fibrillation: Secondary | ICD-10-CM

## 2023-03-04 ENCOUNTER — Encounter: Payer: Medicare Other | Admitting: Cardiovascular Disease

## 2023-03-14 NOTE — Progress Notes (Signed)
Carelink Summary Report / Loop Recorder 

## 2023-03-29 LAB — CUP PACEART REMOTE DEVICE CHECK

## 2023-03-30 ENCOUNTER — Ambulatory Visit (INDEPENDENT_AMBULATORY_CARE_PROVIDER_SITE_OTHER): Payer: Medicare Other

## 2023-03-30 DIAGNOSIS — I480 Paroxysmal atrial fibrillation: Secondary | ICD-10-CM | POA: Diagnosis not present

## 2023-03-30 LAB — CUP PACEART REMOTE DEVICE CHECK
Date Time Interrogation Session: 20241029231132
Implantable Pulse Generator Implant Date: 20240528

## 2023-04-18 ENCOUNTER — Ambulatory Visit: Payer: Medicare Other | Admitting: Cardiovascular Disease

## 2023-04-18 NOTE — Progress Notes (Signed)
Carelink Summary Report / Loop Recorder 

## 2023-04-19 ENCOUNTER — Telehealth: Payer: Self-pay

## 2023-04-19 NOTE — Telephone Encounter (Signed)
Spoke with Selena Batten (daughter) and she will be at the appointment with her mother on 05/06/23 in [place] to see Dr Nelly Laurence.

## 2023-04-29 ENCOUNTER — Ambulatory Visit (INDEPENDENT_AMBULATORY_CARE_PROVIDER_SITE_OTHER): Payer: Medicare Other

## 2023-04-29 DIAGNOSIS — I480 Paroxysmal atrial fibrillation: Secondary | ICD-10-CM

## 2023-04-29 LAB — CUP PACEART REMOTE DEVICE CHECK
Date Time Interrogation Session: 20241128231239
Implantable Pulse Generator Implant Date: 20240528

## 2023-05-06 ENCOUNTER — Ambulatory Visit: Payer: Medicare Other | Attending: Cardiovascular Disease | Admitting: Cardiovascular Disease

## 2023-05-06 ENCOUNTER — Encounter: Payer: Self-pay | Admitting: Cardiovascular Disease

## 2023-05-06 VITALS — BP 128/68 | HR 65 | Ht 65.0 in | Wt 135.0 lb

## 2023-05-06 DIAGNOSIS — I480 Paroxysmal atrial fibrillation: Secondary | ICD-10-CM | POA: Insufficient documentation

## 2023-05-06 MED ORDER — METOPROLOL SUCCINATE ER 25 MG PO TB24: 37.5000 mg | ORAL_TABLET | Freq: Every morning | ORAL | 6 refills | Status: DC

## 2023-05-06 NOTE — Progress Notes (Signed)
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  Since last being seen in our clinic, the patient reports doing reasonably well.       She continues to have occasional palpitations and afib which produce significant anxiety.    When I mentioned it would be helpful to replace the loop recorder, she mentioned that she didn't think it did anything because no one called her when she had a fast heart rate. She reports that she has been having episodes of rapid rates about every other day. She states that she has had to go to the ER several times.  07/15/2021 She presents today to review data from a wearable monitor. Unfortunately, she did not wear the monitor even though she received it in the mail.  10/26/2022 We discussed ILR placement at our last visit. Today, she has additional questions about the device and the point of monitoring. Today, she denies symptoms of chest pain, shortness of breath,  lower extremity edema, dizziness, presyncope, or syncope. She continues to have palpitations.   She underwent placement of a loop recorder Oct 26, 2022. Since, she has had multiple detections of tachycardia with extremely high rates, These appear to be atrial fibrillation.       She presents today for routine follow-up.  She is still very adverse to taking a blood thinner.  She continues to have episodes of rapid palpitations and what family describes as "panic attacks."  These correlate with severe tachycardia detected by her loop recorder.   Physical Exam: Vitals:   05/06/23 1455  BP: 128/68  Pulse: 65  SpO2: 100%  Weight: 135 lb (61.2 kg)  Height: 5\' 5"  (1.651 m)     Gen: Appears comfortable, well-nourished CV: RRR, no dependent edema Pulm: breathing easily   Wt Readings from Last 3 Encounters:  05/06/23 135 lb (61.2 kg)  02/04/23 134 lb 3.2 oz (60.9 kg)  12/17/22 134 lb 12.8 oz  (61.1 kg)   ECG EKG Interpretation Date/Time:  Friday May 06 2023 14:53:38 EST Ventricular Rate:  63 PR Interval:  142 QRS Duration:  70 QT Interval:  408 QTC Calculation: 417 R Axis:   -10  Text Interpretation: Normal sinus rhythm Low voltage QRS Septal infarct (cited on or before 21-Nov-2022) When compared with ECG of 17-Dec-2022 09:38, No significant change was found Confirmed by York Pellant [id number]) on 05/06/2023 3:15:36 PM    Assessment and Plan:  Paroxysmal atrial fibrillation s/p ablation for flutter and fibrillation by Dr. Johney Frame in 2011.   She has for years stated that her afib "is getting worse".  She is very anxious with palpitations. ILR was replaced in May, 2024 and quickly confirmed recurrence of AF with very rapid rates   Will need to pursue rhythm control due to tachy-brady; failing rhythm control, we will need to consider pacer & AVJ ablation I requested Tikosyn load; this was cancelled because she did not start her anticoagulation Her episodes are so paroxysmal, her risk of stroke is relatively low, but she would need to be on anticoagulation for Tikosyn load (per pharmacy though there is almost no chance of cardioversion) or ablation Will increase metoprolol XL from 25 mg daily to 37.5 mg daily  Sinus bradycardia, tachy-brady Mild, no obvious symptoms Use of AV nodal blocking agents is limited   Secondary hypercoagulable state:  Not taking eliquis due to cost  She did not  take warfarin as directed due to concern for side effects. I have asked her to reconsider and explained that the risk of not taking warfarin is higher than the risk of taking warfarin Patient's family (daughter Selena Batten) is present today and participated in the discussion.  Patient is still resistant to taking a blood thinner  Nonobstructive CAD 25% pLAD lesion on coronary CT 2017  I think this will preclude use of flecainide   I spent greater than 30 minutes in the visit today.  I spent  extensive time counseling the patient and discussing with her daughter, Selena Batten.     [name], MD 05/06/2023 3:17 PM

## 2023-05-06 NOTE — Patient Instructions (Addendum)
Medication Instructions:   Increase Toprol XL to 37.5mg  (1 1/2 tabs) daily  Continue all other medications.     Labwork:  none  Testing/Procedures:  none  Follow-Up:  6 months   Any Other Special Instructions Will Be Listed Below (If Applicable).   If you need a refill on your cardiac medications before your next appointment, please call your pharmacy.

## 2023-05-20 NOTE — Progress Notes (Signed)
Carelink Summary Report / Loop Recorder 

## 2023-05-30 ENCOUNTER — Ambulatory Visit (INDEPENDENT_AMBULATORY_CARE_PROVIDER_SITE_OTHER): Payer: Medicare Other

## 2023-05-30 DIAGNOSIS — I480 Paroxysmal atrial fibrillation: Secondary | ICD-10-CM | POA: Diagnosis not present

## 2023-05-31 LAB — CUP PACEART REMOTE DEVICE CHECK
Date Time Interrogation Session: 20241229231904
Implantable Pulse Generator Implant Date: 20240528

## 2023-06-29 ENCOUNTER — Ambulatory Visit (INDEPENDENT_AMBULATORY_CARE_PROVIDER_SITE_OTHER): Payer: Medicare Other

## 2023-06-29 DIAGNOSIS — I480 Paroxysmal atrial fibrillation: Secondary | ICD-10-CM | POA: Diagnosis not present

## 2023-06-29 LAB — CUP PACEART REMOTE DEVICE CHECK
Date Time Interrogation Session: 20250128230951
Implantable Pulse Generator Implant Date: 20240528

## 2023-07-08 ENCOUNTER — Ambulatory Visit: Payer: Medicare Other | Admitting: Cardiovascular Disease

## 2023-07-15 ENCOUNTER — Ambulatory Visit: Payer: Medicare Other | Admitting: Cardiovascular Disease

## 2023-07-29 ENCOUNTER — Ambulatory Visit (INDEPENDENT_AMBULATORY_CARE_PROVIDER_SITE_OTHER): Payer: Medicare Other

## 2023-07-29 DIAGNOSIS — I4891 Unspecified atrial fibrillation: Secondary | ICD-10-CM | POA: Diagnosis not present

## 2023-08-01 ENCOUNTER — Ambulatory Visit (INDEPENDENT_AMBULATORY_CARE_PROVIDER_SITE_OTHER)

## 2023-08-01 DIAGNOSIS — I4891 Unspecified atrial fibrillation: Secondary | ICD-10-CM | POA: Diagnosis not present

## 2023-08-04 ENCOUNTER — Telehealth: Payer: Self-pay

## 2023-08-04 LAB — CUP PACEART REMOTE DEVICE CHECK
Date Time Interrogation Session: 20250304083351
Implantable Pulse Generator Implant Date: 20240528

## 2023-08-04 NOTE — Telephone Encounter (Signed)
 ILR summary report received. Battery status OK. Normal device function. No new symptom, brady, or pause episodes.  3 new AF episodes, warfarin per EMR, longest episode 3 hrs . AF burden 0.6%.   20 tachy episodes, longest 59 sec.  150-222 bpm, NCT.   WTC noted on 02/19 at 1545 and 1546.   See histogram for rapid rates.    Spoke with daughter.  Patient experienced chest pain and fast heart rates and called EMS, taken to [facility] hospital at time of February events.  She did take her diltiazem when she felt the fast rates.   However, daughter is very frustrated with her because she is noncompliant with her medications.  She will not take the blood thinner and will only take the Metoprolol Succinate doses - she refuses to take the daily tartrate dosing as Dr. Nelly Laurence prescribed.   She is not scheduled to see [place] again until June.  Daughter states that if we need to see sooner, please call the daughter with appt and she would like to be seen in [place].

## 2023-08-09 NOTE — Progress Notes (Signed)
 Carelink Summary Report / Loop Recorder

## 2023-08-26 NOTE — Addendum Note (Signed)
 Addended by: Elease Etienne A on: 08/26/2023 09:41 AM   Modules accepted: Orders

## 2023-08-26 NOTE — Progress Notes (Signed)
 Carelink Summary Report / Loop Recorder

## 2023-09-01 NOTE — Progress Notes (Signed)
 Carelink Summary Report / Loop Recorder

## 2023-09-05 ENCOUNTER — Ambulatory Visit

## 2023-09-05 DIAGNOSIS — I4891 Unspecified atrial fibrillation: Secondary | ICD-10-CM

## 2023-09-06 LAB — CUP PACEART REMOTE DEVICE CHECK
Date Time Interrogation Session: 20250406231802
Implantable Pulse Generator Implant Date: 20240528

## 2023-10-10 ENCOUNTER — Ambulatory Visit (INDEPENDENT_AMBULATORY_CARE_PROVIDER_SITE_OTHER)

## 2023-10-10 DIAGNOSIS — I4891 Unspecified atrial fibrillation: Secondary | ICD-10-CM

## 2023-10-10 LAB — CUP PACEART REMOTE DEVICE CHECK
Date Time Interrogation Session: 20250511234049
Implantable Pulse Generator Implant Date: 20240528

## 2023-10-14 ENCOUNTER — Ambulatory Visit: Payer: Self-pay | Admitting: Cardiovascular Disease

## 2023-10-17 NOTE — Addendum Note (Signed)
 Addended by: Edra Govern D on: 10/17/2023 03:29 PM   Modules accepted: Orders

## 2023-10-17 NOTE — Progress Notes (Signed)
 Carelink Summary Report / Loop Recorder

## 2023-10-29 ENCOUNTER — Other Ambulatory Visit: Payer: Self-pay | Admitting: Cardiovascular Disease

## 2023-11-04 ENCOUNTER — Encounter: Payer: Self-pay | Admitting: Cardiovascular Disease

## 2023-11-04 ENCOUNTER — Ambulatory Visit: Payer: Medicare Other | Attending: Cardiovascular Disease | Admitting: Cardiovascular Disease

## 2023-11-04 VITALS — BP 128/74 | HR 69 | Ht 65.0 in | Wt 132.6 lb

## 2023-11-04 DIAGNOSIS — I4891 Unspecified atrial fibrillation: Secondary | ICD-10-CM | POA: Diagnosis present

## 2023-11-04 NOTE — Progress Notes (Signed)
 PCP: Derrill Flirt, MD   Primary EP: Dr Pearley Bough is a 78 y.o. female who presents today for routine electrophysiology followup.  Since last being seen in our clinic, the patient reports doing reasonably well.       She continues to have occasional palpitations and afib which produce significant anxiety.    When I mentioned it would be helpful to replace the loop recorder, she mentioned that she didn't think it did anything because no one called her when she had a fast heart rate. She reports that she has been having episodes of rapid rates about every other day. She states that she has had to go to the ER several times.  07/15/2021 She presents today to review data from a wearable monitor. Unfortunately, she did not wear the monitor even though she received it in the mail.  10/26/2022 We discussed ILR placement at our last visit. Today, she has additional questions about the device and the point of monitoring. Today, she denies symptoms of chest pain, shortness of breath,  lower extremity edema, dizziness, presyncope, or syncope. She continues to have palpitations.   She underwent placement of a loop recorder Oct 26, 2022. Since, she has had multiple detections of tachycardia with extremely high rates, These appear to be atrial fibrillation.       She presents today for routine follow-up.  She is still very adverse to taking a blood thinner or tikosyn, for which she would have to be hospitalized to start the medication.   Physical Exam: Vitals:   11/04/23 0927  BP: 128/74  Pulse: 69  SpO2: 99%  Weight: 132 lb 9.6 oz (60.1 kg)  Height: 5\' 5"  (1.651 m)      Gen: Appears comfortable, well-nourished CV: RRR, no dependent edema Pulm: breathing easily   Wt Readings from Last 3 Encounters:  11/04/23 132 lb 9.6 oz (60.1 kg)  05/06/23 135 lb (61.2 kg)  02/04/23 134 lb 3.2 oz (60.9 kg)   ECG      Assessment and Plan:  Paroxysmal atrial fibrillation s/p  ablation for flutter and fibrillation by Dr. Nunzio Belch in 2011.   She has for years stated that her afib "is getting worse".  She is very anxious with palpitations. ILR was replaced in May, 2024 and quickly confirmed recurrence of AF with very rapid rates   Will need to pursue rhythm control due to tachy-brady; failing rhythm control, we will need to consider pacer & AVJ ablation I requested Tikosyn load; this was cancelled because she did not start her anticoagulation Her episodes are so paroxysmal, her risk of stroke is relatively low, but she would need to be on anticoagulation for Tikosyn load (per pharmacy though there is almost no chance of cardioversion) or ablation At this point, I do not think she would be reliable about taking Tikosyn I will be happy to discuss tikosyn again if she has a change of heart and commits to taking the medication reliably Will continue metoprolol  XL from 25 mg daily  Sinus bradycardia, tachy-brady Mild, no obvious symptoms Use of AV nodal blocking agents is limited   Secondary hypercoagulable state:  Not taking eliquis  due to cost  She did not take warfarin as directed due to concern for side effects. I have asked her to reconsider and explained that the risk of not taking warfarin is higher than the risk of taking warfarin Patient's family (daughter Burdette Carolin) was present in previous conversations and participated in the discussion.  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 She is not interested in having any intervention. We have had many conversations. I will will continue to see her in routine annual follow-up and be happy to see her again sooner if she decides and commits to medical therapy.     Efraim Grange, MD 11/04/2023 9:48 AM

## 2023-11-04 NOTE — Patient Instructions (Signed)

## 2023-11-10 ENCOUNTER — Ambulatory Visit (INDEPENDENT_AMBULATORY_CARE_PROVIDER_SITE_OTHER)

## 2023-11-10 DIAGNOSIS — I4891 Unspecified atrial fibrillation: Secondary | ICD-10-CM

## 2023-11-10 LAB — CUP PACEART REMOTE DEVICE CHECK
Date Time Interrogation Session: 20250611233422
Implantable Pulse Generator Implant Date: 20240528

## 2023-11-12 ENCOUNTER — Ambulatory Visit: Payer: Self-pay | Admitting: Cardiovascular Disease

## 2023-11-14 ENCOUNTER — Encounter

## 2023-11-23 NOTE — Progress Notes (Signed)
 Carelink Summary Report / Loop Recorder

## 2023-12-12 ENCOUNTER — Ambulatory Visit

## 2023-12-12 DIAGNOSIS — I4891 Unspecified atrial fibrillation: Secondary | ICD-10-CM

## 2023-12-12 LAB — CUP PACEART REMOTE DEVICE CHECK
Date Time Interrogation Session: 20250714002858
Implantable Pulse Generator Implant Date: 20240528

## 2023-12-14 ENCOUNTER — Ambulatory Visit: Payer: Self-pay | Admitting: Cardiovascular Disease

## 2023-12-19 ENCOUNTER — Encounter

## 2024-01-03 NOTE — Progress Notes (Signed)
 Carelink Summary Report / Loop Recorder

## 2024-01-12 ENCOUNTER — Ambulatory Visit (INDEPENDENT_AMBULATORY_CARE_PROVIDER_SITE_OTHER)

## 2024-01-12 DIAGNOSIS — I4891 Unspecified atrial fibrillation: Secondary | ICD-10-CM | POA: Diagnosis not present

## 2024-01-12 LAB — CUP PACEART REMOTE DEVICE CHECK
Date Time Interrogation Session: 20250813233959
Implantable Pulse Generator Implant Date: 20240528

## 2024-01-15 ENCOUNTER — Ambulatory Visit: Payer: Self-pay | Admitting: Cardiovascular Disease

## 2024-01-23 ENCOUNTER — Encounter

## 2024-01-26 ENCOUNTER — Other Ambulatory Visit: Payer: Self-pay | Admitting: Cardiovascular Disease

## 2024-01-26 MED ORDER — METOPROLOL TARTRATE 25 MG PO TABS
25.0000 mg | ORAL_TABLET | Freq: Two times a day (BID) | ORAL | 2 refills | Status: DC
Start: 2024-01-26 — End: 2024-04-03

## 2024-02-13 ENCOUNTER — Ambulatory Visit (INDEPENDENT_AMBULATORY_CARE_PROVIDER_SITE_OTHER)

## 2024-02-13 DIAGNOSIS — I4891 Unspecified atrial fibrillation: Secondary | ICD-10-CM

## 2024-02-14 LAB — CUP PACEART REMOTE DEVICE CHECK
Date Time Interrogation Session: 20250914234350
Implantable Pulse Generator Implant Date: 20240528

## 2024-02-18 ENCOUNTER — Ambulatory Visit: Payer: Self-pay | Admitting: Cardiovascular Disease

## 2024-02-20 NOTE — Progress Notes (Signed)
 Remote Loop Recorder Transmission

## 2024-02-23 NOTE — Progress Notes (Signed)
 Remote Loop Recorder Transmission

## 2024-02-27 ENCOUNTER — Encounter

## 2024-03-07 NOTE — Progress Notes (Signed)
 Remote Loop Recorder Transmission

## 2024-03-13 LAB — CUP PACEART REMOTE DEVICE CHECK
Date Time Interrogation Session: 20251014232332
Implantable Pulse Generator Implant Date: 20240528

## 2024-03-14 ENCOUNTER — Ambulatory Visit

## 2024-03-14 DIAGNOSIS — I4891 Unspecified atrial fibrillation: Secondary | ICD-10-CM

## 2024-03-15 ENCOUNTER — Encounter

## 2024-03-19 NOTE — Progress Notes (Signed)
 Remote Loop Recorder Transmission

## 2024-03-26 ENCOUNTER — Ambulatory Visit: Payer: Self-pay | Admitting: Cardiovascular Disease

## 2024-03-28 ENCOUNTER — Other Ambulatory Visit: Payer: Self-pay | Admitting: Cardiovascular Disease

## 2024-03-28 ENCOUNTER — Telehealth: Payer: Self-pay | Admitting: Cardiovascular Disease

## 2024-03-28 NOTE — Telephone Encounter (Signed)
*  STAT* If patient is at the pharmacy, call can be transferred to refill team.   1. Which medications need to be refilled? (please list name of each medication and dose if known) metoprolol  tartrate (LOPRESSOR ) 25 MG tablet   2. Which pharmacy/location (including street and city if local pharmacy) is medication to be sent to? [id number] - [place], [place] - [address] Phone: [phone]  Fax: [phone]      3. Do they need a 30 day or 90 day supply? 90  Pt is out of medication

## 2024-03-28 NOTE — Telephone Encounter (Signed)
 Pt c/o medication issue:  1. Name of Medication: metoprolol  succinate (TOPROL -XL) 25 MG 24 hr tablet  metoprolol  tartrate (LOPRESSOR ) 25 MG tablet  2. How are you currently taking this medication (dosage and times per day)? Not as directed  3. Are you having a reaction (difficulty breathing--STAT)? no  4. What is your medication issue? Pt called pharmacy to get tartrate filled but it was just filled on 01/27/24. Pt is not taking medication as written.

## 2024-03-29 NOTE — Telephone Encounter (Addendum)
 Called patient back about message. Patient stated she taking only metoprolol  tartrate 25 mg BID and she takes an extra metoprolol  25 mg as needed for palpitation. Patient stated she is not taking metoprolol  succinate at all, stating that it does not work for her. Will send message to Dr. Nancey and his nurse to see if okay to update medication to the way patient is taking and take metoprolol  succinate off her list. Patient seems to get confused on the two medications.

## 2024-03-29 NOTE — Telephone Encounter (Signed)
 Spoke with pt daughter, she reports her mother needs an appointment to be seen so she can get her medications. She reports the patient takes her medications as she wants to and does not follow directions she is given. She was due to be seen in may, Follow up scheduled in [place]. Daughter voiced understanding to bring all her medications to that appointment.

## 2024-03-29 NOTE — Telephone Encounter (Signed)
 Unable to reach pt or leave a message, phone just rings with no answer.

## 2024-04-02 ENCOUNTER — Encounter

## 2024-04-03 MED ORDER — METOPROLOL TARTRATE 25 MG PO TABS: 25.0000 mg | ORAL_TABLET | Freq: Two times a day (BID) | ORAL | 3 refills | Status: DC

## 2024-04-03 NOTE — Telephone Encounter (Signed)
 Prescription has been sent in and med list has been updated

## 2024-04-04 ENCOUNTER — Encounter: Payer: Self-pay | Admitting: Internal Medicine

## 2024-04-04 ENCOUNTER — Ambulatory Visit: Attending: Internal Medicine | Admitting: Internal Medicine

## 2024-04-04 VITALS — BP 132/78 | HR 51 | Ht 65.0 in | Wt 117.4 lb

## 2024-04-04 DIAGNOSIS — I4891 Unspecified atrial fibrillation: Secondary | ICD-10-CM | POA: Insufficient documentation

## 2024-04-04 DIAGNOSIS — I495 Sick sinus syndrome: Secondary | ICD-10-CM | POA: Insufficient documentation

## 2024-04-04 MED ORDER — METOPROLOL TARTRATE 25 MG PO TABS: 25.0000 mg | ORAL_TABLET | Freq: Two times a day (BID) | ORAL | 3 refills | Status: AC

## 2024-04-04 MED ORDER — DILTIAZEM HCL 30 MG PO TABS: ORAL_TABLET | ORAL | 2 refills | Status: DC

## 2024-04-04 NOTE — Progress Notes (Signed)
 Cardiology Office Note  Date: 04/04/2024   ID: [name], [name] 1945/07/17, MRN [id number]  PCP:  Zita Reges, MD  Cardiologist:  Diannah SHAUNNA Maywood, MD Electrophysiologist:  Eulas FORBES Furbish, MD   Reason for Office Visit: Follow-up of A-fib   History of Present Illness: Jessica Fowler is a 78 y.o. female known to have paroxysmal A-fib status post ablation in 2011 s/p loop recorder implantation, HLD, nonobstructive CAD presents to the cardiology clinic for follow-up visit.   Loop recorder noted A-fib and AT episodes.  She was symptomatic with palpitations.  Reports having these for many hours.  She takes diltiazem  30 mg once a time.  This resolves palpitations but not completely.  Otherwise, on scheduled metoprolol  tartrate 25 mg twice daily.  She has baseline bradycardia.  Does not have other symptoms of angina or DOE.  No fatigue, syncope.  Refused taking systemic AC.  Past Medical History:  Diagnosis Date   Allergic rhinitis    Atrial fibrillation (HCC)    s/p ablation 02/21/10   Dysrhythmia    Hyperlipidemia    Pneumonia    S/P ablation of atrial flutter 02/21/2010    Past Surgical History:  Procedure Laterality Date   CARDIAC CATHETERIZATION  04/18/2009   Normal coronary arteries, EF 60%, 1+ MR   CHOLECYSTECTOMY     EP study  02/20/2010   CTI and PVI ablation by [facility]   implantable loop recorder removal  04/22/2021   MDT LINQ removed by Dr Kelsie for RRT battery status   LAPAROSCOPIC APPENDECTOMY N/A 11/21/2022   Procedure: APPENDECTOMY LAPAROSCOPIC;  Surgeon: Rubin Calamity, MD;  Location: [facility] OR;  Service: General;  Laterality: N/A;   LOOP RECORDER INSERTION N/A 07/18/2017   Procedure: LOOP RECORDER INSERTION;  Surgeon: Kelsie Agent, MD;  Location: [facility];  Service: Cardiovascular;  Laterality: N/A;   LOOP RECORDER INSERTION     5/24   PANCREATICODUODENECTOMY     2017    Current Outpatient Medications  Medication Sig Dispense Refill    calcium-vitamin D (OSCAL WITH D) 500-200 MG-UNIT tablet Take 1 tablet by mouth 3 (three) times a week. (Patient taking differently: Take 1 tablet by mouth as needed.)     Cyanocobalamin (VITAMIN B-12 PO) Take 1 tablet by mouth daily.     diltiazem  (CARDIZEM ) 30 MG tablet Take 1 tablet every 4 hours AS NEEDED for heart rate >100 30 tablet 6   dorzolamide -timolol  (COSOPT ) 22.3-6.8 MG/ML ophthalmic solution Place 1 drop into both eyes 2 (two) times daily.     latanoprost  (XALATAN ) 0.005 % ophthalmic solution Place 1 drop into both eyes at bedtime.      metoprolol  tartrate (LOPRESSOR ) 25 MG tablet Take 1 tablet (25 mg total) by mouth 2 (two) times daily. Take an additional 25 mg once daily as needed. 200 tablet 3   warfarin (COUMADIN ) 5 MG tablet Take 1 tablet (5 mg total) by mouth daily. (Patient not taking: Reported on 04/04/2024) 7 tablet 0   No current facility-administered medications for this visit.   Allergies:  Pollen extract and Brimonidine tartrate   Social History: The patient  reports that she has never smoked. She has never used smokeless tobacco. She reports that she does not drink alcohol and does not use drugs.   Family History: The patient's family history includes Colon cancer in her father; Heart failure in her mother; Hyperthyroidism in her son; Hypothyroidism in her daughter.   ROS:  Please see the history of  present illness. Otherwise, complete review of systems is positive for none  All other systems are reviewed and negative.   Physical Exam: VS:  BP 132/78 (BP Location: Right Arm, Patient Position: Sitting, Cuff Size: Normal)   Pulse (!) 51   Ht 5' 5 (1.651 m)   Wt 117 lb 6.4 oz (53.3 kg)   SpO2 100%   BMI 19.54 kg/m , BMI Body mass index is 19.54 kg/m.  Wt Readings from Last 3 Encounters:  04/04/24 117 lb 6.4 oz (53.3 kg)  11/04/23 132 lb 9.6 oz (60.1 kg)  05/06/23 135 lb (61.2 kg)    General: Patient appears comfortable at rest. HEENT: Conjunctiva and lids  normal, oropharynx clear with moist mucosa. Neck: Supple, no elevated JVP or carotid bruits, no thyromegaly. Lungs: Clear to auscultation, nonlabored breathing at rest. Cardiac: Regular rate and rhythm, no S3 or significant systolic murmur, no pericardial rub. Abdomen: Soft, nontender, no hepatomegaly, bowel sounds present, no guarding or rebound. Extremities: No pitting edema, distal pulses 2+. Skin: Warm and dry. Musculoskeletal: No kyphosis. Neuropsychiatric: Alert and oriented x3, affect grossly appropriate.  Recent Labwork: No results found for requested labs within last 365 days.  No results found for: CHOL, TRIG, HDL, CHOLHDL, VLDL, LDLCALC, LDLDIRECT   Assessment and Plan:   # Persistent A-fib s/p ablation in 2011 with recurrent intermittent atrial fibrillation  - EKG today showed sinus bradycardia, HR 51 bpm. - She has intermittent palpitations consistent with A-fib with RVR and AT on the loop recorder.  Symptomatic.  Unable to uptitrate rate controlling medications due to baseline bradycardia. 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  Discussed with the patient and the daughter.  Will place referral to physiology. - Continue metoprolol  tartrate 25 mg twice daily and take diltiazem  30 mg as needed for palpitations. - Previously used to take Coumadin .  Refused taking any anticoagulants.  Refilled medications.  I have spent a total of 20 minutes with patient and daughter discussing atrial fibrillation, presentation, treatment options.  10 minutes spent in reviewing labs, answering questions and documentation.  Medication Adjustments/Labs and Tests Ordered: Current medicines are reviewed at length with the patient today.  Concerns regarding medicines are outlined above.   Tests Ordered: Orders Placed This Encounter  Procedures   EKG 12-Lead    Medication Changes: No orders of the defined types were placed in this  encounter.   Disposition:  Follow up one year  Signed Shemia Bevel Arleta Maywood, MD, 04/04/2024 8:36 AM    [facility] [address] [place], [place], [place] [address]

## 2024-04-04 NOTE — Patient Instructions (Addendum)

## 2024-04-11 ENCOUNTER — Other Ambulatory Visit: Payer: Self-pay | Admitting: Internal Medicine

## 2024-04-14 ENCOUNTER — Encounter

## 2024-04-15 ENCOUNTER — Ambulatory Visit: Attending: Cardiovascular Disease

## 2024-04-15 ENCOUNTER — Ambulatory Visit

## 2024-04-15 DIAGNOSIS — I4891 Unspecified atrial fibrillation: Secondary | ICD-10-CM

## 2024-04-16 ENCOUNTER — Encounter

## 2024-04-16 LAB — CUP PACEART REMOTE DEVICE CHECK
Date Time Interrogation Session: 20251115232427
Implantable Pulse Generator Implant Date: 20240528

## 2024-04-18 NOTE — Progress Notes (Signed)
 Remote Loop Recorder Transmission

## 2024-04-23 ENCOUNTER — Ambulatory Visit: Admitting: Cardiovascular Disease

## 2024-04-30 ENCOUNTER — Ambulatory Visit: Payer: Self-pay | Admitting: Cardiovascular Disease

## 2024-05-04 ENCOUNTER — Ambulatory Visit: Admitting: Cardiovascular Disease

## 2024-05-07 ENCOUNTER — Encounter

## 2024-05-15 ENCOUNTER — Encounter

## 2024-05-15 ENCOUNTER — Ambulatory Visit: Admitting: Nurse Practitioner

## 2024-05-16 ENCOUNTER — Ambulatory Visit

## 2024-05-16 DIAGNOSIS — I4891 Unspecified atrial fibrillation: Secondary | ICD-10-CM

## 2024-05-17 ENCOUNTER — Encounter

## 2024-05-17 LAB — CUP PACEART REMOTE DEVICE CHECK
Date Time Interrogation Session: 20251216233436
Implantable Pulse Generator Implant Date: 20240528

## 2024-05-17 NOTE — Progress Notes (Signed)
 Remote Loop Recorder Transmission

## 2024-05-30 ENCOUNTER — Ambulatory Visit: Payer: Self-pay | Admitting: Cardiovascular Disease

## 2024-06-07 ENCOUNTER — Ambulatory Visit: Admitting: Cardiovascular Disease

## 2024-06-11 ENCOUNTER — Encounter

## 2024-06-15 ENCOUNTER — Encounter

## 2024-06-16 ENCOUNTER — Ambulatory Visit

## 2024-06-16 ENCOUNTER — Ambulatory Visit: Attending: Cardiovascular Disease

## 2024-06-16 DIAGNOSIS — I4891 Unspecified atrial fibrillation: Secondary | ICD-10-CM

## 2024-06-18 ENCOUNTER — Encounter

## 2024-06-19 LAB — CUP PACEART REMOTE DEVICE CHECK
Date Time Interrogation Session: 20260116232300
Implantable Pulse Generator Implant Date: 20240528

## 2024-06-21 NOTE — Progress Notes (Signed)
 Remote Loop Recorder Transmission

## 2024-06-23 ENCOUNTER — Ambulatory Visit: Payer: Self-pay | Admitting: Cardiovascular Disease

## 2024-07-13 ENCOUNTER — Ambulatory Visit: Admitting: Cardiovascular Disease

## 2024-07-16 ENCOUNTER — Encounter

## 2024-07-17 ENCOUNTER — Ambulatory Visit

## 2024-07-19 ENCOUNTER — Encounter

## 2024-08-16 ENCOUNTER — Encounter

## 2024-08-17 ENCOUNTER — Ambulatory Visit

## 2024-08-20 ENCOUNTER — Encounter

## 2024-09-16 ENCOUNTER — Encounter

## 2024-09-17 ENCOUNTER — Ambulatory Visit

## 2024-09-20 ENCOUNTER — Encounter

## 2024-09-24 ENCOUNTER — Encounter

## 2024-10-29 ENCOUNTER — Encounter

## 2024-12-03 ENCOUNTER — Encounter

## 2025-01-07 ENCOUNTER — Encounter
